# Patient Record
Sex: Male | Born: 1950
Health system: Southern US, Community
[De-identification: ages and names within clinical notes are randomized; demographics above are authoritative.]

## PROBLEM LIST (undated history)

## (undated) DIAGNOSIS — M316 Other giant cell arteritis: Secondary | ICD-10-CM

## (undated) DIAGNOSIS — K209 Esophagitis, unspecified without bleeding: Secondary | ICD-10-CM

## (undated) DIAGNOSIS — I10 Essential (primary) hypertension: Secondary | ICD-10-CM

## (undated) DIAGNOSIS — B159 Hepatitis A without hepatic coma: Secondary | ICD-10-CM

## (undated) DIAGNOSIS — J40 Bronchitis, not specified as acute or chronic: Secondary | ICD-10-CM

## (undated) DIAGNOSIS — E785 Hyperlipidemia, unspecified: Secondary | ICD-10-CM

## (undated) DIAGNOSIS — R7301 Impaired fasting glucose: Secondary | ICD-10-CM

## (undated) DIAGNOSIS — K649 Unspecified hemorrhoids: Secondary | ICD-10-CM

## (undated) DIAGNOSIS — J329 Chronic sinusitis, unspecified: Secondary | ICD-10-CM

## (undated) HISTORY — DX: Unspecified hemorrhoids: K64.9

## (undated) HISTORY — DX: Chronic sinusitis, unspecified: J32.9

## (undated) HISTORY — DX: Hyperlipidemia, unspecified: E78.5

## (undated) HISTORY — PX: ANUS SURGERY: SHX302

## (undated) HISTORY — DX: Esophagitis, unspecified: K20.9

## (undated) HISTORY — DX: Essential (primary) hypertension: I10

## (undated) HISTORY — DX: Esophagitis, unspecified without bleeding: K20.90

## (undated) HISTORY — DX: Impaired fasting glucose: R73.01

## (undated) HISTORY — DX: Hepatitis a without hepatic coma: B15.9

---
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
## 1995-04-19 HISTORY — PX: ACHILLES TENDON SURGERY: SHX542

## 2012-11-14 ENCOUNTER — Ambulatory Visit: Payer: Self-pay | Admitting: Family Medicine

## 2012-11-16 ENCOUNTER — Other Ambulatory Visit: Payer: Self-pay | Admitting: Specialist

## 2014-01-09 LAB — HM COLONOSCOPY

## 2014-08-07 ENCOUNTER — Ambulatory Visit
Admit: 2014-08-07 | Disposition: A | Discharge status: Home / Self Care | Payer: Self-pay | Attending: Family Medicine | Admitting: Family Medicine

## 2014-08-08 ENCOUNTER — Ambulatory Visit
Admit: 2014-08-08 | Disposition: A | Discharge status: Home / Self Care | Payer: Self-pay | Attending: Family Medicine | Admitting: Family Medicine

## 2015-02-09 ENCOUNTER — Encounter: Payer: Self-pay | Admitting: Emergency Medicine

## 2015-02-09 ENCOUNTER — Ambulatory Visit
Admission: EM | Admit: 2015-02-09 | Discharge: 2015-02-09 | Disposition: A | Discharge status: Home / Self Care | Payer: BLUE CROSS/BLUE SHIELD | Attending: Family Medicine | Admitting: Family Medicine

## 2015-02-09 ENCOUNTER — Telehealth: Payer: Self-pay

## 2015-02-09 DIAGNOSIS — B349 Viral infection, unspecified: Secondary | ICD-10-CM | POA: Diagnosis not present

## 2015-02-09 NOTE — Telephone Encounter (Signed)
I am so sorry, I just now saw this at 4:19 pm; please let him know I hope he feels better, but I want him to go to urgent care NOW where they can do a stat chest xray and CBC

## 2015-02-09 NOTE — Telephone Encounter (Signed)
Dr. Sherie DonLada, please advise. You have a total of 9 patients for this afternoon.

## 2015-02-09 NOTE — ED Provider Notes (Signed)
CSN: [id number]     Arrival date & time 02/09/15  1700 History   First MD Initiated Contact with Patient 02/09/15 1832     Chief Complaint  Patient presents with  . URI   (Consider location/radiation/quality/duration/timing/severity/associated sxs/prior Treatment) HPI Comments: 64 yo male with a 4 days h/o bodyaches, fevers, chills, nasal congestion, cough. Denies any chest pains or shortness of breath. States today started feeling slightly better and no fever today.   The history is provided by the patient.    Past Medical History  Diagnosis Date  . Hepatitis C    Past Surgical History  Procedure Laterality Date  . Tonsillectomy    . Achilles tendon surgery    . Av fistula repair     History reviewed. No pertinent family history. Social History  Substance Use Topics  . Smoking status: Never Smoker   . Smokeless tobacco: None  . Alcohol Use: No    Review of Systems  Allergies  Penicillins  Home Medications   Prior to Admission medications   Not on File   Meds Ordered and Administered this Visit  Medications - No data to display  BP 115/74 mmHg  Pulse 87  Temp(Src) 99.2 F (37.3 C) (Tympanic)  Resp 18  Ht 6' (1.829 m)  Wt 210 lb (95.255 kg)  BMI 28.47 kg/m2  SpO2 100% No data found.   Physical Exam  Constitutional: He appears well-developed and well-nourished. No distress.  HENT:  Head: Normocephalic and atraumatic.  Right Ear: Tympanic membrane, external ear and ear canal normal.  Left Ear: Tympanic membrane, external ear and ear canal normal.  Nose: Mucosal edema and rhinorrhea present.  Mouth/Throat: Uvula is midline and mucous membranes are normal. Posterior oropharyngeal erythema present. No oropharyngeal exudate, posterior oropharyngeal edema or tonsillar abscesses.  Eyes: Conjunctivae and EOM are normal. Pupils are equal, round, and reactive to light. Right eye exhibits no discharge. Left eye exhibits no discharge. No scleral icterus.  Neck:  Normal range of motion. Neck supple. No tracheal deviation present. No thyromegaly present.  Cardiovascular: Normal rate, regular rhythm and normal heart sounds.   Pulmonary/Chest: Effort normal and breath sounds normal. No stridor. No respiratory distress. He has no wheezes. He has no rales. He exhibits no tenderness.  Lymphadenopathy:    He has no cervical adenopathy.  Neurological: He is alert.  Skin: Skin is warm and dry. No rash noted. He is not diaphoretic.  Nursing note and vitals reviewed.   ED Course  Procedures (including critical care time)  Labs Review Labs Reviewed - No data to display  Imaging Review No results found.   Visual Acuity Review  Right Eye Distance:   Left Eye Distance:   Bilateral Distance:    Right Eye Near:   Left Eye Near:    Bilateral Near:         MDM   1. Viral syndrome    There are no discharge medications for this patient. 1. diagnosis reviewed with patient/parent/guardian/family 2. Recommend supportive treatment with otc cold/cough medications, otc analgesics, rest, fluids 3. Follow prn if symptoms worsen or don't improve    Payton Mccallumrlando Ahmir Bracken, MD 02/10/15 [phone]

## 2015-02-09 NOTE — Telephone Encounter (Signed)
Shane Duncan notified patient to go to urgent care.

## 2015-02-09 NOTE — Telephone Encounter (Signed)
Patient would like to know if he can be worked in today as he feels like he has pneumonia.  I offered the patient an appointment at 9:45am tomorrow morning but he wants to see Dr. Sherie DonLada today.  Please call patient to let him know if Dr. Sherie DonLada is willing to "work him in" today.  He can be reached at [phone].

## 2015-02-09 NOTE — ED Notes (Signed)
Fever 102, cough, chest congestion, headache for 4 days

## 2016-01-18 ENCOUNTER — Encounter: Payer: Self-pay | Admitting: Family Medicine

## 2016-01-18 ENCOUNTER — Ambulatory Visit (INDEPENDENT_AMBULATORY_CARE_PROVIDER_SITE_OTHER): Payer: BLUE CROSS/BLUE SHIELD | Admitting: Family Medicine

## 2016-01-18 VITALS — BP 136/90 | HR 85 | Temp 98.7°F | Ht 70.4 in | Wt 218.0 lb

## 2016-01-18 DIAGNOSIS — Z125 Encounter for screening for malignant neoplasm of prostate: Secondary | ICD-10-CM | POA: Diagnosis not present

## 2016-01-18 DIAGNOSIS — I10 Essential (primary) hypertension: Secondary | ICD-10-CM

## 2016-01-18 DIAGNOSIS — Z6830 Body mass index (BMI) 30.0-30.9, adult: Secondary | ICD-10-CM | POA: Diagnosis not present

## 2016-01-18 DIAGNOSIS — R7301 Impaired fasting glucose: Secondary | ICD-10-CM

## 2016-01-18 DIAGNOSIS — E669 Obesity, unspecified: Secondary | ICD-10-CM | POA: Diagnosis not present

## 2016-01-18 DIAGNOSIS — E785 Hyperlipidemia, unspecified: Secondary | ICD-10-CM

## 2016-01-18 DIAGNOSIS — E66811 Obesity, class 1: Secondary | ICD-10-CM

## 2016-01-18 DIAGNOSIS — E119 Type 2 diabetes mellitus without complications: Secondary | ICD-10-CM | POA: Insufficient documentation

## 2016-01-18 LAB — UA/M W/RFLX CULTURE, ROUTINE
BILIRUBIN UA: NEGATIVE
GLUCOSE, UA: NEGATIVE
Ketones, UA: NEGATIVE
Leukocytes, UA: NEGATIVE
Nitrite, UA: NEGATIVE
PROTEIN UA: NEGATIVE
RBC, UA: NEGATIVE
SPEC GRAV UA: 1.02 (ref 1.005–1.030)
Urobilinogen, Ur: 0.2 mg/dL (ref 0.2–1.0)
pH, UA: 5 (ref 5.0–7.5)

## 2016-01-18 LAB — MICROALBUMIN, URINE WAIVED
CREATININE, URINE WAIVED: 200 mg/dL (ref 10–300)
MICROALB, UR WAIVED: 10 mg/L (ref 0–19)

## 2016-01-18 NOTE — Assessment & Plan Note (Signed)
Rechecking levels today. Await results.  

## 2016-01-18 NOTE — Assessment & Plan Note (Signed)
Has never been on medication before. Usually under good control. Will continue to monitor. DASH guidelines given. Recheck in about a month at physical.

## 2016-01-18 NOTE — Patient Instructions (Addendum)
DASH Eating Plan  DASH stands for "Dietary Approaches to Stop Hypertension." The DASH eating plan is a healthy eating plan that has been shown to reduce high blood pressure (hypertension). Additional health benefits may include reducing the risk of type 2 diabetes mellitus, heart disease, and stroke. The DASH eating plan may also help with weight loss.  WHAT DO I NEED TO KNOW ABOUT THE DASH EATING PLAN?  For the DASH eating plan, you will follow these general guidelines:  · Choose foods with a percent daily value for sodium of less than 5% (as listed on the food label).  · Use salt-free seasonings or herbs instead of table salt or sea salt.  · Check with your health care provider or pharmacist before using salt substitutes.  · Eat lower-sodium products, often labeled as "lower sodium" or "no salt added."  · Eat fresh foods.  · Eat more vegetables, fruits, and low-fat dairy products.  · Choose whole grains. Look for the word "whole" as the first word in the ingredient list.  · Choose fish and skinless chicken or turkey more often than red meat. Limit fish, poultry, and meat to 6 oz (170 g) each day.  · Limit sweets, desserts, sugars, and sugary drinks.  · Choose heart-healthy fats.  · Limit cheese to 1 oz (28 g) per day.  · Eat more home-cooked food and less restaurant, buffet, and fast food.  · Limit fried foods.  · Cook foods using methods other than frying.  · Limit canned vegetables. If you do use them, rinse them well to decrease the sodium.  · When eating at a restaurant, ask that your food be prepared with less salt, or no salt if possible.  WHAT FOODS CAN I EAT?  Seek help from a dietitian for individual calorie needs.  Grains  Whole grain or whole wheat bread. Brown rice. Whole grain or whole wheat pasta. Quinoa, bulgur, and whole grain cereals. Low-sodium cereals. Corn or whole wheat flour tortillas. Whole grain cornbread. Whole grain crackers. Low-sodium crackers.  Vegetables  Fresh or frozen vegetables  (raw, steamed, roasted, or grilled). Low-sodium or reduced-sodium tomato and vegetable juices. Low-sodium or reduced-sodium tomato sauce and paste. Low-sodium or reduced-sodium canned vegetables.   Fruits  All fresh, canned (in natural juice), or frozen fruits.  Meat and Other Protein Products  Ground beef (85% or leaner), grass-fed beef, or beef trimmed of fat. Skinless chicken or turkey. Ground chicken or turkey. Pork trimmed of fat. All fish and seafood. Eggs. Dried beans, peas, or lentils. Unsalted nuts and seeds. Unsalted canned beans.  Dairy  Low-fat dairy products, such as skim or 1% milk, 2% or reduced-fat cheeses, low-fat ricotta or cottage cheese, or plain low-fat yogurt. Low-sodium or reduced-sodium cheeses.  Fats and Oils  Tub margarines without trans fats. Light or reduced-fat mayonnaise and salad dressings (reduced sodium). Avocado. Safflower, olive, or canola oils. Natural peanut or almond butter.  Other  Unsalted popcorn and pretzels.  The items listed above may not be a complete list of recommended foods or beverages. Contact your dietitian for more options.  WHAT FOODS ARE NOT RECOMMENDED?  Grains  White bread. White pasta. White rice. Refined cornbread. Bagels and croissants. Crackers that contain trans fat.  Vegetables  Creamed or fried vegetables. Vegetables in a cheese sauce. Regular canned vegetables. Regular canned tomato sauce and paste. Regular tomato and vegetable juices.  Fruits  Dried fruits. Canned fruit in light or heavy syrup. Fruit juice.  Meat and Other Protein   Products  Fatty cuts of meat. Ribs, chicken wings, bacon, sausage, bologna, salami, chitterlings, fatback, hot dogs, bratwurst, and packaged luncheon meats. Salted nuts and seeds. Canned beans with salt.  Dairy  Whole or 2% milk, cream, half-and-half, and cream cheese. Whole-fat or sweetened yogurt. Full-fat cheeses or blue cheese. Nondairy creamers and whipped toppings. Processed cheese, cheese spreads, or cheese  curds.  Condiments  Onion and garlic salt, seasoned salt, table salt, and sea salt. Canned and packaged gravies. Worcestershire sauce. Tartar sauce. Barbecue sauce. Teriyaki sauce. Soy sauce, including reduced sodium. Steak sauce. Fish sauce. Oyster sauce. Cocktail sauce. Horseradish. Ketchup and mustard. Meat flavorings and tenderizers. Bouillon cubes. Hot sauce. Tabasco sauce. Marinades. Taco seasonings. Relishes.  Fats and Oils  Butter, stick margarine, lard, shortening, ghee, and bacon fat. Coconut, palm kernel, or palm oils. Regular salad dressings.  Other  Pickles and olives. Salted popcorn and pretzels.  The items listed above may not be a complete list of foods and beverages to avoid. Contact your dietitian for more information.  WHERE CAN I FIND MORE INFORMATION?  National Heart, [employer], [employer] Institute: www.nhlbi.nih.gov/health/health-topics/topics/dash/     This information is not intended to replace advice given to you by your health care provider. Make sure you discuss any questions you have with your health care provider.     Document Released: 03/24/2011 Document Revised: 04/25/2014 Document Reviewed: 02/06/2013  Elsevier Interactive Patient Education ©2016 Elsevier Inc.

## 2016-01-18 NOTE — Progress Notes (Signed)
BP 136/90   Pulse 85   Temp 98.7 F (37.1 C)   Ht 5' 10.4" (1.788 m)   Wt 218 lb (98.9 kg)   SpO2 97%   BMI 30.93 kg/m    Subjective:    Patient ID: Shane Duncan, male    DOB: 1951/01/04, 65 y.o.   MRN: [id number]  HPI: Shane Duncan is a 65 y.o. male  Chief Complaint  Patient presents with  . Hypertension   ELEVATED BLOOD PRESSURE- has been checking [facility] BP at [facility]- has been running in the 195/125 at [facility] on Friday, 155/85 at [facility] on Saturday, has been feeling funny for the past 3 weeks Duration of elevated BP: chronic BP monitoring frequency: rarely Previous BP meds: no Recent stressors: no Family history of hypertension: yes Recurrent headaches: yes- due to the ragweed Visual changes: no Palpitations: yes  Dyspnea: no Chest pain: no Lower extremity edema: no Dizzy/lightheaded: yes Transient ischemic attacks: no  Relevant past medical, surgical, family and social history reviewed and updated as indicated. Interim medical history since our last visit reviewed. Allergies and medications reviewed and updated.  Review of Systems  Constitutional: Negative.   Respiratory: Negative.   Cardiovascular: Negative.     Per HPI unless specifically indicated above     Objective:    BP 136/90   Pulse 85   Temp 98.7 F (37.1 C)   Ht 5' 10.4" (1.788 m)   Wt 218 lb (98.9 kg)   SpO2 97%   BMI 30.93 kg/m   Wt Readings from Last 3 Encounters:  01/18/16 218 lb (98.9 kg)  02/09/15 210 lb (95.3 kg)    Physical Exam  Constitutional: He is oriented to person, place, and time. He appears well-developed and well-nourished. No distress.  HENT:  Head: Normocephalic and atraumatic.  Right Ear: Hearing normal.  Left Ear: Hearing normal.  Nose: Nose normal.  Eyes: Conjunctivae and lids are normal. Right eye exhibits no discharge. Left eye exhibits no discharge. No scleral icterus.  Cardiovascular: Normal rate, regular rhythm, normal heart sounds and intact distal pulses.   Exam reveals no gallop and no friction rub.   No murmur heard. Pulmonary/Chest: Effort normal and breath sounds normal. No respiratory distress. He has no wheezes. He has no rales. He exhibits no tenderness.  Musculoskeletal: Normal range of motion.  Neurological: He is alert and oriented to person, place, and time.  Skin: Skin is warm, dry and intact. No rash noted. He is not diaphoretic. No erythema. No pallor.  Psychiatric: He has a normal mood and affect. His speech is normal and behavior is normal. Judgment and thought content normal. Cognition and memory are normal.  Nursing note and vitals reviewed.   Results for orders placed or performed in visit on 01/18/16  HM COLONOSCOPY  Result Value Ref Range   HM Colonoscopy See Report (in chart) See Report (in chart), Patient Reported      Assessment & Plan:   Problem List Items Addressed This Visit      Cardiovascular and Mediastinum   Hypertension - Primary    Has never been on medication before. Usually under good control. Will continue to monitor. DASH guidelines given. Recheck in about a month at physical.      Relevant Medications   aspirin EC 81 MG tablet   Other Relevant Orders   CBC with Differential/Platelet   Comprehensive metabolic panel   Microalbumin, Urine Waived   TSH   UA/M w/rflx Culture, Routine  Endocrine   IFG (impaired fasting glucose)    Rechecking levels today. Await results.       Relevant Orders   CBC with Differential/Platelet   Comprehensive metabolic panel   Microalbumin, Urine Waived   TSH   Hgb A1c w/o eAG     Other   Hyperlipidemia    Rechecking levels today. Await results.       Relevant Medications   aspirin EC 81 MG tablet   Other Relevant Orders   CBC with Differential/Platelet   Comprehensive metabolic panel   Lipid Panel w/o Chol/HDL Ratio   TSH    Other Visit Diagnoses    Class 1 obesity with body mass index (BMI) of 30.0 to 30.9 in adult, unspecified obesity type,  unspecified whether serious comorbidity present       Checking labs. Continue diet and exercise. Call with any concerns.    Relevant Orders   CBC with Differential/Platelet   Comprehensive metabolic panel   TSH   Screening for prostate cancer       Labs checked today. Await results.    Relevant Orders   PSA       Follow up plan: Return in about 4 weeks (around 02/15/2016) for Physical.

## 2016-01-19 ENCOUNTER — Encounter: Payer: Self-pay | Admitting: Family Medicine

## 2016-01-19 LAB — LIPID PANEL W/O CHOL/HDL RATIO
Cholesterol, Total: 181 mg/dL (ref 100–199)
HDL: 42 mg/dL (ref >39)
LDL Calculated: 123 mg/dL — ABNORMAL HIGH (ref 0–99)
Triglycerides: 80 mg/dL (ref 0–149)
VLDL CHOLESTEROL CAL: 16 mg/dL (ref 5–40)

## 2016-01-19 LAB — COMPREHENSIVE METABOLIC PANEL
A/G RATIO: 1.8 (ref 1.2–2.2)
ALBUMIN: 4.1 g/dL (ref 3.6–4.8)
ALT: 11 IU/L (ref 0–44)
AST: 16 IU/L (ref 0–40)
Alkaline Phosphatase: 80 IU/L (ref 39–117)
BUN/Creatinine Ratio: 16 (ref 10–24)
BUN: 15 mg/dL (ref 8–27)
Bilirubin Total: 0.5 mg/dL (ref 0.0–1.2)
CO2: 21 mmol/L (ref 18–29)
Calcium: 9.4 mg/dL (ref 8.6–10.2)
Chloride: 105 mmol/L (ref 96–106)
Creatinine, Ser: 0.93 mg/dL (ref 0.76–1.27)
GFR calc non Af Amer: 87 mL/min/{1.73_m2} (ref >59)
GFR, EST AFRICAN AMERICAN: 101 mL/min/{1.73_m2} (ref >59)
GLUCOSE: 108 mg/dL — AB (ref 65–99)
Globulin, Total: 2.3 g/dL (ref 1.5–4.5)
Potassium: 4.3 mmol/L (ref 3.5–5.2)
SODIUM: 140 mmol/L (ref 134–144)
TOTAL PROTEIN: 6.4 g/dL (ref 6.0–8.5)

## 2016-01-19 LAB — CBC WITH DIFFERENTIAL/PLATELET
BASOS ABS: 0 10*3/uL (ref 0.0–0.2)
Basos: 0 %
EOS (ABSOLUTE): 0.2 10*3/uL (ref 0.0–0.4)
Eos: 5 %
HEMOGLOBIN: 14.8 g/dL (ref 12.6–17.7)
Hematocrit: 44.1 % (ref 37.5–51.0)
IMMATURE GRANS (ABS): 0 10*3/uL (ref 0.0–0.1)
Immature Granulocytes: 0 %
LYMPHS: 29 %
Lymphocytes Absolute: 1.3 10*3/uL (ref 0.7–3.1)
MCH: 28 pg (ref 26.6–33.0)
MCHC: 33.6 g/dL (ref 31.5–35.7)
MCV: 84 fL (ref 79–97)
Monocytes Absolute: 0.4 10*3/uL (ref 0.1–0.9)
Monocytes: 8 %
Neutrophils Absolute: 2.6 10*3/uL (ref 1.4–7.0)
Neutrophils: 58 %
Platelets: 233 10*3/uL (ref 150–379)
RBC: 5.28 x10E6/uL (ref 4.14–5.80)
RDW: 12.9 % (ref 12.3–15.4)
WBC: 4.6 10*3/uL (ref 3.4–10.8)

## 2016-01-19 LAB — TSH: TSH: 1.94 u[IU]/mL (ref 0.450–4.500)

## 2016-01-19 LAB — PSA: PROSTATE SPECIFIC AG, SERUM: 2.9 ng/mL (ref 0.0–4.0)

## 2016-01-19 LAB — HGB A1C W/O EAG: Hgb A1c MFr Bld: 5.9 % — ABNORMAL HIGH (ref 4.8–5.6)

## 2017-12-01 DIAGNOSIS — J019 Acute sinusitis, unspecified: Secondary | ICD-10-CM | POA: Diagnosis not present

## 2017-12-12 ENCOUNTER — Ambulatory Visit (INDEPENDENT_AMBULATORY_CARE_PROVIDER_SITE_OTHER): Payer: BLUE CROSS/BLUE SHIELD | Admitting: Physician Assistant

## 2017-12-12 ENCOUNTER — Encounter: Payer: Self-pay | Admitting: Physician Assistant

## 2017-12-12 DIAGNOSIS — R51 Headache: Secondary | ICD-10-CM

## 2017-12-12 DIAGNOSIS — R519 Headache, unspecified: Secondary | ICD-10-CM

## 2017-12-12 NOTE — Patient Instructions (Signed)
Analgesic Rebound Headache An analgesic rebound headache, sometimes called a medication overuse headache, is a headache that comes after pain medicine (analgesic) taken to treat the original (primary) headache has worn off. Any type of primary headache can return as a rebound headache if a person regularly takes analgesics more than three times a week to treat it. The types of primary headaches that are commonly associated with rebound headaches include:  Migraines.  Headaches that arise from tense muscles in the head and neck area (tension headaches).  Headaches that develop and happen again (recur) on one side of the head and around the eye (cluster headaches).  If rebound headaches continue, they become chronic daily headaches. What are the causes? This condition may be caused by frequent use of:  Over-the-counter medicines such as aspirin, ibuprofen, and acetaminophen.  Sinus relief medicines and other medicines that contain caffeine.  Narcotic pain medicines such as codeine and oxycodone.  What are the signs or symptoms? The symptoms of a rebound headache are the same as the symptoms of the original headache. Some of the symptoms of specific types of headaches include: Migraine headache  Pulsing or throbbing pain on one or both sides of the head.  Severe pain that interferes with daily activities.  Pain that is worsened by physical activity.  Nausea, vomiting, or both.  Pain with exposure to bright light, loud noises, or strong smells.  General sensitivity to bright light, loud noises, or strong smells.  Visual changes.  Numbness of one or both arms. Tension headache  Pressure around the head.  Dull, aching head pain.  Pain felt over the front and sides of the head.  Tenderness in the muscles of the head, neck, and shoulders. Cluster headache  Severe pain that begins in or around one eye or temple.  Redness and tearing in the eye on the same side as the  pain.  Droopy or swollen eyelid.  One-sided head pain.  Nausea.  Runny nose.  Sweaty, pale facial skin.  Restlessness. How is this diagnosed? This condition is diagnosed by:  Reviewing your medical history. This includes the nature of your primary headaches.  Reviewing the types of pain medicines that you have been using to treat your headaches and how often you take them.  How is this treated? This condition may be treated or managed by:  Discontinuing frequent use of the analgesic medicine. Doing this may worsen your headaches at first, but the pain should eventually become more manageable, less frequent, and less severe.  Seeing a headache specialist. He or she may be able to help you manage your headaches and help make sure there is not another cause of the headaches.  Using methods of stress relief, such as acupuncture, counseling, biofeedback, and massage. Talk with your health care provider about which methods might be good for you.  Follow these instructions at home:  Take over-the-counter and prescription medicines only as told by your health care provider.  Stop the repeated use of pain medicine as told by your health care provider. Stopping can be difficult. Carefully follow instructions from your health care provider.  Avoid triggers that are known to cause your primary headaches.  Keep all follow-up visits as told by your health care provider. This is important. Contact a health care provider if:  You continue to experience headaches after following treatments that your health care provider recommended. Get help right away if:  You develop new headache pain.  You develop headache pain that is different   than what you have experienced in the past.  You develop numbness or tingling in your arms or legs.  You develop changes in your speech or vision. This information is not intended to replace advice given to you by your health care provider. Make sure you  discuss any questions you have with your health care provider. Document Released: 06/25/2003 Document Revised: 10/23/2015 Document Reviewed: 09/07/2015 Elsevier Interactive Patient Education  2018 Elsevier Inc.  

## 2017-12-12 NOTE — Progress Notes (Signed)
Subjective:    Patient ID: Shane Duncan, male    DOB: [birth year], 67 y.o.   MRN: [id number]  Shane Duncan is a 67 y.o. male presenting on 12/12/2017 for Headache (off and on for 2 to 3 months )   HPI   Reports sinus headache x 2 months. Reports he hasn't had headaches since 1985. Has been using flonase daily since May, helped nasal symptoms but not headaches. Headaches last continuously, across both sides of forehead. Describes headaches as bitemporal as well, says it feels like he had the flu. No change in vision, No weakness. No nausea or vomiting with headaches, not pulsating quality.   He had a MRI head for syncope in 2016 which showed mild chronic sinusitis and left mastoid sinus effusion. He has been taking nyquil and dayquil.   Takes ibuprofen "some" but not some for 2 weeks. Ibuprofen 800 mg helped the headache. Does not take Tylenol.   BP Readings from Last 3 Encounters:  12/12/17 128/72  01/18/16 136/90  02/09/15 115/74     Social History   Tobacco Use  . Smoking status: Never Smoker  . Smokeless tobacco: Never Used  Substance Use Topics  . Alcohol use: Yes    Alcohol/week: 2.0 - 3.0 standard drinks    Types: 2 - 3 Cans of beer per week  . Drug use: Yes    Types: Marijuana    Review of Systems Per HPI unless specifically indicated above     Objective:    BP 128/72   Pulse 76   Temp 98.6 F (37 C) (Oral)   Ht 5' 10.75" (1.797 m)   Wt 206 lb 12.8 oz (93.8 kg)   SpO2 99%   BMI 29.05 kg/m   Wt Readings from Last 3 Encounters:  12/12/17 206 lb 12.8 oz (93.8 kg)  01/18/16 218 lb (98.9 kg)  02/09/15 210 lb (95.3 kg)    Physical Exam  Constitutional: He is oriented to person, place, and time. He appears well-developed and well-nourished.  Cardiovascular: Normal rate and regular rhythm.  Pulmonary/Chest: Effort normal and breath sounds normal.  Neurological: He is alert and oriented to person, place, and time. He has normal strength. He displays  normal reflexes. No cranial nerve deficit. Gait normal. GCS eye subscore is 4. GCS verbal subscore is 5. GCS motor subscore is 6.  Reflex Scores:      Bicep reflexes are 2+ on the right side and 2+ on the left side.      Patellar reflexes are 2+ on the right side and 2+ on the left side.      Achilles reflexes are 2+ on the right side and 2+ on the left side. Psychiatric: He has a normal mood and affect. His behavior is normal.   Results for orders placed or performed in visit on 01/18/16  CBC with Differential/Platelet  Result Value Ref Range   WBC 4.6 3.4 - 10.8 x10E3/uL   RBC 5.28 4.14 - 5.80 x10E6/uL   Hemoglobin 14.8 12.6 - 17.7 g/dL   Hematocrit 40.9 81.1 - 51.0 %   MCV 84 79 - 97 fL   MCH 28.0 26.6 - 33.0 pg   MCHC 33.6 31.5 - 35.7 g/dL   RDW 91.4 78.2 - 95.6 %   Platelets 233 150 - 379 x10E3/uL   Neutrophils 58 Not Estab. %   Lymphs 29 Not Estab. %   Monocytes 8 Not Estab. %   Eos 5 Not Estab. %  Basos 0 Not Estab. %   Neutrophils Absolute 2.6 1.4 - 7.0 x10E3/uL   Lymphocytes Absolute 1.3 0.7 - 3.1 x10E3/uL   Monocytes Absolute 0.4 0.1 - 0.9 x10E3/uL   EOS (ABSOLUTE) 0.2 0.0 - 0.4 x10E3/uL   Basophils Absolute 0.0 0.0 - 0.2 x10E3/uL   Immature Granulocytes 0 Not Estab. %   Immature Grans (Abs) 0.0 0.0 - 0.1 x10E3/uL  Comprehensive metabolic panel  Result Value Ref Range   Glucose 108 (H) 65 - 99 mg/dL   BUN 15 8 - 27 mg/dL   Creatinine, Ser 6.960.93 0.76 - 1.27 mg/dL   GFR calc non Af Amer 87 >59 mL/min/1.73   GFR calc Af Amer 101 >59 mL/min/1.73   BUN/Creatinine Ratio 16 10 - 24   Sodium 140 134 - 144 mmol/L   Potassium 4.3 3.5 - 5.2 mmol/L   Chloride 105 96 - 106 mmol/L   CO2 21 18 - 29 mmol/L   Calcium 9.4 8.6 - 10.2 mg/dL   Total Protein 6.4 6.0 - 8.5 g/dL   Albumin 4.1 3.6 - 4.8 g/dL   Globulin, Total 2.3 1.5 - 4.5 g/dL   Albumin/Globulin Ratio 1.8 1.2 - 2.2   Bilirubin Total 0.5 0.0 - 1.2 mg/dL   Alkaline Phosphatase 80 39 - 117 IU/L   AST 16 0 - 40 IU/L     ALT 11 0 - 44 IU/L  Lipid Panel w/o Chol/HDL Ratio  Result Value Ref Range   Cholesterol, Total 181 100 - 199 mg/dL   Triglycerides 80 0 - 149 mg/dL   HDL 42 >29>39 mg/dL   VLDL Cholesterol Cal 16 5 - 40 mg/dL   LDL Calculated 528123 (H) 0 - 99 mg/dL  Microalbumin, Urine Waived  Result Value Ref Range   Microalb, Ur Waived 10 0 - 19 mg/L   Creatinine, Urine Waived 200 10 - 300 mg/dL   Microalb/Creat Ratio <30 <30 mg/g  TSH  Result Value Ref Range   TSH 1.940 0.450 - 4.500 uIU/mL  PSA  Result Value Ref Range   Prostate Specific Ag, Serum 2.9 0.0 - 4.0 ng/mL  UA/M w/rflx Culture, Routine  Result Value Ref Range   Specific Gravity, UA 1.020 1.005 - 1.030   pH, UA 5.0 5.0 - 7.5   Color, UA Yellow Yellow   Appearance Ur Clear Clear   Leukocytes, UA Negative Negative   Protein, UA Negative Negative/Trace   Glucose, UA Negative Negative   Ketones, UA Negative Negative   RBC, UA Negative Negative   Bilirubin, UA Negative Negative   Urobilinogen, Ur 0.2 0.2 - 1.0 mg/dL   Nitrite, UA Negative Negative  Hgb A1c w/o eAG  Result Value Ref Range   Hgb A1c MFr Bld 5.9 (H) 4.8 - 5.6 %  HM COLONOSCOPY  Result Value Ref Range   HM Colonoscopy See Report (in chart) See Report (in chart), Patient Reported      Assessment & Plan:  1. Nonintractable headache, unspecified chronicity pattern, unspecified headache type  Will check head CT as he is 67 and this is a new and different headache. In the meantime, he should continue flonase, take daily zyrtec and discontinue nyquil and dayquil.  - CT Head Wo Contrast; Future   Follow up plan: Return if symptoms worsen or fail to improve.  Osvaldo AngstAdriana Itzy Adler, PA-C [facility]  [facility] 12/13/2017, 1:09 PM

## 2017-12-14 DIAGNOSIS — J019 Acute sinusitis, unspecified: Secondary | ICD-10-CM | POA: Diagnosis not present

## 2017-12-15 ENCOUNTER — Telehealth: Payer: Self-pay

## 2017-12-15 NOTE — Telephone Encounter (Signed)
Copied from CRM [phone]#[id number]. Topic: General - Other >> Dec 15, 2017 11:24 AM Marshall CorkBullock, Wilfred Siverson L, CMA wrote: Called patient to give him his appointment information for his CT scan. Appointment Tuesday 12/19/2017 at 10:00AM (arrive at 9:45) [facility].  [facility] was the soonest I could get patient in. [facility] was booked weeks out.  PEC give information if patient calls for this appointment, or please call patient for appointment. >> Dec 15, 2017 11:37 AM Baldo DaubAlexander, Amber L wrote: Pt called to say that he has cancelled this appointment.  Pt states he just had a sinus infection and things have cleared up after being on antibiotics.

## 2017-12-15 NOTE — Telephone Encounter (Signed)
Routing to provider as FYI

## 2017-12-19 ENCOUNTER — Ambulatory Visit: Payer: Medicare Other

## 2017-12-20 DIAGNOSIS — R531 Weakness: Secondary | ICD-10-CM | POA: Diagnosis not present

## 2017-12-20 DIAGNOSIS — R5383 Other fatigue: Secondary | ICD-10-CM | POA: Diagnosis not present

## 2017-12-26 ENCOUNTER — Emergency Department
Admission: EM | Admit: 2017-12-26 | Discharge: 2017-12-26 | Disposition: A | Discharge status: Home / Self Care | Payer: Medicare Other | Attending: Student in an Organized Health Care Education/Training Program | Admitting: Student in an Organized Health Care Education/Training Program

## 2017-12-26 ENCOUNTER — Other Ambulatory Visit: Payer: Self-pay

## 2017-12-26 ENCOUNTER — Emergency Department: Payer: Medicare Other

## 2017-12-26 ENCOUNTER — Encounter: Payer: Self-pay | Admitting: Emergency Medicine

## 2017-12-26 DIAGNOSIS — R5383 Other fatigue: Secondary | ICD-10-CM | POA: Diagnosis present

## 2017-12-26 DIAGNOSIS — E785 Hyperlipidemia, unspecified: Secondary | ICD-10-CM | POA: Diagnosis not present

## 2017-12-26 DIAGNOSIS — R0602 Shortness of breath: Secondary | ICD-10-CM | POA: Diagnosis not present

## 2017-12-26 DIAGNOSIS — R634 Abnormal weight loss: Secondary | ICD-10-CM | POA: Diagnosis not present

## 2017-12-26 DIAGNOSIS — R531 Weakness: Secondary | ICD-10-CM | POA: Insufficient documentation

## 2017-12-26 DIAGNOSIS — Z79899 Other long term (current) drug therapy: Secondary | ICD-10-CM | POA: Diagnosis not present

## 2017-12-26 DIAGNOSIS — I10 Essential (primary) hypertension: Secondary | ICD-10-CM | POA: Insufficient documentation

## 2017-12-26 DIAGNOSIS — R079 Chest pain, unspecified: Secondary | ICD-10-CM | POA: Diagnosis not present

## 2017-12-26 DIAGNOSIS — J0120 Acute ethmoidal sinusitis, unspecified: Secondary | ICD-10-CM

## 2017-12-26 LAB — BASIC METABOLIC PANEL
ANION GAP: 10 (ref 5–15)
BUN: 12 mg/dL (ref 8–23)
CHLORIDE: 100 mmol/L (ref 98–111)
CO2: 24 mmol/L (ref 22–32)
Calcium: 8.8 mg/dL — ABNORMAL LOW (ref 8.9–10.3)
Creatinine, Ser: 1.05 mg/dL (ref 0.61–1.24)
GFR calc Af Amer: >60 mL/min (ref >60)
GFR calc non Af Amer: >60 mL/min (ref >60)
Glucose, Bld: 109 mg/dL — ABNORMAL HIGH (ref 70–99)
Potassium: 4 mmol/L (ref 3.5–5.1)
Sodium: 134 mmol/L — ABNORMAL LOW (ref 135–145)

## 2017-12-26 LAB — CBC
HCT: 34.6 % — ABNORMAL LOW (ref 40.0–52.0)
HEMOGLOBIN: 11.8 g/dL — AB (ref 13.0–18.0)
MCH: 26.9 pg (ref 26.0–34.0)
MCHC: 34.2 g/dL (ref 32.0–36.0)
MCV: 78.7 fL — AB (ref 80.0–100.0)
Platelets: 396 10*3/uL (ref 150–440)
RBC: 4.4 MIL/uL (ref 4.40–5.90)
RDW: 13.8 % (ref 11.5–14.5)
WBC: 7 10*3/uL (ref 3.8–10.6)

## 2017-12-26 LAB — TROPONIN I: Troponin I: <0.03 ng/mL (ref <0.03)

## 2017-12-26 MED ORDER — KETOROLAC TROMETHAMINE 30 MG/ML IJ SOLN
15.0000 mg | Freq: Once | INTRAMUSCULAR | Status: AC
  Administered 2017-12-26: 15 mg via INTRAVENOUS
  Filled 2017-12-26: qty 1

## 2017-12-26 MED ORDER — IOHEXOL 300 MG/ML  SOLN
75.0000 mL | Freq: Once | INTRAMUSCULAR | Status: AC | PRN
  Administered 2017-12-26: 75 mL via INTRAVENOUS

## 2017-12-26 MED ORDER — SODIUM CHLORIDE 0.9 % IV BOLUS
1000.0000 mL | Freq: Once | INTRAVENOUS | Status: AC
  Administered 2017-12-26: 1000 mL via INTRAVENOUS

## 2017-12-26 NOTE — ED Notes (Signed)
Said when he went up steps he gets pain in left chest an says in my lung.

## 2017-12-26 NOTE — ED Triage Notes (Signed)
Pt to ED with c/o of what he thought was a sinus infection. Seen by PCP and has had 2 rounds of ABX without relief.

## 2017-12-26 NOTE — ED Notes (Signed)
Patient says he has had sinus problems for about 4 months and is on 3rd antibiotic, but says he is "weak."  Says he gets weak after going up stairs, has night sweats and chills and has had recent wt loss of 10 pounds.  He has been going to urgent care.  They did blood work last week.  Told him he was anemic and to take iron.

## 2017-12-26 NOTE — ED Provider Notes (Signed)
[facility] Emergency Department Provider Note    First MD Initiated Contact with Patient 12/26/17 1506     (approximate)  I have reviewed the triage vital signs and the nursing notes.   HISTORY  Chief Complaint Weakness and headache   HPI Shane Duncan is a 67 y.o. male w/ recent treatment for sinusitis with three rounds of abx presents for c/c fatigue, intermittent HA, SOB and chest pain that started yesterday after he walked up a flight of stairs.  Patient still having nasal discharge and is noticing nights sweats for the past several days.  No numbness or tingle.  No chest pain.  Mild headache reported.  No hx of malignancy.  He does not smoke.  No tick bites.   States his appetite has been good.   Past Medical History:  Diagnosis Date  . Chronic sinusitis   . Esophagitis   . Hemorrhoids   . Hepatitis A   . Hyperlipidemia   . Hypertension   . IFG (impaired fasting glucose)    Family History  Problem Relation Age of Onset  . Cancer Father        Colon  . Hypertension Father   . Hyperlipidemia Father   . Ulcerative colitis Son    Past Surgical History:  Procedure Laterality Date  . ACHILLES TENDON SURGERY    . AV FISTULA REPAIR    . TONSILLECTOMY     Patient Active Problem List   Diagnosis Date Noted  . IFG (impaired fasting glucose)   . Hyperlipidemia   . Hypertension       Prior to Admission medications   Medication Sig Start Date End Date Taking? Authorizing Provider  fluticasone (FLONASE) 50 MCG/ACT nasal spray Place 2 sprays into both nostrils daily.    Yes [provider]  sulfamethoxazole-trimethoprim (BACTRIM DS,SEPTRA DS) 800-160 MG tablet Take 1 tablet by mouth 2 (two) times daily.   Yes [provider]    Allergies Penicillins    Social History Social History   Tobacco Use  . Smoking status: Never Smoker  . Smokeless tobacco: Never Used  Substance Use Topics  . Alcohol use: Yes   Alcohol/week: 2.0 - 3.0 standard drinks    Types: 2 - 3 Cans of beer per week  . Drug use: Yes    Types: Marijuana    Review of Systems Patient denies headaches, rhinorrhea, blurry vision, numbness, shortness of breath, chest pain, edema, cough, abdominal pain, nausea, vomiting, diarrhea, dysuria, fevers, rashes or hallucinations unless otherwise stated above in HPI. ____________________________________________   PHYSICAL EXAM:  VITAL SIGNS: Vitals:   12/26/17 1700 12/26/17 1713  BP: 121/65   Pulse: 72 73  Resp: 12 10  Temp:    SpO2: 100% 100%    Constitutional: Alert and oriented.  Eyes: Conjunctivae are normal.  Head: Atraumatic. Nose: No congestion/rhinnorhea. Mouth/Throat: Mucous membranes are moist.   Neck: No stridor. Painless ROM.  Cardiovascular: Normal rate, regular rhythm. Grossly normal heart sounds.  Good peripheral circulation. Respiratory: Normal respiratory effort.  No retractions. Lungs CTAB. Gastrointestinal: Soft and nontender. No distention. No abdominal bruits. No CVA tenderness. Genitourinary:  Musculoskeletal: No lower extremity tenderness nor edema.  No joint effusions. Neurologic:  CN- intact.  No facial droop, Normal FNF.  Normal heel to shin.  Sensation intact bilaterally. Normal speech and language. No gross focal neurologic deficits are appreciated. No gait instability. Skin:  Skin is warm, dry and intact. No rash noted. Psychiatric: Mood and affect  are normal. Speech and behavior are normal.  ____________________________________________   LABS (all labs ordered are listed, but only abnormal results are displayed)  Results for orders placed or performed during the hospital encounter of 12/26/17 (from the past 24 hour(s))  Basic metabolic panel     Status: Abnormal   Collection Time: 12/26/17  1:44 PM  Result Value Ref Range   Sodium 134 (L) 135 - 145 mmol/L   Potassium 4.0 3.5 - 5.1 mmol/L   Chloride 100 98 - 111 mmol/L   CO2 24 22 - 32  mmol/L   Glucose, Bld 109 (H) 70 - 99 mg/dL   BUN 12 8 - 23 mg/dL   Creatinine, Ser 4.78 0.61 - 1.24 mg/dL   Calcium 8.8 (L) 8.9 - 10.3 mg/dL   GFR calc non Af Amer >60 >60 mL/min   GFR calc Af Amer >60 >60 mL/min   Anion gap 10 5 - 15  CBC     Status: Abnormal   Collection Time: 12/26/17  1:44 PM  Result Value Ref Range   WBC 7.0 3.8 - 10.6 K/uL   RBC 4.40 4.40 - 5.90 MIL/uL   Hemoglobin 11.8 (L) 13.0 - 18.0 g/dL   HCT 29.5 (L) 62.1 - 30.8 %   MCV 78.7 (L) 80.0 - 100.0 fL   MCH 26.9 26.0 - 34.0 pg   MCHC 34.2 32.0 - 36.0 g/dL   RDW 65.7 84.6 - 96.2 %   Platelets 396 150 - 440 K/uL  Troponin I     Status: None   Collection Time: 12/26/17  1:44 PM  Result Value Ref Range   Troponin I <0.03 <0.03 ng/mL   ____________________________________________  EKG My review and personal interpretation at Time: 13:57   Indication: weakness  Rate: 70  Rhythm: sinus Axis: normal Other: normal intervals, no stemi ____________________________________________  RADIOLOGY  I personally reviewed all radiographic images ordered to evaluate for the above acute complaints and reviewed radiology reports and findings.  These findings were personally discussed with the patient.  Please see medical record for radiology report.  ____________________________________________   PROCEDURES  Procedure(s) performed:  Procedures    Critical Care performed: no ____________________________________________   INITIAL IMPRESSION / ASSESSMENT AND PLAN / ED COURSE  Pertinent labs & imaging results that were available during my care of the patient were reviewed by me and considered in my medical decision making (see chart for details).   DDX: dehydration, electrolyte abn, cvst, migraine, sinusitis, malignancy, unlikely stroke, acs orpulmonary disease pattern  Shane Duncan is a 67 y.o. who presents to the ED with sx as descrbied above.  Patient is AFVSS in ED. Exam as above. Given current presentation  have considered the above differential.  The patient will be placed on continuous pulse oximetry and telemetry for monitoring.  Laboratory evaluation will be sent to evaluate for the above complaints.  Based on the duration and severity of his symptoms in a previous healthy male, will give IVF, pain medication, order CT imaging for the above differential.  Clinical Course as of Dec 26 1717  Tue Dec 26, 2017  1649 No evidence of CVST.  Does have evidence of sinusitis.  Will give referral to ENT.     [PR]  1655 Patient has 3 now more days of Bactrim to complete.   [PR]  1718 Pain controlled. Patient was able to tolerate PO and was able to ambulate with a steady gait.  Have discussed with the patient and available family  all diagnostics and treatments performed thus far and all questions were answered to the best of my ability. The patient demonstrates understanding and agreement with plan.    [PR]    Clinical Course User Index [PR] Willy Eddy, MD     As part of my medical decision making, I reviewed the following data within the electronic MEDICAL RECORD NUMBER Nursing notes reviewed and incorporated, Labs reviewed, notes from prior ED visits.   ____________________________________________   FINAL CLINICAL IMPRESSION(S) / ED DIAGNOSES  Final diagnoses:  Subacute ethmoidal sinusitis  Weakness      NEW MEDICATIONS STARTED DURING THIS VISIT:  New Prescriptions   No medications on file     Note:  This document was prepared using Dragon voice recognition software and may include unintentional dictation errors.    Willy Eddy, MD 12/26/17 1719

## 2017-12-29 ENCOUNTER — Telehealth: Payer: Self-pay | Admitting: Family Medicine

## 2017-12-29 DIAGNOSIS — J0120 Acute ethmoidal sinusitis, unspecified: Secondary | ICD-10-CM

## 2017-12-29 NOTE — Telephone Encounter (Signed)
Contacted patient regarding ENT referral request. Patient states that he was seen at the ED and advised to schedule an appointment with ENT. Patient states that he has an appointment scheduled for Thursday, 9/19 but would like to get in sooner. Informed patient that Dr. Laural BenesJohnson reviewed his ED notes and scans and that she does not feel that his results require an urgent visit. I also advised the patient to call the ENT office to be added to their appointment cancellation list. Patient agreed to do so and thanked me for calling.

## 2017-12-29 NOTE — Telephone Encounter (Signed)
Needs referral to ENT- doesn't appear to have gone through from ER. Referral generated.

## 2018-01-01 DIAGNOSIS — J324 Chronic pansinusitis: Secondary | ICD-10-CM | POA: Diagnosis not present

## 2018-01-01 DIAGNOSIS — J329 Chronic sinusitis, unspecified: Secondary | ICD-10-CM | POA: Diagnosis not present

## 2018-01-01 DIAGNOSIS — J3489 Other specified disorders of nose and nasal sinuses: Secondary | ICD-10-CM | POA: Diagnosis not present

## 2018-01-01 DIAGNOSIS — J019 Acute sinusitis, unspecified: Secondary | ICD-10-CM | POA: Diagnosis not present

## 2018-01-03 ENCOUNTER — Encounter: Payer: Self-pay | Admitting: Family Medicine

## 2018-01-03 ENCOUNTER — Ambulatory Visit (INDEPENDENT_AMBULATORY_CARE_PROVIDER_SITE_OTHER): Payer: Medicare Other | Admitting: Family Medicine

## 2018-01-03 VITALS — BP 113/72 | HR 78 | Temp 98.5°F | Ht 70.3 in | Wt 201.2 lb

## 2018-01-03 DIAGNOSIS — R7301 Impaired fasting glucose: Secondary | ICD-10-CM | POA: Diagnosis not present

## 2018-01-03 DIAGNOSIS — I10 Essential (primary) hypertension: Secondary | ICD-10-CM | POA: Diagnosis not present

## 2018-01-03 DIAGNOSIS — E785 Hyperlipidemia, unspecified: Secondary | ICD-10-CM | POA: Diagnosis not present

## 2018-01-03 DIAGNOSIS — Z0000 Encounter for general adult medical examination without abnormal findings: Secondary | ICD-10-CM | POA: Diagnosis not present

## 2018-01-03 DIAGNOSIS — R3911 Hesitancy of micturition: Secondary | ICD-10-CM

## 2018-01-03 DIAGNOSIS — Z125 Encounter for screening for malignant neoplasm of prostate: Secondary | ICD-10-CM | POA: Diagnosis not present

## 2018-01-03 DIAGNOSIS — Z136 Encounter for screening for cardiovascular disorders: Secondary | ICD-10-CM | POA: Diagnosis not present

## 2018-01-03 LAB — UA/M W/RFLX CULTURE, ROUTINE
Bilirubin, UA: NEGATIVE
GLUCOSE, UA: NEGATIVE
Ketones, UA: NEGATIVE
Leukocytes, UA: NEGATIVE
Nitrite, UA: NEGATIVE
PH UA: 5.5 (ref 5.0–7.5)
PROTEIN UA: NEGATIVE
RBC UA: NEGATIVE
SPEC GRAV UA: 1.025 (ref 1.005–1.030)
Urobilinogen, Ur: 0.2 mg/dL (ref 0.2–1.0)

## 2018-01-03 LAB — BAYER DCA HB A1C WAIVED: HB A1C (BAYER DCA - WAIVED): 6 % (ref <7.0)

## 2018-01-03 NOTE — Assessment & Plan Note (Addendum)
Under good control off medicine with A1c of 6.0.  Continue diet and exercise. Continue to monitor. Call with any concerns.

## 2018-01-03 NOTE — Progress Notes (Signed)
BP 113/72 (BP Location: Left Arm, Patient Position: Sitting, Cuff Size: Normal)   Pulse 78   Temp 98.5 F (36.9 C)   Ht 5' 10.3" (1.786 m)   Wt 201 lb 3 oz (91.3 kg)   SpO2 99%   BMI 28.62 kg/m    Subjective:    Patient ID: Shane Duncan, male    DOB: 09-26-1950, 67 y.o.   MRN: [id number]  HPI: Shane Duncan is a 67 y.o. male presenting on 01/03/2018 for comprehensive medical examination. Current medical complaints include:  Having issues with his sinuses. He is following with ENT and is on his 4th antibiotic.   HYPERTENSION / HYPERLIPIDEMIA Satisfied with current treatment? yes Duration of hypertension: chronic BP monitoring frequency: not checking BP medication side effects: Not on anything Duration of hyperlipidemia: chronic Aspirin: no Recent stressors: no Recurrent headaches: yes Visual changes: no Palpitations: no Dyspnea: no Chest pain: no Lower extremity edema: no Dizzy/lightheaded: no  Impaired Fasting Glucose HbA1C:  Lab Results  Component Value Date   HGBA1C 5.9 (H) 01/18/2016   Duration of elevated blood sugar: chronic Polydipsia: no Polyuria: no Weight change: no Visual disturbance: no Glucose Monitoring: no Diabetic Education: Not Completed Family history of diabetes: yes  Interim Problems from his last visit: yes- bad headache, went to the ER for sinus infection last week, seeing ENT  Functional Status Survey: Is the patient deaf or have difficulty hearing?: No Does the patient have difficulty seeing, even when wearing glasses/contacts?: No Does the patient have difficulty concentrating, remembering, or making decisions?: No Does the patient have difficulty walking or climbing stairs?: Yes(chronic sinus  infection) Does the patient have difficulty dressing or bathing?: No Does the patient have difficulty doing errands alone such as visiting a doctor's office or shopping?: No  FALL RISK: Fall Risk  01/03/2018  Falls in the past year?  No    Depression Screen Depression screen PHQ 2/9 01/03/2018  Decreased Interest 0  Down, Depressed, Hopeless 0  PHQ - 2 Score 0    Advanced Directives Information given today  Past Medical History:  Past Medical History:  Diagnosis Date  . Chronic sinusitis   . Esophagitis   . Hemorrhoids   . Hepatitis A   . Hyperlipidemia   . Hypertension   . IFG (impaired fasting glucose)     Surgical History:  Past Surgical History:  Procedure Laterality Date  . ACHILLES TENDON SURGERY    . AV FISTULA REPAIR    . TONSILLECTOMY      Medications:  Current Outpatient Medications on File Prior to Visit  Medication Sig  . clarithromycin (BIAXIN) 500 MG tablet Take 500 mg by mouth 2 (two) times daily.   No current facility-administered medications on file prior to visit.     Allergies:  Allergies  Allergen Reactions  . Penicillins     Social History:  Social History   Socioeconomic History  . Marital status: Married    Spouse name: Not on file  . Number of children: Not on file  . Years of education: Not on file  . Highest education level: Not on file  Occupational History  . Not on file  Social Needs  . Financial resource strain: Not on file  . Food insecurity:    Worry: Not on file    Inability: Not on file  . Transportation needs:    Medical: Not on file    Non-medical: Not on file  Tobacco Use  . Smoking status:  Never Smoker  . Smokeless tobacco: Never Used  Substance and Sexual Activity  . Alcohol use: Yes    Alcohol/week: 2.0 - 3.0 standard drinks    Types: 2 - 3 Cans of beer per week  . Drug use: Yes    Types: Marijuana  . Sexual activity: Yes  Lifestyle  . Physical activity:    Days per week: Not on file    Minutes per session: Not on file  . Stress: Not on file  Relationships  . Social connections:    Talks on phone: Not on file    Gets together: Not on file    Attends religious service: Not on file    Active member of club or organization:  Not on file    Attends meetings of clubs or organizations: Not on file    Relationship status: Not on file  . Intimate partner violence:    Fear of current or ex partner: Not on file    Emotionally abused: Not on file    Physically abused: Not on file    Forced sexual activity: Not on file  Other Topics Concern  . Not on file  Social History Narrative  . Not on file   Social History   Tobacco Use  Smoking Status Never Smoker  Smokeless Tobacco Never Used   Social History   Substance and Sexual Activity  Alcohol Use Yes  . Alcohol/week: 2.0 - 3.0 standard drinks  . Types: 2 - 3 Cans of beer per week    Family History:  Family History  Problem Relation Age of Onset  . Cancer Father        Colon  . Hypertension Father   . Hyperlipidemia Father   . Ulcerative colitis Son     Past medical history, surgical history, medications, allergies, family history and social history reviewed with patient today and changes made to appropriate areas of the chart.   Review of Systems  Constitutional: Positive for diaphoresis and malaise/fatigue. Negative for chills, fever and weight loss.  HENT: Positive for congestion and sinus pain. Negative for ear discharge, ear pain, hearing loss, nosebleeds, sore throat and tinnitus.   Eyes: Negative.   Respiratory: Positive for cough and shortness of breath. Negative for hemoptysis, sputum production, wheezing and stridor.   Cardiovascular: Negative.   Gastrointestinal: Negative.   Genitourinary: Negative.   Musculoskeletal: Positive for joint pain. Negative for back pain, falls, myalgias and neck pain.  Skin: Negative.   Neurological: Positive for dizziness and headaches. Negative for tingling, tremors, sensory change, speech change, focal weakness, seizures, loss of consciousness and weakness.  Endo/Heme/Allergies: Negative.   Psychiatric/Behavioral: Negative.    All other ROS negative except what is listed above and in the HPI.        Objective:    BP 113/72 (BP Location: Left Arm, Patient Position: Sitting, Cuff Size: Normal)   Pulse 78   Temp 98.5 F (36.9 C)   Ht 5' 10.3" (1.786 m)   Wt 201 lb 3 oz (91.3 kg)   SpO2 99%   BMI 28.62 kg/m   Wt Readings from Last 3 Encounters:  01/03/18 201 lb 3 oz (91.3 kg)  12/26/17 205 lb 0.4 oz (93 kg)  12/12/17 206 lb 12.8 oz (93.8 kg)     Hearing Screening   125Hz  250Hz  500Hz  1000Hz  2000Hz  3000Hz  4000Hz  6000Hz  8000Hz   Right ear:   40 40 40  Fail    Left ear:   40 40 40  Fail  Visual Acuity Screening   Right eye Left eye Both eyes  Without correction: 20/50 20/25 20/25   With correction:      Physical Exam  Constitutional: He is oriented to person, place, and time. He appears well-developed and well-nourished. No distress.  HENT:  Head: Normocephalic and atraumatic.  Right Ear: Hearing and external ear normal.  Left Ear: Hearing and external ear normal.  Nose: Nose normal.  Mouth/Throat: Oropharynx is clear and moist. No oropharyngeal exudate.  Eyes: Pupils are equal, round, and reactive to light. Conjunctivae, EOM and lids are normal. Right eye exhibits no discharge. Left eye exhibits no discharge. No scleral icterus.  Neck: Normal range of motion. Neck supple. No JVD present. No tracheal deviation present. No thyromegaly present.  Cardiovascular: Normal rate, regular rhythm, normal heart sounds and intact distal pulses. Exam reveals no gallop and no friction rub.  No murmur heard. Pulmonary/Chest: Effort normal and breath sounds normal. No stridor. No respiratory distress. He has no wheezes. He has no rales. He exhibits no tenderness.  Abdominal: Soft. Bowel sounds are normal. He exhibits no distension and no mass. There is no tenderness. There is no rebound and no guarding. No hernia.  Genitourinary:  Genitourinary Comments: Penis and prostate exam deferred with shared decision making  Musculoskeletal: Normal range of motion. He exhibits no edema, tenderness  or deformity.  Lymphadenopathy:    He has no cervical adenopathy.  Neurological: He is alert and oriented to person, place, and time. He displays normal reflexes. No cranial nerve deficit or sensory deficit. He exhibits normal muscle tone. Coordination normal.  Skin: Skin is warm, dry and intact. Capillary refill takes less than 2 seconds. No rash noted. He is not diaphoretic. No erythema. No pallor.  Psychiatric: He has a normal mood and affect. His speech is normal and behavior is normal. Judgment and thought content normal. Cognition and memory are normal.  Nursing note and vitals reviewed.   No flowsheet data found.   Results for orders placed or performed during the hospital encounter of 12/26/17  Basic metabolic panel  Result Value Ref Range   Sodium 134 (L) 135 - 145 mmol/L   Potassium 4.0 3.5 - 5.1 mmol/L   Chloride 100 98 - 111 mmol/L   CO2 24 22 - 32 mmol/L   Glucose, Bld 109 (H) 70 - 99 mg/dL   BUN 12 8 - 23 mg/dL   Creatinine, Ser 1.61 0.61 - 1.24 mg/dL   Calcium 8.8 (L) 8.9 - 10.3 mg/dL   GFR calc non Af Amer >60 >60 mL/min   GFR calc Af Amer >60 >60 mL/min   Anion gap 10 5 - 15  CBC  Result Value Ref Range   WBC 7.0 3.8 - 10.6 K/uL   RBC 4.40 4.40 - 5.90 MIL/uL   Hemoglobin 11.8 (L) 13.0 - 18.0 g/dL   HCT 09.6 (L) 04.5 - 40.9 %   MCV 78.7 (L) 80.0 - 100.0 fL   MCH 26.9 26.0 - 34.0 pg   MCHC 34.2 32.0 - 36.0 g/dL   RDW 81.1 91.4 - 78.2 %   Platelets 396 150 - 440 K/uL  Troponin I  Result Value Ref Range   Troponin I <0.03 <0.03 ng/mL      Assessment & Plan:   Problem List Items Addressed This Visit      Cardiovascular and Mediastinum   Hypertension    Under good control off medicine.  Continue diet and exercise. Continue to monitor. Call with any concerns.  Relevant Orders   CBC with Differential/Platelet   Comprehensive metabolic panel   TSH   UA/M w/rflx Culture, Routine     Endocrine   IFG (impaired fasting glucose)    Under good  control off medicine with A1c of 6.0.  Continue diet and exercise. Continue to monitor. Call with any concerns.       Relevant Orders   Bayer DCA Hb A1c Waived   CBC with Differential/Platelet   Comprehensive metabolic panel   TSH   UA/M w/rflx Culture, Routine     Other   Hyperlipidemia    Rechecking levels today. Await results. Call with any concerns.       Relevant Orders   CBC with Differential/Platelet   Comprehensive metabolic panel   Lipid Panel w/o Chol/HDL Ratio   TSH   UA/M w/rflx Culture, Routine    Other Visit Diagnoses    Welcome to Medicare preventive visit    -  Primary   Preventative care discussed today as below.    Screening for prostate cancer       Checking labs today. Await results.    Hesitancy       Checking labs today. Await results.    Relevant Orders   PSA   UA/M w/rflx Culture, Routine   Screening for cardiovascular condition       EKG done last week in the ER and was normal. Checking labs today. Await results.        Preventative Services:  AAA screening: N/A Health Risk Assessment and Personalized Prevention Plan: Done today Bone Mass Measurements: N/A CVD Screening: Done today Colon Cancer Screening: UTD Depression Screening: Done today Diabetes Screening: Done today Glaucoma Screening: See your Eye Doctor Hepatitis B vaccine: N/A Hepatitis C screening: Refused HIV Screening: Refused Flu Vaccine: Refused Lung cancer Screening: N/A Obesity Screening: Done today Pneumonia Vaccines (2): Refused STI Screening: N/A PSA screening: Done today  LABORATORY TESTING:  Health maintenance labs ordered today as discussed above.   The natural history of prostate cancer and ongoing controversy regarding screening and potential treatment outcomes of prostate cancer has been discussed with the patient. The meaning of a false positive PSA and a false negative PSA has been discussed. He indicates understanding of the limitations of this screening  test and wishes to proceed with screening PSA testing.   IMMUNIZATIONS:   - Tdap: Tetanus vaccination status reviewed: UTD - Influenza: Refused - Pneumovax: Refused - Prevnar: Refused - Zostavax vaccine: Refused  SCREENING: - Colonoscopy: Up to date  Discussed with patient purpose of the colonoscopy is to detect colon cancer at curable precancerous or early stages   - AAA Screening: Not applicable  -Hearing Test: Ordered today   PATIENT COUNSELING:    Sexuality: Discussed sexually transmitted diseases, partner selection, use of condoms, avoidance of unintended pregnancy  and contraceptive alternatives.   Advised to avoid cigarette smoking.  I discussed with the patient that most people either abstain from alcohol or drink within safe limits (<=14/week and <=4 drinks/occasion for males, <=7/weeks and <= 3 drinks/occasion for females) and that the risk for alcohol disorders and other health effects rises proportionally with the number of drinks per week and how often a drinker exceeds daily limits.  Discussed cessation/primary prevention of drug use and availability of treatment for abuse.   Diet: Encouraged to adjust caloric intake to maintain  or achieve ideal body weight, to reduce intake of dietary saturated fat and total fat, to limit sodium intake by avoiding high  sodium foods and not adding table salt, and to maintain adequate dietary potassium and calcium preferably from fresh fruits, vegetables, and low-fat dairy products.    stressed the importance of regular exercise  Injury prevention: Discussed safety belts, safety helmets, smoke detector, smoking near bedding or upholstery.   Dental health: Discussed importance of regular tooth brushing, flossing, and dental visits.   Follow up plan: NEXT PREVENTATIVE PHYSICAL DUE IN 1 YEAR. Return in about 6 months (around 07/04/2018) for IFG.

## 2018-01-03 NOTE — Assessment & Plan Note (Signed)
Rechecking levels today. Await results. Call with any concerns.  

## 2018-01-03 NOTE — Assessment & Plan Note (Signed)
Under good control off medicine.  Continue diet and exercise. Continue to monitor. Call with any concerns.

## 2018-01-03 NOTE — Patient Instructions (Addendum)
Preventative Services:  AAA screening: N/A Health Risk Assessment and Personalized Prevention Plan: Done today Bone Mass Measurements: N/A CVD Screening: Done today Colon Cancer Screening: UTD Depression Screening: Done today Diabetes Screening: Done today Glaucoma Screening: See your Eye Doctor Hepatitis B vaccine: N/A Hepatitis C screening: Refused HIV Screening: Refused Flu Vaccine: Refused Lung cancer Screening: N/A Obesity Screening: Done today Pneumonia Vaccines (2): Refused STI Screening: N/A PSA screening: Done today   Health Maintenance, Male A healthy lifestyle and preventive care is important for your health and wellness. Ask your health care provider about what schedule of regular examinations is right for you. What should I know about weight and diet? Eat a Healthy Diet  Eat plenty of vegetables, fruits, whole grains, low-fat dairy products, and lean protein.  Do not eat a lot of foods high in solid fats, added sugars, or salt.  Maintain a Healthy Weight Regular exercise can help you achieve or maintain a healthy weight. You should:  Do at least 150 minutes of exercise each week. The exercise should increase your heart rate and make you sweat (moderate-intensity exercise).  Do strength-training exercises at least twice a week.  Watch Your Levels of Cholesterol and Blood Lipids  Have your blood tested for lipids and cholesterol every 5 years starting at 65 years of age. If you are at high risk for heart disease, you should start having your blood tested when you are 65 years old. You may need to have your cholesterol levels checked more often if: ? Your lipid or cholesterol levels are high. ? You are older than 65 years of age. ? You are at high risk for heart disease.  What should I know about cancer screening? Many types of cancers can be detected early and may often be prevented. Lung Cancer  You should be screened every year for lung cancer if: ? You  are a current smoker who has smoked for at least 30 years. ? You are a former smoker who has quit within the past 15 years.  Talk to your health care provider about your screening options, when you should start screening, and how often you should be screened.  Colorectal Cancer  Routine colorectal cancer screening usually begins at 65 years of age and should be repeated every 5-10 years until you are 65 years old. You may need to be screened more often if early forms of precancerous polyps or small growths are found. Your health care provider may recommend screening at an earlier age if you have risk factors for colon cancer.  Your health care provider may recommend using home test kits to check for hidden blood in the stool.  A small camera at the end of a tube can be used to examine your colon (sigmoidoscopy or colonoscopy). This checks for the earliest forms of colorectal cancer.  Prostate and Testicular Cancer  Depending on your age and overall health, your health care provider may do certain tests to screen for prostate and testicular cancer.  Talk to your health care provider about any symptoms or concerns you have about testicular or prostate cancer.  Skin Cancer  Check your skin from head to toe regularly.  Tell your health care provider about any new moles or changes in moles, especially if: ? There is a change in a mole's size, shape, or color. ? You have a mole that is larger than a pencil eraser.  Always use sunscreen. Apply sunscreen liberally and repeat throughout the day.  Protect yourself  by wearing long sleeves, pants, a wide-brimmed hat, and sunglasses when outside.  What should I know about heart disease, diabetes, and high blood pressure?  If you are 5818-65 years of age, have your blood pressure checked every 3-5 years. If you are 65 years of age or older, have your blood pressure checked every year. You should have your blood pressure measured twice-once when you  are at a hospital or clinic, and once when you are not at a hospital or clinic. Record the average of the two measurements. To check your blood pressure when you are not at a hospital or clinic, you can use: ? An automated blood pressure machine at a pharmacy. ? A home blood pressure monitor.  Talk to your health care provider about your target blood pressure.  If you are between 7245-65 years old, ask your health care provider if you should take aspirin to prevent heart disease.  Have regular diabetes screenings by checking your fasting blood sugar level. ? If you are at a normal weight and have a low risk for diabetes, have this test once every three years after the age of 65. ? If you are overweight and have a high risk for diabetes, consider being tested at a younger age or more often.  A one-time screening for abdominal aortic aneurysm (AAA) by ultrasound is recommended for men aged 65-75 years who are current or former smokers. What should I know about preventing infection? Hepatitis B If you have a higher risk for hepatitis B, you should be screened for this virus. Talk with your health care provider to find out if you are at risk for hepatitis B infection. Hepatitis C Blood testing is recommended for:  Everyone born from [id number] through 1963.  Anyone with known risk factors for hepatitis C.  Sexually Transmitted Diseases (STDs)  You should be screened each year for STDs including gonorrhea and chlamydia if: ? You are sexually active and are younger than 65 years of age. ? You are older than 65 years of age and your health care provider tells you that you are at risk for this type of infection. ? Your sexual activity has changed since you were last screened and you are at an increased risk for chlamydia or gonorrhea. Ask your health care provider if you are at risk.  Talk with your health care provider about whether you are at high risk of being infected with HIV. Your health care  provider may recommend a prescription medicine to help prevent HIV infection.  What else can I do?  Schedule regular health, dental, and eye exams.  Stay current with your vaccines (immunizations).  Do not use any tobacco products, such as cigarettes, chewing tobacco, and e-cigarettes. If you need help quitting, ask your health care provider.  Limit alcohol intake to no more than 2 drinks per day. One drink equals 12 ounces of beer, 5 ounces of wine, or 1 ounces of hard liquor.  Do not use street drugs.  Do not share needles.  Ask your health care provider for help if you need support or information about quitting drugs.  Tell your health care provider if you often feel depressed.  Tell your health care provider if you have ever been abused or do not feel safe at home. This information is not intended to replace advice given to you by your health care provider. Make sure you discuss any questions you have with your health care provider. Document Released: 10/01/2007 Document Revised: 12/02/2015  Document Reviewed: 01/06/2015 Elsevier Interactive Patient Education  [employer].

## 2018-01-04 LAB — CBC WITH DIFFERENTIAL/PLATELET
BASOS ABS: 0 10*3/uL (ref 0.0–0.2)
Basos: 1 %
EOS (ABSOLUTE): 0.3 10*3/uL (ref 0.0–0.4)
Eos: 4 %
HEMATOCRIT: 33.2 % — AB (ref 37.5–51.0)
HEMOGLOBIN: 10.7 g/dL — AB (ref 13.0–17.7)
Immature Grans (Abs): 0.1 10*3/uL (ref 0.0–0.1)
Immature Granulocytes: 1 %
LYMPHS: 14 %
Lymphocytes Absolute: 0.9 10*3/uL (ref 0.7–3.1)
MCH: 24.5 pg — ABNORMAL LOW (ref 26.6–33.0)
MCHC: 32.2 g/dL (ref 31.5–35.7)
MCV: 76 fL — ABNORMAL LOW (ref 79–97)
MONOCYTES: 12 %
Monocytes Absolute: 0.8 10*3/uL (ref 0.1–0.9)
Neutrophils Absolute: 4.3 10*3/uL (ref 1.4–7.0)
Neutrophils: 68 %
Platelets: 466 10*3/uL — ABNORMAL HIGH (ref 150–450)
RBC: 4.37 x10E6/uL (ref 4.14–5.80)
RDW: 12.8 % (ref 12.3–15.4)
WBC: 6.4 10*3/uL (ref 3.4–10.8)

## 2018-01-04 LAB — LIPID PANEL W/O CHOL/HDL RATIO
Cholesterol, Total: 140 mg/dL (ref 100–199)
HDL: 32 mg/dL — ABNORMAL LOW (ref >39)
LDL CALC: 97 mg/dL (ref 0–99)
Triglycerides: 56 mg/dL (ref 0–149)
VLDL Cholesterol Cal: 11 mg/dL (ref 5–40)

## 2018-01-04 LAB — COMPREHENSIVE METABOLIC PANEL
ALT: 10 IU/L (ref 0–44)
AST: 13 IU/L (ref 0–40)
Albumin/Globulin Ratio: 1.2 (ref 1.2–2.2)
Albumin: 3.5 g/dL — ABNORMAL LOW (ref 3.6–4.8)
Alkaline Phosphatase: 86 IU/L (ref 39–117)
BUN/Creatinine Ratio: 13 (ref 10–24)
BUN: 11 mg/dL (ref 8–27)
Bilirubin Total: 0.4 mg/dL (ref 0.0–1.2)
CO2: 21 mmol/L (ref 20–29)
CREATININE: 0.83 mg/dL (ref 0.76–1.27)
Calcium: 8.9 mg/dL (ref 8.6–10.2)
Chloride: 101 mmol/L (ref 96–106)
GFR calc non Af Amer: 92 mL/min/{1.73_m2} (ref >59)
GFR, EST AFRICAN AMERICAN: 107 mL/min/{1.73_m2} (ref >59)
GLUCOSE: 84 mg/dL (ref 65–99)
Globulin, Total: 2.9 g/dL (ref 1.5–4.5)
Potassium: 4.5 mmol/L (ref 3.5–5.2)
Sodium: 139 mmol/L (ref 134–144)
Total Protein: 6.4 g/dL (ref 6.0–8.5)

## 2018-01-04 LAB — TSH: TSH: 3.37 u[IU]/mL (ref 0.450–4.500)

## 2018-01-04 LAB — PSA: PROSTATE SPECIFIC AG, SERUM: 3 ng/mL (ref 0.0–4.0)

## 2018-01-08 ENCOUNTER — Telehealth: Payer: Self-pay | Admitting: Family Medicine

## 2018-01-08 DIAGNOSIS — D649 Anemia, unspecified: Secondary | ICD-10-CM

## 2018-01-08 MED ORDER — FERROUS SULFATE 325 (65 FE) MG PO TABS: 325.0000 mg | ORAL_TABLET | Freq: Two times a day (BID) | ORAL | 3 refills | Status: DC

## 2018-01-08 NOTE — Telephone Encounter (Signed)
Please let him know that his blood count dropped. Everything else on his labs came back normal. I'd like him to start taking an iron supplement (Rx to his pharmacy) and come back in for a recheck in 2 weeks (order in). Thanks!

## 2018-01-08 NOTE — Telephone Encounter (Signed)
Message relayed to patient. Verbalized understanding and denied questions.   

## 2018-01-16 DIAGNOSIS — M316 Other giant cell arteritis: Secondary | ICD-10-CM

## 2018-01-16 HISTORY — DX: Other giant cell arteritis: M31.6

## 2018-01-22 DIAGNOSIS — R42 Dizziness and giddiness: Secondary | ICD-10-CM | POA: Diagnosis not present

## 2018-01-22 DIAGNOSIS — J309 Allergic rhinitis, unspecified: Secondary | ICD-10-CM | POA: Diagnosis not present

## 2018-01-22 DIAGNOSIS — J329 Chronic sinusitis, unspecified: Secondary | ICD-10-CM | POA: Diagnosis not present

## 2018-01-23 ENCOUNTER — Other Ambulatory Visit: Payer: Medicare Other

## 2018-01-23 DIAGNOSIS — D649 Anemia, unspecified: Secondary | ICD-10-CM

## 2018-01-24 LAB — CBC WITH DIFFERENTIAL/PLATELET
Basophils Absolute: 0 10*3/uL (ref 0.0–0.2)
Basos: 1 %
EOS (ABSOLUTE): 0.6 10*3/uL — ABNORMAL HIGH (ref 0.0–0.4)
EOS: 9 %
HEMATOCRIT: 33.8 % — AB (ref 37.5–51.0)
HEMOGLOBIN: 10.5 g/dL — AB (ref 13.0–17.7)
IMMATURE GRANS (ABS): 0 10*3/uL (ref 0.0–0.1)
IMMATURE GRANULOCYTES: 0 %
Lymphocytes Absolute: 1.1 10*3/uL (ref 0.7–3.1)
Lymphs: 17 %
MCH: 23.8 pg — ABNORMAL LOW (ref 26.6–33.0)
MCHC: 31.1 g/dL — ABNORMAL LOW (ref 31.5–35.7)
MCV: 77 fL — ABNORMAL LOW (ref 79–97)
MONOCYTES: 11 %
Monocytes Absolute: 0.7 10*3/uL (ref 0.1–0.9)
NEUTROS PCT: 62 %
Neutrophils Absolute: 4.2 10*3/uL (ref 1.4–7.0)
Platelets: 458 10*3/uL — ABNORMAL HIGH (ref 150–450)
RBC: 4.42 x10E6/uL (ref 4.14–5.80)
RDW: 13.7 % (ref 12.3–15.4)
WBC: 6.7 10*3/uL (ref 3.4–10.8)

## 2018-01-24 LAB — IRON AND TIBC
IRON SATURATION: 16 % (ref 15–55)
IRON: 29 ug/dL — AB (ref 38–169)
Total Iron Binding Capacity: 181 ug/dL — ABNORMAL LOW (ref 250–450)
UIBC: 152 ug/dL (ref 111–343)

## 2018-01-24 LAB — FERRITIN: Ferritin: 446 ng/mL — ABNORMAL HIGH (ref 30–400)

## 2018-01-25 ENCOUNTER — Other Ambulatory Visit: Payer: Self-pay | Admitting: Family Medicine

## 2018-01-25 DIAGNOSIS — D649 Anemia, unspecified: Secondary | ICD-10-CM

## 2018-01-25 NOTE — Progress Notes (Signed)
Referral to heme

## 2018-01-31 ENCOUNTER — Inpatient Hospital Stay: Payer: Medicare Other | Admitting: Internal Medicine

## 2018-02-01 ENCOUNTER — Inpatient Hospital Stay: Payer: Medicare Other

## 2018-02-01 ENCOUNTER — Other Ambulatory Visit: Payer: Self-pay

## 2018-02-01 ENCOUNTER — Encounter: Payer: Self-pay | Admitting: Internal Medicine

## 2018-02-01 ENCOUNTER — Inpatient Hospital Stay: Payer: Medicare Other | Attending: Internal Medicine | Admitting: Internal Medicine

## 2018-02-01 VITALS — BP 125/76 | HR 87 | Temp 97.3°F | Resp 16 | Wt 206.0 lb

## 2018-02-01 DIAGNOSIS — D508 Other iron deficiency anemias: Secondary | ICD-10-CM | POA: Diagnosis not present

## 2018-02-01 DIAGNOSIS — R42 Dizziness and giddiness: Secondary | ICD-10-CM | POA: Diagnosis not present

## 2018-02-01 DIAGNOSIS — D509 Iron deficiency anemia, unspecified: Secondary | ICD-10-CM | POA: Insufficient documentation

## 2018-02-01 DIAGNOSIS — R61 Generalized hyperhidrosis: Secondary | ICD-10-CM | POA: Insufficient documentation

## 2018-02-01 DIAGNOSIS — I10 Essential (primary) hypertension: Secondary | ICD-10-CM | POA: Diagnosis not present

## 2018-02-01 DIAGNOSIS — Z800 Family history of malignant neoplasm of digestive organs: Secondary | ICD-10-CM | POA: Diagnosis not present

## 2018-02-01 DIAGNOSIS — Z880 Allergy status to penicillin: Secondary | ICD-10-CM | POA: Diagnosis not present

## 2018-02-01 DIAGNOSIS — R51 Headache: Secondary | ICD-10-CM | POA: Diagnosis not present

## 2018-02-01 DIAGNOSIS — R634 Abnormal weight loss: Secondary | ICD-10-CM

## 2018-02-01 DIAGNOSIS — E785 Hyperlipidemia, unspecified: Secondary | ICD-10-CM | POA: Insufficient documentation

## 2018-02-01 DIAGNOSIS — R5381 Other malaise: Secondary | ICD-10-CM | POA: Diagnosis not present

## 2018-02-01 DIAGNOSIS — Z7952 Long term (current) use of systemic steroids: Secondary | ICD-10-CM | POA: Insufficient documentation

## 2018-02-01 DIAGNOSIS — R161 Splenomegaly, not elsewhere classified: Secondary | ICD-10-CM

## 2018-02-01 DIAGNOSIS — R5383 Other fatigue: Secondary | ICD-10-CM | POA: Diagnosis not present

## 2018-02-01 DIAGNOSIS — Z79899 Other long term (current) drug therapy: Secondary | ICD-10-CM | POA: Insufficient documentation

## 2018-02-01 LAB — CBC WITH DIFFERENTIAL/PLATELET
Abs Immature Granulocytes: 0.03 10*3/uL (ref 0.00–0.07)
BASOS PCT: 1 %
Basophils Absolute: 0 10*3/uL (ref 0.0–0.1)
EOS ABS: 0.9 10*3/uL — AB (ref 0.0–0.5)
Eosinophils Relative: 11 %
HEMATOCRIT: 36.1 % — AB (ref 39.0–52.0)
Hemoglobin: 11.1 g/dL — ABNORMAL LOW (ref 13.0–17.0)
Immature Granulocytes: 0 %
LYMPHS ABS: 1.2 10*3/uL (ref 0.7–4.0)
Lymphocytes Relative: 15 %
MCH: 23.6 pg — ABNORMAL LOW (ref 26.0–34.0)
MCHC: 30.7 g/dL (ref 30.0–36.0)
MCV: 76.8 fL — AB (ref 80.0–100.0)
MONOS PCT: 8 %
Monocytes Absolute: 0.7 10*3/uL (ref 0.1–1.0)
Neutro Abs: 5.1 10*3/uL (ref 1.7–7.7)
Neutrophils Relative %: 65 %
PLATELETS: 385 10*3/uL (ref 150–400)
RBC: 4.7 MIL/uL (ref 4.22–5.81)
RDW: 15 % (ref 11.5–15.5)
WBC: 7.8 10*3/uL (ref 4.0–10.5)
nRBC: 0 % (ref 0.0–0.2)

## 2018-02-01 LAB — TECHNOLOGIST SMEAR REVIEW: Tech Review: NORMAL

## 2018-02-01 LAB — RETICULOCYTES
IMMATURE RETIC FRACT: 14.7 % (ref 2.3–15.9)
RBC.: 4.7 MIL/uL (ref 4.22–5.81)
RETIC COUNT ABSOLUTE: 78.5 10*3/uL (ref 19.0–186.0)
RETIC CT PCT: 1.7 % (ref 0.4–3.1)

## 2018-02-01 LAB — LACTATE DEHYDROGENASE: LDH: 98 U/L (ref 98–192)

## 2018-02-01 LAB — C-REACTIVE PROTEIN: CRP: 9.9 mg/dL — AB (ref <1.0)

## 2018-02-01 NOTE — Assessment & Plan Note (Addendum)
#  MICROCYTIC ANEMIA-unclear etiology.  Not iron deficiency-ferritin 446; iron saturation 16%.  Question primary bone marrow process like myelofibrosis given the weight loss/night sweats etc. other differential diagnosis includes-anemia of chronic disease/inflammation.  #Also discussed regarding a bone marrow biopsy-based upon the results of the work-up.  Recommend stopping iron.  # headaches-? Giant cell arteritis-versus others. Await work-up below.  # will call [phone] to discuss next plan of care.   Recommend checking CBC LDH CRP monoclonal protein kappa lambda light chain; a peripheral smear; check ultrasound of the spleen.  Jak 2 mutation; monoclonal work-up.  Haptoglobin reticulocyte count.   Thank you Dr.Johnson for allowing me to participate in the care of your pleasant patient. Please do not hesitate to contact me with questions or concerns in the interim.  DISPOSITION: Labs today; Ultrasound asap;  Follow up to be decided- Dr.B  Addendum: LDH normal; CRP elevated 9.9.  Hemoglobin 11.7.

## 2018-02-01 NOTE — Progress Notes (Signed)
[facility] CONSULT NOTE  Patient Care Team: Valerie Roys, DO as PCP - General (Family Medicine)  CHIEF COMPLAINTS/PURPOSE OF CONSULTATION:  Anemia  #September 2019-microcytic anemia hemoglobin 10.5 MCV 75 [2017 normal]-iron studies saturation 16% ferritin 446 [PCP]  # Hx hemorrhoids/  colonoscopy sep 2015; [facility] [Dr.Levinson]  No history exists.     HISTORY OF PRESENTING ILLNESS:  Shane Duncan 67 y.o.  male with no significant past medical history noted to have worsening anemia in the month of September 2019.  He has been referred to [place] for further evaluation recommendations.  Patient denies any blood in stools black or stools.  Denies any blood in urine.  However he complains of headaches going on for the last 5 months or so left parietal area no vision problems.  No jaw pain.  Patient has been evaluated by CT of the maxillofacial/CT angiogram brain no acute process noted.  Also been eval by ENT.  Patient has been taking up to 4-5 ibuprofen each day.   Review of Systems  Constitutional: Positive for malaise/fatigue and weight loss (14 pounds in 3 months). Negative for chills, diaphoresis and fever.  HENT: Negative for nosebleeds and sore throat.   Eyes: Negative for double vision.  Respiratory: Negative for cough, hemoptysis, sputum production, shortness of breath and wheezing.   Cardiovascular: Negative for chest pain, palpitations, orthopnea and leg swelling.  Gastrointestinal: Negative for abdominal pain, blood in stool, constipation, diarrhea, heartburn, melena, nausea and vomiting.  Genitourinary: Negative for dysuria, frequency and urgency.  Musculoskeletal: Negative for back pain and joint pain.  Skin: Negative.  Negative for itching and rash.  Neurological: Positive for dizziness and headaches. Negative for tingling, focal weakness and weakness.  Endo/Heme/Allergies: Does not bruise/bleed easily.  Psychiatric/Behavioral: Negative for depression. The  patient is not nervous/anxious and does not have insomnia.      MEDICAL HISTORY:  Past Medical History:  Diagnosis Date  . Chronic sinusitis   . Esophagitis   . Hemorrhoids   . Hepatitis A   . Hyperlipidemia   . Hypertension   . IFG (impaired fasting glucose)     SURGICAL HISTORY: Past Surgical History:  Procedure Laterality Date  . ACHILLES TENDON SURGERY    . AV FISTULA REPAIR    . 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 Smokes mariguana. Lives in [place]. 2 children.  Social History   Socioeconomic History  . Marital status: Married    Spouse name: Not on file  . Number of children: Not on file  . Years of education: Not on file  . Highest education level: Not on file  Occupational History  . Not on file  Social Needs  . Financial resource strain: Not on file  . Food insecurity:    Worry: Not on file    Inability: Not on file  . Transportation needs:    Medical: Not on file    Non-medical: Not on file  Tobacco Use  . Smoking status: Never Smoker  . Smokeless tobacco: Never Used  Substance and Sexual Activity  . Alcohol use: Yes    Alcohol/week: 2.0 - 3.0 standard drinks    Types: 2 - 3 Cans of beer per week  . Drug use: Yes    Types: Marijuana  . Sexual activity: Yes  Lifestyle  . Physical activity:    Days per week: Not on file    Minutes per session: Not  on file  . Stress: Not on file  Relationships  . Social connections:    Talks on phone: Not on file    Gets together: Not on file    Attends religious service: Not on file    Active member of club or organization: Not on file    Attends meetings of clubs or organizations: Not on file    Relationship status: Not on file  . Intimate partner violence:    Fear of current or ex partner: Not on file    Emotionally abused: Not on file    Physically abused: Not on file    Forced sexual activity: Not on file  Other  Topics Concern  . Not on file  Social History Narrative  . Not on file    FAMILY HISTORY: Family History  Problem Relation Age of Onset  . Cancer Father        Colon  . Hypertension Father   . Hyperlipidemia Father   . Ulcerative colitis Son     ALLERGIES:  is allergic to penicillins.  MEDICATIONS:  Current Outpatient Medications  Medication Sig Dispense Refill  . ferrous sulfate 325 (65 FE) MG tablet Take 1 tablet (325 mg total) by mouth 2 (two) times daily with a meal. 60 tablet 3  . ibuprofen (ADVIL,MOTRIN) 400 MG tablet Take 400 mg by mouth every 6 (six) hours as needed.     No current facility-administered medications for this visit.       Marland Kitchen  PHYSICAL EXAMINATION: ECOG PERFORMANCE STATUS: 1 - Symptomatic but completely ambulatory  Vitals:   02/01/18 1505 02/01/18 1506  BP:  125/76  Pulse:  87  Resp: 16 16  Temp:  (!) 97.3 F (36.3 C)   Filed Weights   02/01/18 1505  Weight: 206 lb (93.4 kg)    Physical Exam  Constitutional: He is oriented to person, place, and time and well-developed, well-nourished, and in no distress.  HENT:  Head: Normocephalic and atraumatic.  Mouth/Throat: Oropharynx is clear and moist. No oropharyngeal exudate.  Eyes: Pupils are equal, round, and reactive to light.  Neck: Normal range of motion. Neck supple.  Cardiovascular: Normal rate and regular rhythm.  Pulmonary/Chest: No respiratory distress. He has no wheezes.  Abdominal: Soft. Bowel sounds are normal. He exhibits no distension and no mass. There is no tenderness. There is no rebound and no guarding.  ? Splenomegaly.   Musculoskeletal: Normal range of motion. He exhibits no edema or tenderness.  Neurological: He is alert and oriented to person, place, and time.  Skin: Skin is warm.  Psychiatric: Affect normal.     LABORATORY DATA:  I have reviewed the data as listed Lab Results  Component Value Date   WBC 7.8 02/01/2018   HGB 11.1 (L) 02/01/2018   HCT 36.1 (L)  02/01/2018   MCV 76.8 (L) 02/01/2018   PLT 385 02/01/2018   Recent Labs    12/26/17 1344 01/03/18 1014  NA 134* 139  K 4.0 4.5  CL 100 101  CO2 24 21  GLUCOSE 109* 84  BUN 12 11  CREATININE 1.05 0.83  CALCIUM 8.8* 8.9  GFRNONAA >60 92  GFRAA >60 107  PROT  --  6.4  ALBUMIN  --  3.5*  AST  --  13  ALT  --  10  ALKPHOS  --  86  BILITOT  --  0.4    RADIOGRAPHIC STUDIES: I have personally reviewed the radiological images as listed and agreed with the  findings in the report. No results found.  ASSESSMENT & PLAN:   Microcytic anemia # MICROCYTIC ANEMIA-unclear etiology.  Not iron deficiency-ferritin 446; iron saturation 16%.  Question primary bone marrow process like myelofibrosis given the weight loss/night sweats etc. other differential diagnosis includes-anemia of chronic disease/inflammation.  #Also discussed regarding a bone marrow biopsy-based upon the results of the work-up.  Recommend stopping iron.  # headaches-? Giant cell arteritis-versus others. Await work-up below.  # will call [phone] to discuss next plan of care.   Recommend checking CBC LDH CRP monoclonal protein kappa lambda light chain; a peripheral smear; check ultrasound of the spleen.  Jak 2 mutation; monoclonal work-up.  Haptoglobin reticulocyte count.   Thank you Dr.Johnson for allowing me to participate in the care of your pleasant patient. Please do not hesitate to contact me with questions or concerns in the interim.  DISPOSITION: Labs today; Ultrasound asap;  Follow up to be decided- Dr.B  Addendum: LDH normal; CRP elevated 9.9.  Hemoglobin 11.7.     All questions were answered. The patient knows to call the clinic with any problems, questions or concerns.       Cammie Sickle, MD 02/01/2018 10:22 PM

## 2018-02-02 ENCOUNTER — Other Ambulatory Visit: Payer: Self-pay

## 2018-02-02 ENCOUNTER — Encounter
Admission: RE | Admit: 2018-02-02 | Discharge: 2018-02-02 | Disposition: A | Discharge status: Home / Self Care | Payer: Medicare Other | Source: Ambulatory Visit | Attending: Vascular Surgery | Admitting: Vascular Surgery

## 2018-02-02 ENCOUNTER — Other Ambulatory Visit: Payer: Self-pay | Admitting: Internal Medicine

## 2018-02-02 ENCOUNTER — Ambulatory Visit
Admission: RE | Admit: 2018-02-02 | Discharge: 2018-02-02 | Disposition: A | Discharge status: Home / Self Care | Payer: Medicare Other | Source: Ambulatory Visit | Attending: Internal Medicine | Admitting: Internal Medicine

## 2018-02-02 ENCOUNTER — Telehealth: Payer: Self-pay | Admitting: Internal Medicine

## 2018-02-02 ENCOUNTER — Other Ambulatory Visit (INDEPENDENT_AMBULATORY_CARE_PROVIDER_SITE_OTHER): Payer: Self-pay | Admitting: Nurse Practitioner

## 2018-02-02 DIAGNOSIS — D509 Iron deficiency anemia, unspecified: Secondary | ICD-10-CM | POA: Diagnosis not present

## 2018-02-02 DIAGNOSIS — R161 Splenomegaly, not elsewhere classified: Secondary | ICD-10-CM | POA: Insufficient documentation

## 2018-02-02 DIAGNOSIS — M316 Other giant cell arteritis: Secondary | ICD-10-CM

## 2018-02-02 HISTORY — DX: Other giant cell arteritis: M31.6

## 2018-02-02 HISTORY — DX: Bronchitis, not specified as acute or chronic: J40

## 2018-02-02 LAB — BASIC METABOLIC PANEL
ANION GAP: 8 (ref 5–15)
BUN: 12 mg/dL (ref 8–23)
CALCIUM: 8.8 mg/dL — AB (ref 8.9–10.3)
CHLORIDE: 106 mmol/L (ref 98–111)
CO2: 24 mmol/L (ref 22–32)
Creatinine, Ser: 0.76 mg/dL (ref 0.61–1.24)
GFR calc non Af Amer: >60 mL/min (ref >60)
Glucose, Bld: 176 mg/dL — ABNORMAL HIGH (ref 70–99)
Potassium: 4 mmol/L (ref 3.5–5.1)
Sodium: 138 mmol/L (ref 135–145)

## 2018-02-02 LAB — PROTIME-INR
INR: 1.2
Prothrombin Time: 15.1 seconds (ref 11.4–15.2)

## 2018-02-02 LAB — TYPE AND SCREEN
ABO/RH(D): O POS
Antibody Screen: NEGATIVE

## 2018-02-02 LAB — KAPPA/LAMBDA LIGHT CHAINS
KAPPA FREE LGHT CHN: 27.3 mg/L — AB (ref 3.3–19.4)
Kappa, lambda light chain ratio: 1.21 (ref 0.26–1.65)
Lambda free light chains: 22.5 mg/L (ref 5.7–26.3)

## 2018-02-02 LAB — APTT: aPTT: 44 seconds — ABNORMAL HIGH (ref 24–36)

## 2018-02-02 LAB — HAPTOGLOBIN: HAPTOGLOBIN: 509 mg/dL — AB (ref 34–200)

## 2018-02-02 MED ORDER — PREDNISONE 20 MG PO TABS: 80.0000 mg | ORAL_TABLET | Freq: Every day | ORAL | 0 refills | Status: DC

## 2018-02-02 NOTE — Telephone Encounter (Signed)
Shane Duncan/Shane Duncan-   Discussed with patient my concerns for temporal arteritis; start prednisone 80 mg a day prescription sent.   Discussed with Dr. Cristopher Peru neurology.;  Please make a referral-ASAP/temporal arteritis.   Discussed with Dr. Wyn Quaker; vascular surgery.  Please make a referral for temporal artery biopsy asap.   Thanks GB

## 2018-02-02 NOTE — Telephone Encounter (Signed)
Referrals to Dr. Sherryll Burger & Dr. Wyn Quaker have been placed.

## 2018-02-02 NOTE — Telephone Encounter (Signed)
Dr.Johnson- FYI   I Spoke to pt that US spleen-wnl; no bone marrow needed.  Left temporal Artery  bx scheduled on 10/21 [potential Dx; Giant cell arteritis] pt on prednisone 80 mg/day; feels "better already".  Collete- Please schedule the pt to follow up with me 1 week/no labs.

## 2018-02-02 NOTE — Patient Instructions (Signed)
Your procedure is scheduled on: Monday, February 05, 2018 Report to Day Surgery on the 2nd floor of the [facility] To find out your arrival time, please call [phone] between 1PM - 3PM on: Friday, October 18.  REMEMBER: Instructions that are not followed completely may result in serious medical risk, up to and including death; or upon the discretion of your surgeon and anesthesiologist your surgery may need to be rescheduled.  Do not eat food after midnight the night before surgery.  No gum chewing, lozengers or hard candies.  You may however, drink CLEAR liquids up to 2 hours before you are scheduled to arrive for your surgery. Do not drink anything within 2 hours of the start of your surgery.  Clear liquids include: - water  - apple juice without pulp - gatorade - black coffee or tea (Do NOT add milk or creamers to the coffee or tea) Do NOT drink anything that is not on this list.  No Alcohol for 24 hours before or after surgery.  No Smoking including e-cigarettes for 24 hours prior to surgery.  No chewable tobacco products for at least 6 hours prior to surgery.  No nicotine patches on the day of surgery.  On the morning of surgery brush your teeth with toothpaste and water, you may rinse your mouth with mouthwash if you wish. Do not swallow any toothpaste or mouthwash.  Notify your doctor if there is any change in your medical condition (cold, fever, infection).  Do not wear jewelry, make-up, hairpins, clips or nail polish.  Do not wear lotions, powders, or perfumes.   Do not shave 48 hours prior to surgery. Men may shave face and neck.  Contacts and dentures may not be worn into surgery.  Do not bring valuables to the hospital, including drivers license, insurance or credit cards.  [facility] is not responsible for any belongings or valuables.   TAKE THESE MEDICATIONS THE MORNING OF SURGERY:  1.  PREDNISONE  NOW!  Stop Anti-inflammatories (NSAIDS) such as  Advil, Aleve, Ibuprofen, Motrin, Naproxen, Naprosyn and Aspirin based products such as Excedrin, Goodys Powder, BC Powder. (May take Tylenol or Acetaminophen if needed.)  NOW!  Stop ANY OVER THE COUNTER supplements until after surgery.  Wear comfortable clothing (specific to your surgery type) to the hospital.  If you are being discharged the day of surgery, you will not be allowed to drive home. You will need a responsible adult to drive you home and stay with you that night.   If you are taking public transportation, you will need to have a responsible adult with you. Please confirm with your physician that it is acceptable to use public transportation.   Please call [phone] if you have any questions about these instructions.

## 2018-02-03 LAB — MULTIPLE MYELOMA PANEL, SERUM
ALPHA2 GLOB SERPL ELPH-MCNC: 1.1 g/dL — AB (ref 0.4–1.0)
Albumin SerPl Elph-Mcnc: 3.2 g/dL (ref 2.9–4.4)
Albumin/Glob SerPl: 1 (ref 0.7–1.7)
Alpha 1: 0.4 g/dL (ref 0.0–0.4)
B-Globulin SerPl Elph-Mcnc: 0.9 g/dL (ref 0.7–1.3)
Gamma Glob SerPl Elph-Mcnc: 1.1 g/dL (ref 0.4–1.8)
Globulin, Total: 3.5 g/dL (ref 2.2–3.9)
IGG (IMMUNOGLOBIN G), SERUM: 1257 mg/dL (ref 700–1600)
IGM (IMMUNOGLOBULIN M), SRM: 176 mg/dL — AB (ref 20–172)
IgA: 135 mg/dL (ref 61–437)
Total Protein ELP: 6.7 g/dL (ref 6.0–8.5)

## 2018-02-04 MED ORDER — CLINDAMYCIN PHOSPHATE 900 MG/50ML IV SOLN
900.0000 mg | INTRAVENOUS | Status: AC
  Administered 2018-02-05: 900 mg via INTRAVENOUS

## 2018-02-05 ENCOUNTER — Encounter: Payer: Self-pay | Admitting: Certified Registered Nurse Anesthetist

## 2018-02-05 ENCOUNTER — Ambulatory Visit: Payer: Medicare Other | Admitting: Certified Registered Nurse Anesthetist

## 2018-02-05 ENCOUNTER — Encounter
Admission: RE | Disposition: A | Discharge status: Home / Self Care | Payer: Self-pay | Source: Ambulatory Visit | Attending: Vascular Surgery

## 2018-02-05 ENCOUNTER — Ambulatory Visit
Admission: RE | Admit: 2018-02-05 | Discharge: 2018-02-05 | Disposition: A | Discharge status: Home / Self Care | Payer: Medicare Other | Source: Ambulatory Visit | Attending: Vascular Surgery | Admitting: Vascular Surgery

## 2018-02-05 DIAGNOSIS — R7982 Elevated C-reactive protein (CRP): Secondary | ICD-10-CM | POA: Diagnosis not present

## 2018-02-05 DIAGNOSIS — I10 Essential (primary) hypertension: Secondary | ICD-10-CM | POA: Diagnosis not present

## 2018-02-05 DIAGNOSIS — D649 Anemia, unspecified: Secondary | ICD-10-CM | POA: Diagnosis not present

## 2018-02-05 DIAGNOSIS — E785 Hyperlipidemia, unspecified: Secondary | ICD-10-CM | POA: Insufficient documentation

## 2018-02-05 DIAGNOSIS — Z79899 Other long term (current) drug therapy: Secondary | ICD-10-CM | POA: Insufficient documentation

## 2018-02-05 DIAGNOSIS — R51 Headache: Secondary | ICD-10-CM | POA: Diagnosis not present

## 2018-02-05 DIAGNOSIS — H539 Unspecified visual disturbance: Secondary | ICD-10-CM | POA: Diagnosis not present

## 2018-02-05 DIAGNOSIS — M316 Other giant cell arteritis: Secondary | ICD-10-CM | POA: Diagnosis not present

## 2018-02-05 HISTORY — PX: ARTERY BIOPSY: SHX891

## 2018-02-05 LAB — URINE DRUG SCREEN, QUALITATIVE (ARMC ONLY)
AMPHETAMINES, UR SCREEN: NOT DETECTED
BENZODIAZEPINE, UR SCRN: NOT DETECTED
Barbiturates, Ur Screen: NOT DETECTED
Cannabinoid 50 Ng, Ur ~~LOC~~: POSITIVE — AB
Cocaine Metabolite,Ur ~~LOC~~: NOT DETECTED
MDMA (ECSTASY) UR SCREEN: NOT DETECTED
METHADONE SCREEN, URINE: NOT DETECTED
Opiate, Ur Screen: NOT DETECTED
PHENCYCLIDINE (PCP) UR S: NOT DETECTED
Tricyclic, Ur Screen: NOT DETECTED

## 2018-02-05 SURGERY — BIOPSY TEMPORAL ARTERY
Anesthesia: General | Site: Face | Laterality: Left

## 2018-02-05 MED ORDER — LACTATED RINGERS IV SOLN
INTRAVENOUS | Status: DC
  Administered 2018-02-05: 14:00:00 via INTRAVENOUS

## 2018-02-05 MED ORDER — LIDOCAINE HCL (PF) 2 % IJ SOLN
INTRAMUSCULAR | Status: AC
  Filled 2018-02-05: qty 10

## 2018-02-05 MED ORDER — DEXAMETHASONE SODIUM PHOSPHATE 10 MG/ML IJ SOLN
INTRAMUSCULAR | Status: AC
  Filled 2018-02-05: qty 1

## 2018-02-05 MED ORDER — MIDAZOLAM HCL 2 MG/2ML IJ SOLN
INTRAMUSCULAR | Status: DC | PRN
  Administered 2018-02-05: 2 mg via INTRAVENOUS

## 2018-02-05 MED ORDER — LIDOCAINE-EPINEPHRINE 1 %-1:100000 IJ SOLN
INTRAMUSCULAR | Status: AC
  Filled 2018-02-05: qty 1

## 2018-02-05 MED ORDER — MEPERIDINE HCL 50 MG/ML IJ SOLN: 6.2500 mg | INTRAMUSCULAR | Status: DC | PRN

## 2018-02-05 MED ORDER — PROPOFOL 10 MG/ML IV BOLUS
INTRAVENOUS | Status: AC
  Filled 2018-02-05: qty 20

## 2018-02-05 MED ORDER — ACETAMINOPHEN 325 MG PO TABS: 325.0000 mg | ORAL_TABLET | ORAL | Status: DC | PRN

## 2018-02-05 MED ORDER — ACETAMINOPHEN 160 MG/5ML PO SOLN: 325.0000 mg | ORAL | Status: DC | PRN

## 2018-02-05 MED ORDER — FAMOTIDINE 20 MG PO TABS
20.0000 mg | ORAL_TABLET | Freq: Once | ORAL | Status: AC
  Administered 2018-02-05: 20 mg via ORAL

## 2018-02-05 MED ORDER — CLINDAMYCIN PHOSPHATE 900 MG/50ML IV SOLN
INTRAVENOUS | Status: AC
  Filled 2018-02-05: qty 50

## 2018-02-05 MED ORDER — MIDAZOLAM HCL 2 MG/2ML IJ SOLN
INTRAMUSCULAR | Status: AC
  Filled 2018-02-05: qty 2

## 2018-02-05 MED ORDER — FENTANYL CITRATE (PF) 100 MCG/2ML IJ SOLN: 25.0000 ug | INTRAMUSCULAR | Status: DC | PRN

## 2018-02-05 MED ORDER — LIDOCAINE-EPINEPHRINE 1 %-1:100000 IJ SOLN
INTRAMUSCULAR | Status: DC | PRN
  Administered 2018-02-05: 13 mL

## 2018-02-05 MED ORDER — HYDROMORPHONE HCL 1 MG/ML IJ SOLN: 1.0000 mg | Freq: Once | INTRAMUSCULAR | Status: DC | PRN

## 2018-02-05 MED ORDER — HYDROCODONE-ACETAMINOPHEN 5-325 MG PO TABS: 1.0000 | ORAL_TABLET | Freq: Four times a day (QID) | ORAL | 0 refills | Status: DC | PRN

## 2018-02-05 MED ORDER — ONDANSETRON HCL 4 MG/2ML IJ SOLN: 4.0000 mg | Freq: Four times a day (QID) | INTRAMUSCULAR | Status: DC | PRN

## 2018-02-05 MED ORDER — LIDOCAINE HCL (CARDIAC) PF 100 MG/5ML IV SOSY
PREFILLED_SYRINGE | INTRAVENOUS | Status: DC | PRN
  Administered 2018-02-05: 50 mg via INTRAVENOUS

## 2018-02-05 MED ORDER — FAMOTIDINE 20 MG PO TABS
ORAL_TABLET | ORAL | Status: AC
  Administered 2018-02-05: 20 mg via ORAL
  Filled 2018-02-05: qty 1

## 2018-02-05 MED ORDER — HYDROCODONE-ACETAMINOPHEN 7.5-325 MG PO TABS: 1.0000 | ORAL_TABLET | Freq: Once | ORAL | Status: DC | PRN

## 2018-02-05 MED ORDER — SEVOFLURANE IN SOLN
RESPIRATORY_TRACT | Status: AC
  Filled 2018-02-05: qty 250

## 2018-02-05 MED ORDER — PROMETHAZINE HCL 25 MG/ML IJ SOLN: 6.2500 mg | INTRAMUSCULAR | Status: DC | PRN

## 2018-02-05 MED ORDER — FENTANYL CITRATE (PF) 100 MCG/2ML IJ SOLN
INTRAMUSCULAR | Status: DC | PRN
  Administered 2018-02-05 (×4): 25 ug via INTRAVENOUS

## 2018-02-05 MED ORDER — PROPOFOL 500 MG/50ML IV EMUL
INTRAVENOUS | Status: DC | PRN
  Administered 2018-02-05: 120 ug/kg/min via INTRAVENOUS

## 2018-02-05 MED ORDER — FENTANYL CITRATE (PF) 100 MCG/2ML IJ SOLN
INTRAMUSCULAR | Status: AC
  Filled 2018-02-05: qty 2

## 2018-02-05 MED ORDER — ONDANSETRON HCL 4 MG/2ML IJ SOLN
INTRAMUSCULAR | Status: AC
  Filled 2018-02-05: qty 2

## 2018-02-05 SURGICAL SUPPLY — 42 items
BLADE CLIPPER SURG (BLADE) ×3 IMPLANT
BLADE SURG 15 STRL LF DISP TIS (BLADE) ×1 IMPLANT
BLADE SURG 15 STRL SS (BLADE) ×2
BLADE SURG SZ11 CARB STEEL (BLADE) ×3 IMPLANT
CNTNR SPEC 2.5X3XGRAD LEK (MISCELLANEOUS)
CONT SPEC 4OZ STER OR WHT (MISCELLANEOUS)
CONTAINER SPEC 2.5X3XGRAD LEK (MISCELLANEOUS) IMPLANT
COTTON BALL STRL MEDIUM (GAUZE/BANDAGES/DRESSINGS) ×3 IMPLANT
COVER WAND RF STERILE (DRAPES) ×3 IMPLANT
DERMABOND ADVANCED (GAUZE/BANDAGES/DRESSINGS) ×2
DERMABOND ADVANCED .7 DNX12 (GAUZE/BANDAGES/DRESSINGS) ×1 IMPLANT
DRAPE LAPAROTOMY 77X122 PED (DRAPES) ×3 IMPLANT
DRSG TELFA 4X3 1S NADH ST (GAUZE/BANDAGES/DRESSINGS) ×3 IMPLANT
ELECT CAUTERY BLADE 6.4 (BLADE) ×3 IMPLANT
ELECT REM PT RETURN 9FT ADLT (ELECTROSURGICAL) ×3
ELECTRODE REM PT RTRN 9FT ADLT (ELECTROSURGICAL) ×1 IMPLANT
GLOVE BIO SURGEON STRL SZ7 (GLOVE) ×3 IMPLANT
GOWN STRL REUS W/ TWL LRG LVL3 (GOWN DISPOSABLE) ×1 IMPLANT
GOWN STRL REUS W/ TWL XL LVL3 (GOWN DISPOSABLE) ×2 IMPLANT
GOWN STRL REUS W/TWL LRG LVL3 (GOWN DISPOSABLE) ×2
GOWN STRL REUS W/TWL XL LVL3 (GOWN DISPOSABLE) ×4
KIT TURNOVER KIT A (KITS) ×3 IMPLANT
LABEL OR SOLS (LABEL) ×3 IMPLANT
NEEDLE HYPO 25X1 1.5 SAFETY (NEEDLE) IMPLANT
NS IRRIG 500ML POUR BTL (IV SOLUTION) ×3 IMPLANT
PACK BASIN MINOR ARMC (MISCELLANEOUS) ×3 IMPLANT
SOL PREP PVP 2OZ (MISCELLANEOUS) ×3
SOLUTION PREP PVP 2OZ (MISCELLANEOUS) ×1 IMPLANT
SUCTION FRAZIER HANDLE 10FR (MISCELLANEOUS) ×2
SUCTION TUBE FRAZIER 10FR DISP (MISCELLANEOUS) ×1 IMPLANT
SUT MNCRL AB 4-0 PS2 18 (SUTURE) ×3 IMPLANT
SUT SILK 2 0 (SUTURE) ×2
SUT SILK 2-0 18XBRD TIE 12 (SUTURE) ×1 IMPLANT
SUT SILK 3 0 (SUTURE) ×2
SUT SILK 3-0 18XBRD TIE 12 (SUTURE) ×1 IMPLANT
SUT SILK 4 0 (SUTURE) ×2
SUT SILK 4-0 18XBRD TIE 12 (SUTURE) ×1 IMPLANT
SUT VIC AB 3-0 SH 27 (SUTURE) ×2
SUT VIC AB 3-0 SH 27X BRD (SUTURE) ×1 IMPLANT
SYR 10ML LL (SYRINGE) ×3 IMPLANT
SYR BULB IRRIG 60ML STRL (SYRINGE) ×3 IMPLANT
TAPE TRANSPORE STRL 2 31045 (GAUZE/BANDAGES/DRESSINGS) ×3 IMPLANT

## 2018-02-05 NOTE — Discharge Instructions (Signed)

## 2018-02-05 NOTE — Anesthesia Preprocedure Evaluation (Addendum)
Anesthesia Evaluation  Patient identified by MRN, date of birth, ID band Patient awake    Reviewed: Allergy & Precautions, H&P , NPO status , reviewed documented beta blocker date and time   Airway Mallampati: II  TM Distance: >3 FB Neck ROM: full    Dental  (+) Chipped, Missing   Pulmonary    Pulmonary exam normal        Cardiovascular hypertension, Normal cardiovascular exam     Neuro/Psych    GI/Hepatic neg GERD  ,(+) Hepatitis -, A  Endo/Other    Renal/GU      Musculoskeletal   Abdominal   Peds  Hematology  (+) Blood dyscrasia, anemia ,   Anesthesia Other Findings Past Medical History: No date: Bronchitis No date: Chronic sinusitis No date: Esophagitis No date: Hemorrhoids No date: Hepatitis A No date: Hyperlipidemia No date: Hypertension No date: IFG (impaired fasting glucose) 01/2018: Temporal arteritis (HCC)  Past Surgical History: 1997: ACHILLES TENDON SURGERY; Right 1990's: ANUS SURGERY     Comment:  anal fistula repair 1961: TONSILLECTOMY     Reproductive/Obstetrics                            Anesthesia Physical Anesthesia Plan  ASA: III  Anesthesia Plan: General   Post-op Pain Management:    Induction: Intravenous  PONV Risk Score and Plan: Treatment may vary due to age or medical condition and TIVA  Airway Management Planned: Nasal Cannula and Natural Airway  Additional Equipment:   Intra-op Plan:   Post-operative Plan:   Informed Consent: I have reviewed the patients History and Physical, chart, labs and discussed the procedure including the risks, benefits and alternatives for the proposed anesthesia with the patient or authorized representative who has indicated his/her understanding and acceptance.   Dental Advisory Given  Plan Discussed with: CRNA  Anesthesia Plan Comments:         Anesthesia Quick Evaluation

## 2018-02-05 NOTE — Anesthesia Post-op Follow-up Note (Signed)
Anesthesia QCDR form completed.        

## 2018-02-05 NOTE — Anesthesia Postprocedure Evaluation (Signed)
Anesthesia Post Note  Patient: Shane Duncan  Procedure(s) Performed: BIOPSY TEMPORAL ARTERY (Left Face)  Patient location during evaluation: PACU Anesthesia Type: General and MAC Level of consciousness: awake and alert Pain management: pain level controlled Vital Signs Assessment: post-procedure vital signs reviewed and stable Respiratory status: spontaneous breathing, nonlabored ventilation, respiratory function stable and patient connected to nasal cannula oxygen Cardiovascular status: blood pressure returned to baseline and stable Postop Assessment: no apparent nausea or vomiting Anesthetic complications: no     Last Vitals:  Vitals:   02/05/18 1715 02/05/18 1731  BP: 140/85 135/73  Pulse: 87 62  Resp: 15 16  Temp:  (!) 36.3 C  SpO2: 96% 100%    Last Pain:  Vitals:   02/05/18 1731  TempSrc: Temporal  PainSc: 0-No pain                 Kobie Whidby S

## 2018-02-05 NOTE — Transfer of Care (Signed)
Immediate Anesthesia Transfer of Care Note  Patient: Shane Duncan  Procedure(s) Performed: BIOPSY TEMPORAL ARTERY (Left Face)  Patient Location: PACU  Anesthesia Type:General  Level of Consciousness: awake, alert  and oriented  Airway & Oxygen Therapy: Patient Spontanous Breathing  Post-op Assessment: Report given to RN and Post -op Vital signs reviewed and stable  Post vital signs: Reviewed and stable  Last Vitals:  Vitals Value Taken Time  BP 111/67 02/05/2018  5:00 PM  Temp    Pulse 93 02/05/2018  5:00 PM  Resp 13 02/05/2018  5:00 PM  SpO2 97 % 02/05/2018  5:00 PM    Last Pain:  Vitals:   02/05/18 1425  TempSrc: Oral         Complications: No apparent anesthesia complications

## 2018-02-05 NOTE — H&P (Signed)
[facility] Vascular Consult Note  MRN : [id number]  Shane Duncan is a 67 y.o. (06-Mar-1951) male who presents with chief complaint of No chief complaint on file. Marland Kitchen  History of Present Illness: I am asked to see the patient by Dr. Donneta Romberg for temporal artery biopsy.  The patient has been followed for anemia and had been having headaches and visual symptoms over several months time.  This was predominantly on the left side.  No clear inciting event or causative factor that started the symptoms.  His CRP was elevated at 9.9 and his hematologist was very concerned he might have giant cell arteritis.  I was contacted Friday and we scheduled the procedure for today which was the next available opening in the operating room.  He was started on steroids and says he actually already feels better.  He had a good weekend with minimal complaints.  No fevers or chills.  Current Facility-Administered Medications  Medication Dose Route Frequency Provider Last Rate Last Dose  . clindamycin (CLEOCIN) 900 MG/50ML IVPB           . clindamycin (CLEOCIN) IVPB 900 mg  900 mg Intravenous On Call to OR Georgiana Spinner, NP      . HYDROmorphone (DILAUDID) injection 1 mg  1 mg Intravenous Once PRN Georgiana Spinner, NP      . lactated ringers infusion   Intravenous Continuous Naomie Dean, MD 75 mL/hr at 02/05/18 1429    . ondansetron (ZOFRAN) injection 4 mg  4 mg Intravenous Q6H PRN Georgiana Spinner, NP       MEDICATIONS:        Current Outpatient Medications  Medication Sig Dispense Refill  . ferrous sulfate 325 (65 FE) MG tablet Take 1 tablet (325 mg total) by mouth 2 (two) times daily with a meal. 60 tablet 3  . ibuprofen (ADVIL,MOTRIN) 400 MG tablet Take 400 mg by mouth every 6 (six) hours as needed.    Prednisone 80 mg daily started Friday  Past Medical History:  Diagnosis Date  . Bronchitis   . Chronic sinusitis   . Esophagitis   . Hemorrhoids   . Hepatitis A   .  Hyperlipidemia   . Hypertension   . IFG (impaired fasting glucose)   . Temporal arteritis (HCC) 01/2018    Past Surgical History:  Procedure Laterality Date  . ACHILLES TENDON SURGERY Right 1997  . ANUS SURGERY  1990's   anal fistula repair  . TONSILLECTOMY  1961    Social History Social History   Tobacco Use  . Smoking status: Never Smoker  . Smokeless tobacco: Never Used  Substance Use Topics  . Alcohol use: Yes    Alcohol/week: 2.0 - 3.0 standard drinks    Types: 2 - 3 Cans of beer per week  . Drug use: Yes    Types: Marijuana    Family History Family History  Problem Relation Age of Onset  . Cancer Father        Colon  . Hypertension Father   . Hyperlipidemia Father   . Ulcerative colitis Son     Allergies  Allergen Reactions  . Penicillins Rash     REVIEW OF SYSTEMS (Negative unless checked)  Constitutional: [] Weight loss  [] Fever  [] Chills Cardiac: [] Chest pain   [] Chest pressure   [] Palpitations   [] Shortness of breath when laying flat   [] Shortness of breath at rest   [] Shortness of breath with exertion. Vascular:  [] Pain  in legs with walking   [] Pain in legs at rest   [] Pain in legs when laying flat   [] Claudication   [] Pain in feet when walking  [] Pain in feet at rest  [] Pain in feet when laying flat   [] History of DVT   [] Phlebitis   [] Swelling in legs   [] Varicose veins   [] Non-healing ulcers Pulmonary:   [] Uses home oxygen   [] Productive cough   [] Hemoptysis   [] Wheeze  [] COPD   [] Asthma Neurologic:  [x] Dizziness  [] Blackouts   [] Seizures   [] History of stroke   [] History of TIA  [] Aphasia   [] Temporary blindness   [] Dysphagia   [] Weakness or numbness in arms   [] Weakness or numbness in legs Musculoskeletal:  [x] Arthritis   [] Joint swelling   [] Joint pain   [] Low back pain Hematologic:  [] Easy bruising  [] Easy bleeding   [] Hypercoagulable state   [x] Anemic  [] Hepatitis Gastrointestinal:  [] Blood in stool   [] Vomiting blood  [x] Gastroesophageal  reflux/heartburn   [] Difficulty swallowing. Genitourinary:  [] Chronic kidney disease   [] Difficult urination  [] Frequent urination  [] Burning with urination   [] Blood in urine Skin:  [] Rashes   [] Ulcers   [] Wounds Psychological:  [] History of anxiety   []  History of major depression.  Physical Examination  Vitals:   02/05/18 1425  BP: (!) 155/77  Pulse: 63  Resp: 14  Temp: (!) 97.1 F (36.2 C)  TempSrc: Oral  SpO2: 98%   There is no height or weight on file to calculate BMI. Gen:  WD/WN, NAD. Appears younger than stated age. Head: [place]/AT, No temporalis wasting. Prominent temp pulse not noted. Ear/Nose/Throat: Hearing grossly intact, nares w/o erythema or drainage, oropharynx w/o Erythema/Exudate Eyes: Sclera non-icteric, conjunctiva clear Neck: Trachea midline.  No JVD.  Pulmonary:  Good air movement, respirations not labored, equal bilaterally.  Cardiac: RRR, no JVD Vascular:  Vessel Right Left  Radial Palpable Palpable                                    Musculoskeletal: M/S 5/5 throughout.  Extremities without ischemic changes.  No deformity or atrophy. No edema. Neurologic: Sensation grossly intact in extremities.  Symmetrical.  Speech is fluent. Motor exam as listed above. Psychiatric: Judgment intact, Mood & affect appropriate for pt's clinical situation. Dermatologic: No rashes or ulcers noted.  No cellulitis or open wounds. Lymph : No Cervical, Axillary, or Inguinal lymphadenopathy.      CBC Lab Results  Component Value Date   WBC 7.8 02/01/2018   HGB 11.1 (L) 02/01/2018   HCT 36.1 (L) 02/01/2018   MCV 76.8 (L) 02/01/2018   PLT 385 02/01/2018    BMET    Component Value Date/Time   NA 138 02/02/2018 1155   NA 139 01/03/2018 1014   K 4.0 02/02/2018 1155   CL 106 02/02/2018 1155   CO2 24 02/02/2018 1155   GLUCOSE 176 (H) 02/02/2018 1155   BUN 12 02/02/2018 1155   BUN 11 01/03/2018 1014   CREATININE 0.76 02/02/2018 1155   CALCIUM 8.8 (L)  02/02/2018 1155   GFRNONAA >60 02/02/2018 1155   GFRAA >60 02/02/2018 1155   Estimated Creatinine Clearance: 106 mL/min (by C-G formula based on SCr of 0.76 mg/dL).  COAG Lab Results  Component Value Date   INR 1.20 02/02/2018    Radiology US Spleen (abdomen Limited)  Result Date: 02/02/2018 CLINICAL DATA:  Microscopic anemia, splenomegaly EXAM:  ULTRASOUND ABDOMEN LIMITED COMPARISON:  None. FINDINGS: Ultrasound of the left upper quadrant demonstrates normal size spleen, 7.5 cm in craniocaudal length. No focal splenic abnormality. IMPRESSION: No evidence of splenomegaly Electronically Signed   By: Charlett Nose M.D.   On: 02/02/2018 10:32      Assessment/Plan 1.  Visual symptoms and headaches concerning for temporal arteritis.  His hematologist/oncologist has recommended that we perform a temporal artery biopsy.  Risks and benefits of temporal artery biopsy were discussed with the patient and he is agreeable to proceed. 2.  Anemia.  Chronic.  Followed by hematology.  Spleen was normal size.  Hemoglobin last week was stable at 11 3.  Hypertension. Stable on outpatient medications and blood pressure control important in reducing the progression of atherosclerotic disease. On appropriate oral medications.    Festus Barren, MD  02/05/2018 3:07 PM    This note was created with Dragon medical transcription system.  Any error is purely unintentional

## 2018-02-05 NOTE — Op Note (Signed)
        OPERATIVE NOTE   PRE-OPERATIVE DIAGNOSIS: suspected temporal arteritis, headaches and visual changes  POST-OPERATIVE DIAGNOSIS: Same as above  PROCEDURE: 1.   Left temporal artery biopsy  SURGEON: Leotis Pain, MD  ASSISTANT(S): none  ANESTHESIA: MAC  ESTIMATED BLOOD LOSS: Minimal  FINDING(S): 1.  none  SPECIMEN(S):  Left superficial temporal artery sent to pathology  INDICATIONS:   Patient is a 67 y.o. male who presents with headaches, visual changes and concern for temporal arteritis.  We were consulted by his hematologist for consideration for temporal artery biopsy. Risks and benefits were discussed and he was agreeable to proceed.  DESCRIPTION: After obtaining full informed written consent, the patient was brought back to the operating room and placed supine upon the operating table.  The patient received IV antibiotics prior to induction.  After obtaining adequate anesthesia, the patient was prepped and draped in the standard fashion. The area in front of his left ear was anesthetized copiously with a solution of 1% lidocaine and half percent Marcaine without epinephrine. I then made an incision just in front of the left ear overlying the palpable pulse. I then dissected down through the subcutaneous tissues and identified the superficial temporal artery. This was dissected out over a several centimeters and branches were ligated and divided between silk ties. Care was used to avoid electrocautery around the artery. I then clamped the artery proximally and distally and transected the artery. The specimen was then sent to pathology. The proximal and distal artery were ligated with 3-0 silk ties. Hemostasis was achieved. The wound was then closed with a series of interrupted 3-0 Vicryl's and the skin was closed with a 4-0 Monocryl. Sterile dressing was placed. The patient was taken to the recovery room in stable condition having tolerated the procedure well.  COMPLICATIONS:  None  CONDITION: Stable   Leotis Pain 02/05/2018 4:57 PM  This note was created with Dragon Medical transcription system. Any errors in dictation are purely unintentional.

## 2018-02-06 ENCOUNTER — Encounter: Payer: Self-pay | Admitting: Vascular Surgery

## 2018-02-06 ENCOUNTER — Telehealth: Payer: Self-pay | Admitting: Internal Medicine

## 2018-02-06 LAB — JAK2 GENOTYPR

## 2018-02-06 NOTE — Telephone Encounter (Signed)
Patient needs a follow-up-with me end of this week/early next week; no labs.  Please schedule.  Thank you

## 2018-02-07 LAB — SURGICAL PATHOLOGY

## 2018-02-12 ENCOUNTER — Telehealth: Payer: Self-pay | Admitting: Internal Medicine

## 2018-02-12 ENCOUNTER — Telehealth: Payer: Self-pay | Admitting: Family Medicine

## 2018-02-12 ENCOUNTER — Inpatient Hospital Stay (HOSPITAL_BASED_OUTPATIENT_CLINIC_OR_DEPARTMENT_OTHER): Payer: Medicare Other | Admitting: Internal Medicine

## 2018-02-12 VITALS — BP 132/82 | HR 59 | Temp 97.8°F | Resp 16 | Wt 213.0 lb

## 2018-02-12 DIAGNOSIS — R5383 Other fatigue: Secondary | ICD-10-CM

## 2018-02-12 DIAGNOSIS — J309 Allergic rhinitis, unspecified: Secondary | ICD-10-CM | POA: Diagnosis not present

## 2018-02-12 DIAGNOSIS — R161 Splenomegaly, not elsewhere classified: Secondary | ICD-10-CM

## 2018-02-12 DIAGNOSIS — Z880 Allergy status to penicillin: Secondary | ICD-10-CM

## 2018-02-12 DIAGNOSIS — E785 Hyperlipidemia, unspecified: Secondary | ICD-10-CM | POA: Diagnosis not present

## 2018-02-12 DIAGNOSIS — Z800 Family history of malignant neoplasm of digestive organs: Secondary | ICD-10-CM

## 2018-02-12 DIAGNOSIS — D509 Iron deficiency anemia, unspecified: Secondary | ICD-10-CM

## 2018-02-12 DIAGNOSIS — D508 Other iron deficiency anemias: Secondary | ICD-10-CM

## 2018-02-12 DIAGNOSIS — R5381 Other malaise: Secondary | ICD-10-CM | POA: Diagnosis not present

## 2018-02-12 DIAGNOSIS — Z7952 Long term (current) use of systemic steroids: Secondary | ICD-10-CM | POA: Diagnosis not present

## 2018-02-12 DIAGNOSIS — R51 Headache: Secondary | ICD-10-CM

## 2018-02-12 DIAGNOSIS — I10 Essential (primary) hypertension: Secondary | ICD-10-CM

## 2018-02-12 DIAGNOSIS — Z79899 Other long term (current) drug therapy: Secondary | ICD-10-CM | POA: Diagnosis not present

## 2018-02-12 DIAGNOSIS — R42 Dizziness and giddiness: Secondary | ICD-10-CM | POA: Diagnosis not present

## 2018-02-12 NOTE — Progress Notes (Signed)
[facility] CONSULT NOTE  Patient Care Team: Dorcas Carrow, DO as PCP - General (Family Medicine)  CHIEF COMPLAINTS/PURPOSE OF CONSULTATION:  Anemia  #September 2019-microcytic anemia hemoglobin 10.5 MCV 75 [2017 normal]-iron studies saturation 16% ferritin 446 [PCP]  # Hx hemorrhoids/  colonoscopy sep 2015; [facility] [Dr.Levinson]  No history exists.     HISTORY OF PRESENTING ILLNESS:  Shane Duncan 67 y.o.  male referred to [place] for further evaluation of microcytic anemia is here for follow-up.  The interim patient underwent left temporal artery biopsy-given the concerns of giant cell arteritis.   Patient while awaiting biopsy results were started on prednisone 80 mg a day-he seems to have significant improvement of his headaches.  Appetite  is improving.  Energy levels improved.  Review of Systems  Constitutional: Negative for chills, diaphoresis and fever.  HENT: Negative for nosebleeds and sore throat.   Eyes: Negative for double vision.  Respiratory: Negative for cough, hemoptysis, sputum production, shortness of breath and wheezing.   Cardiovascular: Negative for chest pain, palpitations, orthopnea and leg swelling.  Gastrointestinal: Negative for abdominal pain, blood in stool, constipation, diarrhea, heartburn, melena, nausea and vomiting.  Genitourinary: Negative for dysuria, frequency and urgency.  Musculoskeletal: Negative for back pain and joint pain.  Skin: Negative.  Negative for itching and rash.  Neurological: Negative for tingling, focal weakness and weakness.  Endo/Heme/Allergies: Does not bruise/bleed easily.  Psychiatric/Behavioral: Negative for depression. The patient is not nervous/anxious and does not have insomnia.      MEDICAL HISTORY:  Past Medical History:  Diagnosis Date  . Bronchitis   . Chronic sinusitis   . Esophagitis   . Hemorrhoids   . Hepatitis A   . Hyperlipidemia   . Hypertension   . IFG (impaired fasting glucose)   .  Temporal arteritis (HCC) 01/2018    SURGICAL HISTORY: Past Surgical History:  Procedure Laterality Date  . ACHILLES TENDON SURGERY Right 1997  . ANUS SURGERY  1990's   anal fistula repair  . ARTERY BIOPSY Left 02/05/2018   Procedure: BIOPSY TEMPORAL ARTERY;  Surgeon: Annice Needy, MD;  Location: [facility] ORS;  Service: Vascular;  Laterality: Left;  . 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 Smokes mariguana. Lives in [place]. 2 children.  Social History   Socioeconomic History  . Marital status: Married    Spouse name: Not on file  . Number of children: Not on file  . Years of education: Not on file  . Highest education level: Not on file  Occupational History  . Not on file  Social Needs  . Financial resource strain: Not on file  . Food insecurity:    Worry: Not on file    Inability: Not on file  . Transportation needs:    Medical: Not on file    Non-medical: Not on file  Tobacco Use  . Smoking status: Never Smoker  . Smokeless tobacco: Never Used  Substance and Sexual Activity  . Alcohol use: Yes    Alcohol/week: 2.0 - 3.0 standard drinks    Types: 2 - 3 Cans of beer per week  . Drug use: Yes    Types: Marijuana  . Sexual activity: Yes  Lifestyle  . Physical activity:    Days per week: Not on file    Minutes per session: Not on file  . Stress: Not on file  Relationships  . Social connections:  Talks on phone: Not on file    Gets together: Not on file    Attends religious service: Not on file    Active member of club or organization: Not on file    Attends meetings of clubs or organizations: Not on file    Relationship status: Not on file  . Intimate partner violence:    Fear of current or ex partner: Not on file    Emotionally abused: Not on file    Physically abused: Not on file    Forced sexual activity: Not on file  Other Topics Concern  . Not on file  Social  History Narrative  . Not on file    FAMILY HISTORY: Family History  Problem Relation Age of Onset  . Cancer Father        Colon  . Hypertension Father   . Hyperlipidemia Father   . Ulcerative colitis Son     ALLERGIES:  is allergic to penicillins.  MEDICATIONS:  Current Outpatient Medications  Medication Sig Dispense Refill  . predniSONE (DELTASONE) 20 MG tablet Take 4 tablets (80 mg total) by mouth daily with breakfast. Take first dose today [10/18] 80 tablet 0  . HYDROcodone-acetaminophen (NORCO) 5-325 MG tablet Take 1 tablet by mouth every 6 (six) hours as needed for moderate pain. (Patient not taking: Reported on 02/12/2018) 30 tablet 0  . ibuprofen (ADVIL,MOTRIN) 400 MG tablet Take 400 mg by mouth every 6 (six) hours as needed.     No current facility-administered medications for this visit.       Marland Kitchen  PHYSICAL EXAMINATION: ECOG PERFORMANCE STATUS: 1 - Symptomatic but completely ambulatory  Vitals:   02/12/18 1331  BP: 132/82  Pulse: (!) 59  Resp: 16  Temp: 97.8 F (36.6 C)   Filed Weights   02/12/18 1331  Weight: 213 lb (96.6 kg)    Physical Exam  Constitutional: He is oriented to person, place, and time and well-developed, well-nourished, and in no distress.  HENT:  Head: Normocephalic and atraumatic.  Mouth/Throat: Oropharynx is clear and moist. No oropharyngeal exudate.  Eyes: Pupils are equal, round, and reactive to light.  Neck: Normal range of motion. Neck supple.  Cardiovascular: Normal rate and regular rhythm.  Pulmonary/Chest: No respiratory distress. He has no wheezes.  Abdominal: Soft. Bowel sounds are normal. He exhibits no distension and no mass. There is no tenderness. There is no rebound and no guarding.  Musculoskeletal: Normal range of motion. He exhibits no edema or tenderness.  Neurological: He is alert and oriented to person, place, and time.  Skin: Skin is warm.  Psychiatric: Affect normal.     LABORATORY DATA:  I have reviewed  the data as listed Lab Results  Component Value Date   WBC 7.8 02/01/2018   HGB 11.1 (L) 02/01/2018   HCT 36.1 (L) 02/01/2018   MCV 76.8 (L) 02/01/2018   PLT 385 02/01/2018   Recent Labs    12/26/17 1344 01/03/18 1014 02/02/18 1155  NA 134* 139 138  K 4.0 4.5 4.0  CL 100 101 106  CO2 24 21 24   GLUCOSE 109* 84 176*  BUN 12 11 12   CREATININE 1.05 0.83 0.76  CALCIUM 8.8* 8.9 8.8*  GFRNONAA >60 92 >60  GFRAA >60 107 >60  PROT  --  6.4  --   ALBUMIN  --  3.5*  --   AST  --  13  --   ALT  --  10  --   ALKPHOS  --  86  --   BILITOT  --  0.4  --     RADIOGRAPHIC STUDIES: I have personally reviewed the radiological images as listed and agreed with the findings in the report. US Spleen (abdomen Limited)  Result Date: 02/02/2018 CLINICAL DATA:  Microscopic anemia, splenomegaly EXAM: ULTRASOUND ABDOMEN LIMITED COMPARISON:  None. FINDINGS: Ultrasound of the left upper quadrant demonstrates normal size spleen, 7.5 cm in craniocaudal length. No focal splenic abnormality. IMPRESSION: No evidence of splenomegaly Electronically Signed   By: Charlett Nose M.D.   On: 02/02/2018 10:32    ASSESSMENT & PLAN:   Microcytic anemia # MICROCYTIC ANEMIA-secondary to inflammation /chronic disease [see discussion below]; Not iron deficiency-ferritin 446; iron saturation 16%.  Do not suspect primary bone marrow process-LDH/ultrasound spleen normal; Jak 2 normal.  CRP elevated at 10. [See below]  # headaches-? Giant cell arteritis-versus others.  Left temporal artery biopsy negative for arthritis.  However patient has had noted significant improvement of his headaches/fatigue after being on prednisone 80 mg a day for the last 10 days.  Recommend cutting down the dose to 60 mg a day for the 2 weeks; await follow-up with neurology.  Discussed with Dr. Sherryll Burger.  Patient will need to call his PCP for refills for prednisone.  Also left a message for Dr. Laural Benes.  # DISPOSITION: Follow up in 4 months/cbc/crp-  Dr.B  Cc; Dr.Johnson.   All questions were answered. The patient knows to call the clinic with any problems, questions or concerns.     Earna Coder, MD 02/12/2018 4:30 PM

## 2018-02-12 NOTE — Telephone Encounter (Signed)
I spoke with patient regarding Dr. Senaida Lange recommendations.  He states he called Dr. Alver Fisher office and the earliest appointment he could get is December 5th, and that will be with Dr. Malvin Johns. Their office advised him to call back next week to see if their had been any cancellations for an earlier appt.  Patient verbalized understanding.

## 2018-02-12 NOTE — Assessment & Plan Note (Addendum)
#   MICROCYTIC ANEMIA-secondary to inflammation /chronic disease [see discussion below]; Not iron deficiency-ferritin 446; iron saturation 16%.  Do not suspect primary bone marrow process-LDH/ultrasound spleen normal; Jak 2 normal.  CRP elevated at 10. [See below]  # headaches-? Giant cell arteritis-versus others.  Left temporal artery biopsy negative for arthritis.  However patient has had noted significant improvement of his headaches/fatigue after being on prednisone 80 mg a day for the last 10 days.  Recommend cutting down the dose to 60 mg a day for the 2 weeks; await follow-up with neurology.  Discussed with Dr. Sherryll Burger.  Patient will need to call his PCP for refills for prednisone.  Also left a message for Dr. Laural Benes.  # DISPOSITION: Follow up in 4 months/cbc/crp- Dr.B  Cc; Dr.Johnson.

## 2018-02-12 NOTE — Patient Instructions (Signed)
#   Call- Dr.Shah- Address: [address] [place], [place], [place] [address]; Phone: [phone] for appt.  # Take prednisone 40 mg/day;

## 2018-02-12 NOTE — Telephone Encounter (Signed)
Spoke with patient. Is is agreeable to come in for follow up. He will be in tomorrow. Pt aware to decrease prednisone 60 mg. Also stated that he had called and spoke with Dr. Sherryll Burger office and has that lined up for December.

## 2018-02-12 NOTE — Telephone Encounter (Signed)
Called by Dr. Riccardo Dubin who has been seeing Shane Duncan for anemia, thought to be due to inflammatory process- had TA biopsy which was negative, but feeing much better on the 80mg  prednisone. Dr. B has been tying to get him in with Dr. Sherryll Burger, but patient has not answered phone call. Recommended to decrease to 60mg  prednisone until he follows up with Dr. Sherryll Burger.   Can we get him in for a follow up here to keep an eye on things? Have him decrease to 60mg  of his prednisone daily and encourage him to follow up with Dr. Sherryll Burger. If he doesn't want to follow up with [place], he just needs to call Dr. Margaretmary Eddy office to get it scheduled. Thanks!

## 2018-02-12 NOTE — Telephone Encounter (Signed)
Shane Duncan/Shane Duncan-please inform patient that I I have spoken to Dr. Clelia Croft neurology-recommend take prednisone 60 mg a day starting tomorrow. He needs to call his PCP if running out of prednsione for refills.   It looks like Dr. Alver Fisher office try to reach him earlier for an appointment.  Please asked the patient to call Dr. Alver Fisher office for an appointment ASAP [I have given the patient Dr. Alver Fisher office number].   Also, inform him that I have reached out to his PCP too.

## 2018-02-13 ENCOUNTER — Ambulatory Visit (INDEPENDENT_AMBULATORY_CARE_PROVIDER_SITE_OTHER): Payer: Medicare Other | Admitting: Family Medicine

## 2018-02-13 ENCOUNTER — Encounter: Payer: Self-pay | Admitting: Family Medicine

## 2018-02-13 VITALS — BP 121/70 | HR 73 | Wt 213.0 lb

## 2018-02-13 DIAGNOSIS — M316 Other giant cell arteritis: Secondary | ICD-10-CM | POA: Insufficient documentation

## 2018-02-13 MED ORDER — PREDNISONE 20 MG PO TABS: ORAL_TABLET | ORAL | 0 refills | Status: DC

## 2018-02-13 NOTE — Patient Instructions (Signed)
Temporal Arteritis  Temporal arteritis, also called giant cell arteritis, is a condition that causes arteries to become swollen (inflamed). It usually affects arteries in your head and face, but arteries in any part of the body can become inflamed. Temporal arteritis can cause serious problems, such as bone loss, diabetes, and blindness. What are the causes? The cause is unknown. What increases the risk?  Being older than 50.  Being a woman.  Being Caucasian.  Being of Danish, Swedish, Finnish, Norwegian, or Icelandic ancestry.  Having polymyalgia rheumatica (PMR). What are the signs or symptoms? Some people with temporal arteritis have just one symptom, while other have several symptoms. Most signs and symptoms are related to the head and face. Signs and symptoms may include:  Hard or swollen temples (common). Your temples are the flattened area on either side of your forehead. If your temples are swollen, it may hurt to touch them.  Pain when combing your hair or when laying your head down.  Pain in the jaw when chewing.  Pain in the throat or tongue.  Problems with your vision, such as sudden loss of vision in one eye, or seeing double.  Fever.  Fatigue.  A dry cough.  Pain in the hips and shoulders.  Pain in the arms during exercise.  Depression.  Weight loss.  How is this diagnosed? Your health care provider will ask about your symptoms and do a physical exam. He or she may also perform an eye exam and tests, such as:  A complete blood count.  An erythrocyte sedimentation rate test, also called the sed rate test.  A C-reactive protein (CRP) test.  A tissue sample (biopsy) test.  How is this treated? Temporal arteritis is treated with a type of medicine called a corticosteroid. Vision problems may be treated with additional medicines. You will need to see your health care provider while you are being treated. During follow-up visits, your health care  provider will check for problems by:  Performing blood tests and bone density tests.  Checking your blood pressure and blood sugar.  Follow these instructions at home:  Take medicines only as directed by your health care provider.  Take any vitamins or supplements that your health care provider suggests. These may include vitamin D and calcium, which help keep your bones from becoming weak.  Exercise. Talk with your health care provider about what exercises are okay for you to do. Usually exercises that increase your heart rate (aerobic exercise), such as walking, are recommended. Aerobic exercise helps control your blood pressure and prevent bone loss.  Follow a healthy diet. Include healthy sources of protein, fruits, vegetables, and whole grains in your diet. Following a healthy diet helps prevent bone damage and diabetes. Contact a health care provider if:  Your symptoms get worse.  Your fever, fatigue, headache, weight loss, or pain in your jaw gets worse.  You develop signs of infection, such as fever, swelling, redness, warmth, and tenderness. Get help right away if:  Your vision gets worse.  Your pain does not go away, even after you take pain medicine.  You have chest pain.  You have trouble breathing.  One side of your face or body suddenly becomes weak or numb. This information is not intended to replace advice given to you by your health care provider. Make sure you discuss any questions you have with your health care provider. Document Released: 01/30/2009 Document Revised: 12/03/2015 Document Reviewed: 05/29/2013 Elsevier Interactive Patient Education  2018 [employer]   [employer]  

## 2018-02-13 NOTE — Assessment & Plan Note (Signed)
Will continue his prednisone at 60mg  for the next 2 weeks and then continue 40mg  until he sees neurology, which is about 3 weeks later. He will let us know if he gets into see [facility] neurology sooner. Call with any concerns.

## 2018-02-13 NOTE — Progress Notes (Signed)
BP 121/70   Pulse 73   Wt 213 lb (96.6 kg)   SpO2 100%   BMI 30.30 kg/m    Subjective:    Patient ID: Shane Duncan, male    DOB: 06/30/1950, 67 y.o.   MRN: [id number]  HPI: Shane Duncan is a 67 y.o. male  Chief Complaint  Patient presents with  . Temporal Arteritis   Recently diagnosed with temporal ateritis by hematology. They think this is the cause of his anemia. He was started on 80mg  of prednisone, which he has been on 80mg  of prednisone for the last 10 days. Hematology (Dr. B) has been coordinating with Dr. Sherryll Burger (Neurology) who is going to see him on 03/22/18. They recommended decreasing to 60mg  for 2 weeks. Continuing the prednisone until he sees neurology. He states that he is feeling great right now! No concerns. Going to try to get into see neurology at [facility] earlier than 12/5- will let us know if he gets an appointment. No more headaches or numbness in his face. Otherwise feeling well with no other concerns or complaints at this time.   Relevant past medical, surgical, family and social history reviewed and updated as indicated. Interim medical history since our last visit reviewed. Allergies and medications reviewed and updated.  Review of Systems  Constitutional: Negative.   Respiratory: Negative.   Cardiovascular: Negative.   Skin: Negative.   Psychiatric/Behavioral: Negative.     Per HPI unless specifically indicated above     Objective:    BP 121/70   Pulse 73   Wt 213 lb (96.6 kg)   SpO2 100%   BMI 30.30 kg/m   Wt Readings from Last 3 Encounters:  02/13/18 213 lb (96.6 kg)  02/12/18 213 lb (96.6 kg)  02/02/18 205 lb (93 kg)    Physical Exam  Constitutional: He is oriented to person, place, and time. He appears well-developed and well-nourished. No distress.  HENT:  Head: Normocephalic and atraumatic.  Right Ear: Hearing normal.  Left Ear: Hearing normal.  Nose: Nose normal.  Eyes: Conjunctivae and lids are normal. Right eye  exhibits no discharge. Left eye exhibits no discharge. No scleral icterus.  Cardiovascular: Normal rate, regular rhythm, normal heart sounds and intact distal pulses. Exam reveals no gallop and no friction rub.  No murmur heard. Pulmonary/Chest: Effort normal and breath sounds normal. No stridor. No respiratory distress. He has no wheezes. He has no rales. He exhibits no tenderness.  Musculoskeletal: Normal range of motion.  Neurological: He is alert and oriented to person, place, and time.  Skin: Skin is intact. No rash noted. He is not diaphoretic.  Psychiatric: He has a normal mood and affect. His speech is normal and behavior is normal. Judgment and thought content normal. Cognition and memory are normal.  Nursing note and vitals reviewed.   Results for orders placed or performed during the hospital encounter of 02/05/18  Urine Drug Screen, Qualitative ([facility] only)  Result Value Ref Range   Tricyclic, Ur Screen NONE DETECTED NONE DETECTED   Amphetamines, Ur Screen NONE DETECTED NONE DETECTED   MDMA (Ecstasy)Ur Screen NONE DETECTED NONE DETECTED   Cocaine Metabolite,Ur [place] NONE DETECTED NONE DETECTED   Opiate, Ur Screen NONE DETECTED NONE DETECTED   Phencyclidine (PCP) Ur S NONE DETECTED NONE DETECTED   Cannabinoid 50 Ng, Ur [place] POSITIVE (A) NONE DETECTED   Barbiturates, Ur Screen NONE DETECTED NONE DETECTED   Benzodiazepine, Ur Scrn NONE DETECTED NONE DETECTED   Methadone  Scn, Ur NONE DETECTED NONE DETECTED  Surgical pathology  Result Value Ref Range   SURGICAL PATHOLOGY      Surgical Pathology CASE: [id number] PATIENT: [facility] Surgical Pathology Report     SPECIMEN SUBMITTED: A. Temporal artery, left  CLINICAL HISTORY: Headache, elevated CRP  PRE-OPERATIVE DIAGNOSIS: Concern for temporal arteritis  POST-OPERATIVE DIAGNOSIS: Same as pre-op     DIAGNOSIS: A. TEMPORAL ARTERY, LEFT; BIOPSY: - NEGATIVE FOR ARTERITIS.  Comment: The segment of small  muscular artery was divided into 4 cross-sections and examined at 10 histologic levels. There is no evidence of inflammation.  A negative temporal artery biopsy does not rule out giant cell arteritis, as the test is less than 100% sensitive; estimates of the sensitivity of single biopsy are on the order of 80-90%. For this reason, the clinical presentation should be considered when making treatment decisions.  Dr. Wyn Quaker and Dr. Donneta Romberg were notified of the result on 02/07/2018 via [facility] Secure Chat.   GROSS DESCRIPTION: A. Labeled: Left temporal artery Received: In  formalin Size: 1.4 x 0.3 x 0.2 cm Other findings: S-shaped tan to brown luminal fragment  Block summary: 1 - submitted intact to be divided after histologic processing   Final Diagnosis performed by Ronald Lobo, MD.   Electronically signed 02/07/2018 2:09:27PM The electronic signature indicates that the named Attending Pathologist has evaluated the specimen  Technical component performed at [place], [address] [place], [place] [address] Lab: [phone] Dir: Jolene Schimke, MD, MMM  Professional component performed at [facility], [facility], [address] [place], [place], [place] [address] Lab: [phone] Dir: Georgiann Cocker. Rubinas, MD       Assessment & Plan:   Problem List Items Addressed This Visit      Cardiovascular and Mediastinum   Giant cell arteritis (HCC) - Primary    Will continue his prednisone at 60mg  for the next 2 weeks and then continue 40mg  until he sees neurology, which is about 3 weeks later. He will let us know if he gets into see [facility] neurology sooner. Call with any concerns.           Follow up plan: Return As scheduled.

## 2018-02-22 ENCOUNTER — Ambulatory Visit (INDEPENDENT_AMBULATORY_CARE_PROVIDER_SITE_OTHER): Payer: Medicare Other | Admitting: Nurse Practitioner

## 2018-02-22 ENCOUNTER — Telehealth: Payer: Self-pay | Admitting: Family Medicine

## 2018-02-22 ENCOUNTER — Encounter (INDEPENDENT_AMBULATORY_CARE_PROVIDER_SITE_OTHER): Payer: Self-pay | Admitting: Nurse Practitioner

## 2018-02-22 VITALS — BP 113/68 | HR 61 | Resp 17 | Ht 72.0 in | Wt 212.0 lb

## 2018-02-22 DIAGNOSIS — E785 Hyperlipidemia, unspecified: Secondary | ICD-10-CM | POA: Diagnosis not present

## 2018-02-22 DIAGNOSIS — I10 Essential (primary) hypertension: Secondary | ICD-10-CM

## 2018-02-22 DIAGNOSIS — R51 Headache: Principal | ICD-10-CM

## 2018-02-22 DIAGNOSIS — M316 Other giant cell arteritis: Secondary | ICD-10-CM | POA: Diagnosis not present

## 2018-02-22 DIAGNOSIS — R519 Headache, unspecified: Secondary | ICD-10-CM

## 2018-02-22 NOTE — Telephone Encounter (Signed)
Copied from CRM [id number]. Topic: Referral - Request for Referral >> Feb 21, 2018 10:59 AM Leafy Ro wrote: Has patient seen PCP for this complaint? yes  Pt would like a referral to [facility] dr Lovett Sox phone [phone]. Pt has medicare. Pt is having headaches in dizziness

## 2018-02-27 ENCOUNTER — Encounter (INDEPENDENT_AMBULATORY_CARE_PROVIDER_SITE_OTHER): Payer: Self-pay | Admitting: Nurse Practitioner

## 2018-02-27 NOTE — Progress Notes (Signed)
Subjective:    Patient ID: Shane Duncan, male    DOB: 04-27-1950, 67 y.o.   MRN: [id number] Chief Complaint  Patient presents with  . Follow-up    2 week     HPI  Shane Duncan is a 67 y.o. male that is following up for temporal artery biopsy.  The patient denies any issues following the biopsy.  He has not had any pain, drainage, swelling from the site.  The biopsy was negative.  The patient states that he will continue on steroids despite a negative result per the treating physician.  The patient denies any headaches, visual changes, or pain.  Patient denies any fevers, chills, nausea, vomiting.  Patient denies any chest pain or shortness of breath.  Past Medical History:  Diagnosis Date  . Bronchitis   . Chronic sinusitis   . Esophagitis   . Hemorrhoids   . Hepatitis A   . Hyperlipidemia   . Hypertension   . IFG (impaired fasting glucose)   . Temporal arteritis (HCC) 01/2018    Past Surgical History:  Procedure Laterality Date  . ACHILLES TENDON SURGERY Right 1997  . ANUS SURGERY  1990's   anal fistula repair  . ARTERY BIOPSY Left 02/05/2018   Procedure: BIOPSY TEMPORAL ARTERY;  Surgeon: Annice Needy, MD;  Location: [facility] ORS;  Service: Vascular;  Laterality: Left;  . TONSILLECTOMY  1961    Social History   Socioeconomic History  . Marital status: Married    Spouse name: Not on file  . Number of children: Not on file  . Years of education: Not on file  . Highest education level: Not on file  Occupational History  . Not on file  Social Needs  . Financial resource strain: Not on file  . Food insecurity:    Worry: Not on file    Inability: Not on file  . Transportation needs:    Medical: Not on file    Non-medical: Not on file  Tobacco Use  . Smoking status: Never Smoker  . Smokeless tobacco: Never Used  Substance and Sexual Activity  . Alcohol use: Yes    Alcohol/week: 2.0 - 3.0 standard drinks    Types: 2 - 3 Cans of beer per week  . Drug use:  Yes    Types: Marijuana  . Sexual activity: Yes  Lifestyle  . Physical activity:    Days per week: Not on file    Minutes per session: Not on file  . Stress: Not on file  Relationships  . Social connections:    Talks on phone: Not on file    Gets together: Not on file    Attends religious service: Not on file    Active member of club or organization: Not on file    Attends meetings of clubs or organizations: Not on file    Relationship status: Not on file  . Intimate partner violence:    Fear of current or ex partner: Not on file    Emotionally abused: Not on file    Physically abused: Not on file    Forced sexual activity: Not on file  Other Topics Concern  . Not on file  Social History Narrative  . Not on file    Family History  Problem Relation Age of Onset  . Cancer Father        Colon  . Hypertension Father   . Hyperlipidemia Father   . Ulcerative colitis Son  Allergies  Allergen Reactions  . Penicillins Rash     Review of Systems   Review of Systems: Negative Unless Checked Constitutional: [] Weight loss  [] Fever  [] Chills Cardiac: [] Chest pain   []  Atrial Fibrillation  [] Palpitations   [] Shortness of breath when laying flat   [] Shortness of breath with exertion. Vascular:  [] Pain in legs with walking   [] Pain in legs with standing  [] History of DVT   [] Phlebitis   [] Swelling in legs   [] Varicose veins   [] Non-healing ulcers Pulmonary:   [] Uses home oxygen   [] Productive cough   [] Hemoptysis   [] Wheeze  [] COPD   [] Asthma Neurologic:  [] Dizziness   [] Seizures   [] History of stroke   [] History of TIA  [] Aphasia   [] Vissual changes   [] Weakness or numbness in arm   [] Weakness or numbness in leg Musculoskeletal:   [] Joint swelling   [] Joint pain   [] Low back pain  []  History of Knee Replacement Hematologic:  [] Easy bruising  [] Easy bleeding   [] Hypercoagulable state   [] Anemic Gastrointestinal:  [] Diarrhea   [] Vomiting  [] Gastroesophageal reflux/heartburn    [] Difficulty swallowing. Genitourinary:  [] Chronic kidney disease   [] Difficult urination  [] Anuric   [] Blood in urine Skin:  [] Rashes   [] Ulcers  Psychological:  [] History of anxiety   []  History of major depression  []  Memory Difficulties     Objective:   Physical Exam  BP 113/68 (BP Location: Left Arm, Patient Position: Sitting)   Pulse 61   Resp 17   Ht 6' (1.829 m)   Wt 212 lb (96.2 kg)   BMI 28.75 kg/m   Gen: WD/WN, NAD Head: [place]/AT, No temporalis wasting.  Ear/Nose/Throat: Hearing grossly intact, nares w/o erythema or drainage Eyes: PER, EOMI, sclera nonicteric.  Neck: Supple, no masses.  No JVD.  Pulmonary:  Good air movement, no use of accessory muscles.  Cardiac: RRR Vascular:  Well-healed approximated incision on left face near ear.  Glue still present on incision. Vessel Right Left  Radial Palpable Palpable   Gastrointestinal: soft, non-distended. No guarding/no peritoneal signs.  Musculoskeletal: M/S 5/5 throughout.  No deformity or atrophy.  Neurologic: Pain and light touch intact in extremities.  Symmetrical.  Speech is fluent. Motor exam as listed above. Psychiatric: Judgment intact, Mood & affect appropriate for pt's clinical situation. Dermatologic: No Venous rashes. No Ulcers Noted.  No changes consistent with cellulitis. Lymph : No Cervical lymphadenopathy, no lichenification or skin changes of chronic lymphedema.      Assessment & Plan:   1. Giant cell arteritis (HCC) The biopsy was negative, however the patient states that his physician felt it was best to keep him on the steroid therapy for trans-arteritis and that his symptoms are greatly improved.  Advised patient to continue to use light soap and water when rinsing the area.  The glue would pill up and go away over time.  The patient was advised to contact us if he has any further issues or questions.  2. Essential hypertension Continue antihypertensive medications as already ordered, these  medications have been reviewed and there are no changes at this time.   3. Hyperlipidemia, unspecified hyperlipidemia type Continue statin as ordered and reviewed, no changes at this time    Current Outpatient Medications on File Prior to Visit  Medication Sig Dispense Refill  . predniSONE (DELTASONE) 20 MG tablet Take 60mg  from 10/29-11/12, then take 40mg  from 11/13 until you see the neurologist 80 tablet 0  . predniSONE (DELTASONE) 20 MG  tablet Take 4 tablets (80 mg total) by mouth daily with breakfast. Take first dose today [10/18] (Patient not taking: Reported on 02/22/2018) 80 tablet 0   No current facility-administered medications on file prior to visit.     There are no Patient Instructions on file for this visit. Return if symptoms worsen or fail to improve.   Georgiana Spinner, NP  This note was completed with Office manager.  Any errors are purely unintentional.

## 2018-02-28 DIAGNOSIS — M316 Other giant cell arteritis: Secondary | ICD-10-CM | POA: Diagnosis not present

## 2018-02-28 DIAGNOSIS — R350 Frequency of micturition: Secondary | ICD-10-CM | POA: Diagnosis not present

## 2018-02-28 DIAGNOSIS — G43819 Other migraine, intractable, without status migrainosus: Secondary | ICD-10-CM | POA: Diagnosis not present

## 2018-02-28 DIAGNOSIS — R51 Headache: Secondary | ICD-10-CM | POA: Diagnosis not present

## 2018-02-28 DIAGNOSIS — G44021 Chronic cluster headache, intractable: Secondary | ICD-10-CM | POA: Diagnosis not present

## 2018-03-17 ENCOUNTER — Other Ambulatory Visit: Payer: Self-pay | Admitting: Family Medicine

## 2018-03-19 NOTE — Telephone Encounter (Signed)
Yes please

## 2018-03-19 NOTE — Telephone Encounter (Signed)
Requested medication (s) are due for refill today: ?  Requested medication (s) are on the active medication list: yes  Last refill:  02/23/18  Future visit scheduled: yes  Notes to clinic:  Not delegated    Requested Prescriptions  Pending Prescriptions Disp Refills   predniSONE (DELTASONE) 10 MG tablet [Pharmacy Med Name: PREDNISONE 10 MG TABLET] 160 tablet 0    Sig: TAKE 60MG  FROM 10/29-11/12, THEN TAKE 40MG  FROM 11/13 UNTIL YOU SEE THE NEUROLOGIST     Not Delegated - Endocrinology:  Oral Corticosteroids Failed - 03/17/2018  9:23 AM      Failed - This refill cannot be delegated      Passed - Last BP in normal range    BP Readings from Last 1 Encounters:  02/22/18 113/68         Passed - Valid encounter within last 6 months    Recent Outpatient Visits          1 month ago Giant cell arteritis (HCC)   [facility] [place], [facility] P, [facility]   2 months ago [facility] [facility]   [facility] Johnson, [facility] P, [facility]   3 months ago Nonintractable headache, unspecified chronicity pattern, unspecified headache type   [facility] Trey SailorsPollak, Adriana M, PA-C   2 years ago Essential hypertension   [facility] [place], [place], DO      Future Appointments            In 3 months Laural BenesJohnson, Oralia RudMegan P, DO [facility]

## 2018-03-19 NOTE — Telephone Encounter (Signed)
Can we find out if he has enough to last him until his appointment with Dr. Malvin JohnsPotter on 12/5- if he does, Dr. Malvin JohnsPotter should continue the Rx if needed.

## 2018-03-19 NOTE — Telephone Encounter (Signed)
Spoke with [facility]. They are going to send [facility]. OV note was received and will be placed in provider's folder for review.

## 2018-03-19 NOTE — Telephone Encounter (Signed)
Called patient. He cancelled that appointment because he was able to get in w/ Dr. Sherryll BurgerShah sooner. Nothing in his chart. [facility] and confirmed w/ Holli he was seen on 02/28/18 by Dr. Sherryll BurgerShah. She is going to fax the note for this OV, should I contact the pharmacy to request they send this to Dr. Sherryll BurgerShah?

## 2018-03-20 ENCOUNTER — Other Ambulatory Visit: Payer: Self-pay | Admitting: Neurology

## 2018-03-20 DIAGNOSIS — G43819 Other migraine, intractable, without status migrainosus: Secondary | ICD-10-CM

## 2018-03-20 DIAGNOSIS — G44021 Chronic cluster headache, intractable: Secondary | ICD-10-CM

## 2018-04-02 ENCOUNTER — Other Ambulatory Visit: Payer: Self-pay | Admitting: Family Medicine

## 2018-04-02 NOTE — Telephone Encounter (Signed)
Requested medication (s) are due for refill today: Unsure  Requested medication (s) are on the active medication list: yes    Last refill: 02/13/18  #80  0 refills  Future visit scheduled no  Notes to clinic:not delegated  Requested Prescriptions  Pending Prescriptions Disp Refills   predniSONE (DELTASONE) 10 MG tablet [Pharmacy Med Name: PREDNISONE 10 MG TABLET] 160 tablet 0    Sig: TAKE 60MG  FROM 10/29-11/12, THEN TAKE 40MG  FROM 11/13 UNTIL YOU SEE THE NEUROLOGIST     Not Delegated - Endocrinology:  Oral Corticosteroids Failed - 04/02/2018  1:17 PM      Failed - This refill cannot be delegated      Passed - Last BP in normal range    BP Readings from Last 1 Encounters:  02/22/18 113/68         Passed - Valid encounter within last 6 months    Recent Outpatient Visits          1 month ago Giant cell arteritis (HCC)   [facility] [place], [place]   2 months ago [facility] preventive visit   [facility] Johnson, Megan P, DO   3 months ago Nonintractable headache, unspecified chronicity pattern, unspecified headache type   [facility] Trey SailorsPollak, Adriana M, PA-C   2 years ago Essential hypertension   [facility] [place], [place], DO      Future Appointments            In 2 months Laural BenesJohnson, Oralia RudMegan P, DO [facility]

## 2018-04-03 NOTE — Telephone Encounter (Signed)
Spoke with pt and he has spoken with them and it should be sent to the pharmacy.

## 2018-04-07 ENCOUNTER — Ambulatory Visit
Admission: RE | Admit: 2018-04-07 | Discharge: 2018-04-07 | Disposition: A | Discharge status: Home / Self Care | Payer: Medicare Other | Source: Ambulatory Visit | Attending: Neurology | Admitting: Neurology

## 2018-04-07 DIAGNOSIS — R42 Dizziness and giddiness: Secondary | ICD-10-CM | POA: Insufficient documentation

## 2018-04-07 DIAGNOSIS — G43819 Other migraine, intractable, without status migrainosus: Secondary | ICD-10-CM

## 2018-04-07 DIAGNOSIS — G44021 Chronic cluster headache, intractable: Secondary | ICD-10-CM | POA: Insufficient documentation

## 2018-04-07 DIAGNOSIS — H539 Unspecified visual disturbance: Secondary | ICD-10-CM | POA: Insufficient documentation

## 2018-04-07 DIAGNOSIS — R51 Headache: Secondary | ICD-10-CM | POA: Diagnosis not present

## 2018-04-07 LAB — POCT I-STAT CREATININE: Creatinine, Ser: 1 mg/dL (ref 0.61–1.24)

## 2018-04-07 MED ORDER — GADOBUTROL 1 MMOL/ML IV SOLN
9.0000 mL | Freq: Once | INTRAVENOUS | Status: AC | PRN
  Administered 2018-04-07: 9 mL via INTRAVENOUS

## 2018-04-26 DIAGNOSIS — R350 Frequency of micturition: Secondary | ICD-10-CM | POA: Diagnosis not present

## 2018-04-26 DIAGNOSIS — M316 Other giant cell arteritis: Secondary | ICD-10-CM | POA: Diagnosis not present

## 2018-05-09 DIAGNOSIS — Z7952 Long term (current) use of systemic steroids: Secondary | ICD-10-CM | POA: Diagnosis not present

## 2018-05-09 DIAGNOSIS — M316 Other giant cell arteritis: Secondary | ICD-10-CM | POA: Diagnosis not present

## 2018-05-28 DIAGNOSIS — R899 Unspecified abnormal finding in specimens from other organs, systems and tissues: Secondary | ICD-10-CM | POA: Diagnosis not present

## 2018-06-11 ENCOUNTER — Encounter: Payer: Self-pay | Admitting: Internal Medicine

## 2018-06-11 ENCOUNTER — Other Ambulatory Visit: Payer: Self-pay

## 2018-06-11 ENCOUNTER — Inpatient Hospital Stay (HOSPITAL_BASED_OUTPATIENT_CLINIC_OR_DEPARTMENT_OTHER): Payer: Medicare Other | Admitting: Internal Medicine

## 2018-06-11 ENCOUNTER — Inpatient Hospital Stay: Payer: Medicare Other | Attending: Internal Medicine

## 2018-06-11 VITALS — BP 153/81 | HR 83 | Temp 96.1°F | Resp 20 | Ht 72.0 in | Wt 217.0 lb

## 2018-06-11 DIAGNOSIS — I10 Essential (primary) hypertension: Secondary | ICD-10-CM

## 2018-06-11 DIAGNOSIS — R5383 Other fatigue: Secondary | ICD-10-CM

## 2018-06-11 DIAGNOSIS — R5381 Other malaise: Secondary | ICD-10-CM | POA: Diagnosis not present

## 2018-06-11 DIAGNOSIS — Z79899 Other long term (current) drug therapy: Secondary | ICD-10-CM | POA: Diagnosis not present

## 2018-06-11 DIAGNOSIS — R51 Headache: Secondary | ICD-10-CM | POA: Diagnosis not present

## 2018-06-11 DIAGNOSIS — D509 Iron deficiency anemia, unspecified: Secondary | ICD-10-CM

## 2018-06-11 DIAGNOSIS — E785 Hyperlipidemia, unspecified: Secondary | ICD-10-CM

## 2018-06-11 DIAGNOSIS — Z7952 Long term (current) use of systemic steroids: Secondary | ICD-10-CM | POA: Insufficient documentation

## 2018-06-11 DIAGNOSIS — D508 Other iron deficiency anemias: Secondary | ICD-10-CM | POA: Diagnosis not present

## 2018-06-11 LAB — CBC WITH DIFFERENTIAL/PLATELET
Abs Immature Granulocytes: 0.18 10*3/uL — ABNORMAL HIGH (ref 0.00–0.07)
BASOS PCT: 0 %
Basophils Absolute: 0 10*3/uL (ref 0.0–0.1)
EOS ABS: 0.1 10*3/uL (ref 0.0–0.5)
EOS PCT: 1 %
HCT: 44.5 % (ref 39.0–52.0)
Hemoglobin: 14.3 g/dL (ref 13.0–17.0)
Immature Granulocytes: 2 %
LYMPHS PCT: 20 %
Lymphs Abs: 1.9 10*3/uL (ref 0.7–4.0)
MCH: 27.6 pg (ref 26.0–34.0)
MCHC: 32.1 g/dL (ref 30.0–36.0)
MCV: 85.9 fL (ref 80.0–100.0)
Monocytes Absolute: 0.7 10*3/uL (ref 0.1–1.0)
Monocytes Relative: 7 %
NEUTROS ABS: 6.6 10*3/uL (ref 1.7–7.7)
NEUTROS PCT: 70 %
NRBC: 0 % (ref 0.0–0.2)
PLATELETS: 230 10*3/uL (ref 150–400)
RBC: 5.18 MIL/uL (ref 4.22–5.81)
RDW: 14 % (ref 11.5–15.5)
WBC: 9.4 10*3/uL (ref 4.0–10.5)

## 2018-06-11 LAB — C-REACTIVE PROTEIN: CRP: 1.6 mg/dL — ABNORMAL HIGH (ref <1.0)

## 2018-06-11 NOTE — Assessment & Plan Note (Addendum)
#   MICROCYTIC ANEMIA-secondary to inflammation /chronic disease [see discussion below]; improved.  Hemoglobin is 14.  # headaches-? Giant cell arteritis-versus others.  Currently on prednisone/azathioprine.  Improved not resolved.  Awaiting second opinion at [facility].  Continue follow-up with Dr. Sherryll Burger.  # DISPOSITION: # follow up as needed-Dr.B  Cc; Dr.Johnson.

## 2018-06-11 NOTE — Progress Notes (Signed)
[facility] CONSULT NOTE  Patient Care Team: Dorcas Carrow, DO as PCP - General (Family Medicine)  CHIEF COMPLAINTS/PURPOSE OF CONSULTATION:  Anemia  #September 2019-microcytic anemia hemoglobin 10.5 MCV 75 [2017 normal]-iron studies saturation 16% ferritin 446 [PCP]-temporal artery biopsy negative/Dr. Sherryll Burger; prednisone/cefepime.  # Hx hemorrhoids/  colonoscopy sep 2015; [facility] [Dr.Levinson]  No history exists.     HISTORY OF PRESENTING ILLNESS:  Shane Duncan 68 y.o.  male is here for follow-up of his microcytic anemia.  However further work-up showed significant inflammation question giant cell arteritis.  However biopsy negative.  Patient has been evaluated by neurology.  Also evaluated by rheumatology.  Patient is currently on prednisone/azathioprine.  His headaches have improved.  However not resolved.  Complains of fatigue.  Review of Systems  Constitutional: Positive for malaise/fatigue. Negative for chills, diaphoresis and fever.  HENT: Negative for nosebleeds and sore throat.   Eyes: Negative for double vision.  Respiratory: Negative for cough, hemoptysis, sputum production, shortness of breath and wheezing.   Cardiovascular: Negative for chest pain, palpitations, orthopnea and leg swelling.  Gastrointestinal: Negative for abdominal pain, blood in stool, constipation, diarrhea, heartburn, melena, nausea and vomiting.  Genitourinary: Negative for dysuria, frequency and urgency.  Musculoskeletal: Negative for back pain and joint pain.  Skin: Negative.  Negative for itching and rash.  Neurological: Positive for headaches. Negative for tingling, focal weakness and weakness.  Endo/Heme/Allergies: Does not bruise/bleed easily.  Psychiatric/Behavioral: Negative for depression. The patient is not nervous/anxious and does not have insomnia.      MEDICAL HISTORY:  Past Medical History:  Diagnosis Date  . Bronchitis   . Chronic sinusitis   . Esophagitis   .  Hemorrhoids   . Hepatitis A   . Hyperlipidemia   . Hypertension   . IFG (impaired fasting glucose)   . Temporal arteritis (HCC) 01/2018    SURGICAL HISTORY: Past Surgical History:  Procedure Laterality Date  . ACHILLES TENDON SURGERY Right 1997  . ANUS SURGERY  1990's   anal fistula repair  . ARTERY BIOPSY Left 02/05/2018   Procedure: BIOPSY TEMPORAL ARTERY;  Surgeon: Annice Needy, MD;  Location: [facility] ORS;  Service: Vascular;  Laterality: Left;  . 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 Smokes mariguana. Lives in [place]. 2 children.  Social History   Socioeconomic History  . Marital status: Married    Spouse name: Not on file  . Number of children: Not on file  . Years of education: Not on file  . Highest education level: Not on file  Occupational History  . Not on file  Social Needs  . Financial resource strain: Not on file  . Food insecurity:    Worry: Not on file    Inability: Not on file  . Transportation needs:    Medical: Not on file    Non-medical: Not on file  Tobacco Use  . Smoking status: Never Smoker  . Smokeless tobacco: Never Used  Substance and Sexual Activity  . Alcohol use: Yes    Alcohol/week: 2.0 - 3.0 standard drinks    Types: 2 - 3 Cans of beer per week  . Drug use: Yes    Types: Marijuana  . Sexual activity: Yes  Lifestyle  . Physical activity:    Days per week: Not on file    Minutes per session: Not on file  . Stress: Not on file  Relationships  .  Social connections:    Talks on phone: Not on file    Gets together: Not on file    Attends religious service: Not on file    Active member of club or organization: Not on file    Attends meetings of clubs or organizations: Not on file    Relationship status: Not on file  . Intimate partner violence:    Fear of current or ex partner: Not on file    Emotionally abused: Not on file    Physically  abused: Not on file    Forced sexual activity: Not on file  Other Topics Concern  . Not on file  Social History Narrative  . Not on file    FAMILY HISTORY: Family History  Problem Relation Age of Onset  . Cancer Father        Colon  . Hypertension Father   . Hyperlipidemia Father   . Ulcerative colitis Son     ALLERGIES:  is allergic to lactose and penicillins.  MEDICATIONS:  Current Outpatient Medications  Medication Sig Dispense Refill  . azaTHIOprine (IMURAN) 50 MG tablet Take by mouth.    . Multiple Vitamin (MULTIVITAMIN) capsule Take 1 capsule by mouth daily.    . predniSONE (DELTASONE) 20 MG tablet Take 4 tablets (80 mg total) by mouth daily with breakfast. Take first dose today [10/18] (Patient taking differently: Take 20 mg by mouth daily with breakfast. Take first dose today [10/18]) 80 tablet 0   No current facility-administered medications for this visit.       Marland Kitchen  PHYSICAL EXAMINATION: ECOG PERFORMANCE STATUS: 1 - Symptomatic but completely ambulatory  Vitals:   06/11/18 1037  BP: (!) 153/81  Pulse: 83  Resp: 20  Temp: (!) 96.1 F (35.6 C)   Filed Weights   06/11/18 1037  Weight: 217 lb (98.4 kg)    Physical Exam  Constitutional: He is oriented to person, place, and time and well-developed, well-nourished, and in no distress.  HENT:  Head: Normocephalic and atraumatic.  Mouth/Throat: Oropharynx is clear and moist. No oropharyngeal exudate.  Eyes: Pupils are equal, round, and reactive to light.  Neck: Normal range of motion. Neck supple.  Cardiovascular: Normal rate and regular rhythm.  Pulmonary/Chest: No respiratory distress. He has no wheezes.  Abdominal: Soft. Bowel sounds are normal. He exhibits no distension and no mass. There is no abdominal tenderness. There is no rebound and no guarding.  Musculoskeletal: Normal range of motion.        General: No tenderness or edema.  Neurological: He is alert and oriented to person, place, and time.   Skin: Skin is warm.  Psychiatric: Affect normal.     LABORATORY DATA:  I have reviewed the data as listed Lab Results  Component Value Date   WBC 9.4 06/11/2018   HGB 14.3 06/11/2018   HCT 44.5 06/11/2018   MCV 85.9 06/11/2018   PLT 230 06/11/2018   Recent Labs    12/26/17 1344 01/03/18 1014 02/02/18 1155 04/07/18 0951  NA 134* 139 138  --   K 4.0 4.5 4.0  --   CL 100 101 106  --   CO2 24 21 24   --   GLUCOSE 109* 84 176*  --   BUN 12 11 12   --   CREATININE 1.05 0.83 0.76 1.00  CALCIUM 8.8* 8.9 8.8*  --   GFRNONAA >60 92 >60  --   GFRAA >60 107 >60  --   PROT  --  6.4  --   --  ALBUMIN  --  3.5*  --   --   AST  --  13  --   --   ALT  --  10  --   --   ALKPHOS  --  86  --   --   BILITOT  --  0.4  --   --     RADIOGRAPHIC STUDIES: I have personally reviewed the radiological images as listed and agreed with the findings in the report. No results found.  ASSESSMENT & PLAN:   Microcytic anemia # MICROCYTIC ANEMIA-secondary to inflammation /chronic disease [see discussion below]; improved.  Hemoglobin is 14.  # headaches-? Giant cell arteritis-versus others.  Currently on prednisone/azathioprine.  Improved not resolved.  Awaiting second opinion at [facility].  Continue follow-up with Dr. Sherryll Burger.  # DISPOSITION: # follow up as needed-Dr.B  Cc; Dr.Johnson.   All questions were answered. The patient knows to call the clinic with any problems, questions or concerns.     Earna Coder, MD 06/11/2018 11:03 AM

## 2018-06-18 ENCOUNTER — Ambulatory Visit: Payer: Self-pay

## 2018-06-18 ENCOUNTER — Encounter: Payer: Self-pay | Admitting: Family Medicine

## 2018-06-18 ENCOUNTER — Ambulatory Visit (INDEPENDENT_AMBULATORY_CARE_PROVIDER_SITE_OTHER): Payer: Medicare Other | Admitting: Family Medicine

## 2018-06-18 VITALS — BP 121/83 | HR 116 | Temp 98.5°F | Wt 210.7 lb

## 2018-06-18 DIAGNOSIS — R52 Pain, unspecified: Secondary | ICD-10-CM

## 2018-06-18 DIAGNOSIS — L270 Generalized skin eruption due to drugs and medicaments taken internally: Secondary | ICD-10-CM | POA: Diagnosis not present

## 2018-06-18 DIAGNOSIS — L03312 Cellulitis of back [any part except buttock]: Secondary | ICD-10-CM | POA: Diagnosis not present

## 2018-06-18 DIAGNOSIS — R197 Diarrhea, unspecified: Secondary | ICD-10-CM | POA: Diagnosis not present

## 2018-06-18 LAB — VERITOR FLU A/B WAIVED
INFLUENZA A: NEGATIVE
INFLUENZA B: NEGATIVE

## 2018-06-18 MED ORDER — SULFAMETHOXAZOLE-TRIMETHOPRIM 800-160 MG PO TABS: 1.0000 | ORAL_TABLET | Freq: Two times a day (BID) | ORAL | 0 refills | Status: DC

## 2018-06-18 NOTE — Telephone Encounter (Signed)
Pt. Reports he started having body aches and fever last Tuesday. Had "a little diarrhea." No appetite. No fever since Saturday. States he is weak and has developed hives/whelps to his arms, legs and back. Appointment made for today per Christan.  Reason for Disposition . [1] Fever returns after gone for over 24 hours AND [2] symptoms worse or not improved  Answer Assessment - Initial Assessment Questions 1. WORST SYMPTOM: "What is your worst symptom?" (e.g., cough, runny nose, muscle aches, headache, sore throat, fever)      Headache, body aches,some diarrhea 2. ONSET: "When did your flu symptoms start?"      Last Tuesday night 3. COUGH: "How bad is the cough?"       No 4. RESPIRATORY DISTRESS: "Describe your breathing."      No 5. FEVER: "Do you have a fever?" If so, ask: "What is your temperature, how was it measured, and when did it start?"     No fever since Staurday 6. EXPOSURE: "Were you exposed to someone with influenza?"       No 7. FLU VACCINE: "Did you get a flu shot this year?"     No 8. HIGH RISK DISEASE: "Do you any chronic medical problems?" (e.g., heart or lung disease, asthma, weak immune system, or other HIGH RISK conditions)     No 9. PREGNANCY: "Is there any chance you are pregnant?" "When was your last menstrual period?"     n/a 10. OTHER SYMPTOMS: "Do you have any other symptoms?"  (e.g., runny nose, muscle aches, headache, sore throat)       Hives - arms.legs and back  Protocols used: INFLUENZA - SEASONAL-A-AH

## 2018-06-18 NOTE — Progress Notes (Signed)
BP 121/83   Pulse (!) 116   Temp 98.5 F (36.9 C) (Oral)   Wt 210 lb 11.2 oz (95.6 kg)   SpO2 98%   BMI 28.58 kg/m    Subjective:    Patient ID: Shane FreestoneKenneth F Duncan, male    DOB: 02-Sep-1950, 68 y.o.   MRN: [id number]  HPI: Bari MantisKenneth F Duncan is a 68 y.o. male  Chief Complaint  Patient presents with  . URI   Was sick about a week ago with fever, chills, and body aches. Thought he had the flu. Started feeling better then started on Saturday with diarrhea and joint pain  UPPER RESPIRATORY TRACT INFECTION Duration: 6 days- starting to feel better, fever started on Saturday Worst symptom: body aches, fever Fever: yes Cough: yes Shortness of breath: no Wheezing: no Chest pain: no Chest tightness: no Chest congestion: no Nasal congestion: no Runny nose: no Post nasal drip: no Sneezing: no Sore throat: no Swollen glands: no Sinus pressure: no Headache: no Face pain: no Toothache: no Ear pain: no  Ear pressure: no  Eyes red/itching:no Eye drainage/crusting: no  Vomiting: yes Rash: yes Fatigue: yes Sick contacts: no Strep contacts: no  Context: better Recurrent sinusitis: no Relief with OTC cold/cough medications: no  Treatments attempted: none   RASH Duration:  Saturday  Location: generalized  Itching: no Burning: no Redness: yes Oozing: yes Scaling: no Blisters: yes Painful: yes- feels like a bruise Fevers: yes Change in detergents/soaps/personal care products: no Recent illness: yes Recent travel:no History of same: no Context: worse Alleviating factors: nothing Treatments attempted:nothing Shortness of breath: no  Throat/tongue swelling: no Myalgias/arthralgias: yes  Relevant past medical, surgical, family and social history reviewed and updated as indicated. Interim medical history since our last visit reviewed. Allergies and medications reviewed and updated.  Review of Systems  Constitutional: Positive for appetite change, chills, fatigue and  fever. Negative for activity change, diaphoresis and unexpected weight change.  HENT: Negative.   Eyes: Negative.   Respiratory: Negative.   Cardiovascular: Negative.   Gastrointestinal: Positive for diarrhea. Negative for abdominal distention, abdominal pain, anal bleeding, blood in stool, constipation, nausea, rectal pain and vomiting.  Endocrine: Negative.   Genitourinary: Negative.   Musculoskeletal: Positive for arthralgias, back pain and myalgias. Negative for gait problem, joint swelling, neck pain and neck stiffness.  Skin: Positive for rash. Negative for color change, pallor and wound.  Allergic/Immunologic: Negative.   Neurological: Negative.   Psychiatric/Behavioral: Negative.     Per HPI unless specifically indicated above     Objective:    BP 121/83   Pulse (!) 116   Temp 98.5 F (36.9 C) (Oral)   Wt 210 lb 11.2 oz (95.6 kg)   SpO2 98%   BMI 28.58 kg/m   Wt Readings from Last 3 Encounters:  06/18/18 210 lb 11.2 oz (95.6 kg)  06/11/18 217 lb (98.4 kg)  02/22/18 212 lb (96.2 kg)    Physical Exam Vitals signs and nursing note reviewed.  Constitutional:      General: He is not in acute distress.    Appearance: Normal appearance. He is not ill-appearing, toxic-appearing or diaphoretic.  HENT:     Head: Normocephalic and atraumatic.     Right Ear: Tympanic membrane, ear canal and external ear normal. There is no impacted cerumen.     Left Ear: Tympanic membrane, ear canal and external ear normal. There is no impacted cerumen.     Nose: Nose normal. No congestion or rhinorrhea.  Mouth/Throat:     Mouth: Mucous membranes are moist.     Pharynx: Oropharynx is clear. No oropharyngeal exudate or posterior oropharyngeal erythema.  Eyes:     General: No scleral icterus.       Right eye: No discharge.        Left eye: No discharge.     Extraocular Movements: Extraocular movements intact.     Conjunctiva/sclera: Conjunctivae normal.     Pupils: Pupils are equal,  round, and reactive to light.  Neck:     Musculoskeletal: Normal range of motion and neck supple.  Cardiovascular:     Rate and Rhythm: Normal rate and regular rhythm.     Pulses: Normal pulses.     Heart sounds: Normal heart sounds. No murmur. No friction rub. No gallop.   Pulmonary:     Effort: Pulmonary effort is normal. No respiratory distress.     Breath sounds: Normal breath sounds. No stridor. No wheezing, rhonchi or rales.  Chest:     Chest wall: No tenderness.  Abdominal:     General: Abdomen is flat. Bowel sounds are normal. There is no distension.     Palpations: Abdomen is soft. There is no mass.     Tenderness: There is no abdominal tenderness. There is no right CVA tenderness, left CVA tenderness, guarding or rebound.     Hernia: No hernia is present.  Musculoskeletal: Normal range of motion.  Skin:    General: Skin is warm and dry.     Capillary Refill: Capillary refill takes less than 2 seconds.     Coloration: Skin is not jaundiced or pale.     Findings: Rash (pustular rash all over body, large erythematous patch with scaling and heat about 4 inches tall by 3 inches wide with sloughing in the center on L lower back, 2in by 3 inch area on L inner thigh) present. No bruising, erythema or lesion.  Neurological:     General: No focal deficit present.     Mental Status: He is alert and oriented to person, place, and time. Mental status is at baseline.  Psychiatric:        Mood and Affect: Mood normal.        Behavior: Behavior normal.        Thought Content: Thought content normal.        Judgment: Judgment normal.     Results for orders placed or performed in visit on 06/11/18  C-reactive protein  Result Value Ref Range   CRP 1.6 (H) <1.0 mg/dL  CBC with Differential/Platelet  Result Value Ref Range   WBC 9.4 4.0 - 10.5 K/uL   RBC 5.18 4.22 - 5.81 MIL/uL   Hemoglobin 14.3 13.0 - 17.0 g/dL   HCT 12.8 78.6 - 76.7 %   MCV 85.9 80.0 - 100.0 fL   MCH 27.6 26.0 -  34.0 pg   MCHC 32.1 30.0 - 36.0 g/dL   RDW 20.9 47.0 - 96.2 %   Platelets 230 150 - 400 K/uL   nRBC 0.0 0.0 - 0.2 %   Neutrophils Relative % 70 %   Neutro Abs 6.6 1.7 - 7.7 K/uL   Lymphocytes Relative 20 %   Lymphs Abs 1.9 0.7 - 4.0 K/uL   Monocytes Relative 7 %   Monocytes Absolute 0.7 0.1 - 1.0 K/uL   Eosinophils Relative 1 %   Eosinophils Absolute 0.1 0.0 - 0.5 K/uL   Basophils Relative 0 %   Basophils Absolute 0.0 0.0 -  0.1 K/uL   Immature Granulocytes 2 %   Abs Immature Granulocytes 0.18 (H) 0.00 - 0.07 K/uL      Assessment & Plan:   Problem List Items Addressed This Visit    None    Visit Diagnoses    Drug rash    -  Primary   Likely from imduran. Will stop. Already on prednisone. Monitor closely.   Body aches       Flu negative. Unclear if he had the flu or if this is due to reaction to imduran. We will stop imduran- note to neurology. Follow up Fri here, Thurs with them.   Relevant Orders   Veritor Flu A/B Waived   Diarrhea, unspecified type       Will check stool studies. Stop imduran. Call with any concerns.    Relevant Orders   Ova and parasite examination   Stool C-Diff Toxin Assay   Stool Culture   Fecal leukocytes   Cellulitis of back except buttock       Rash seems to be infected- will treat with bactrim and recheck on Friday at scheduled appointment.        Follow up plan: Return As scheduled on Friday.Marland Kitchen

## 2018-06-18 NOTE — Telephone Encounter (Signed)
Seen today. 

## 2018-06-21 DIAGNOSIS — Z79899 Other long term (current) drug therapy: Secondary | ICD-10-CM | POA: Diagnosis not present

## 2018-06-22 ENCOUNTER — Ambulatory Visit (INDEPENDENT_AMBULATORY_CARE_PROVIDER_SITE_OTHER): Payer: Medicare Other | Admitting: Family Medicine

## 2018-06-22 ENCOUNTER — Other Ambulatory Visit: Payer: Self-pay | Admitting: Family Medicine

## 2018-06-22 ENCOUNTER — Encounter: Payer: Self-pay | Admitting: Family Medicine

## 2018-06-22 ENCOUNTER — Other Ambulatory Visit: Payer: Self-pay

## 2018-06-22 VITALS — BP 118/75 | HR 96 | Temp 97.7°F | Ht 71.0 in | Wt 206.0 lb

## 2018-06-22 DIAGNOSIS — R197 Diarrhea, unspecified: Secondary | ICD-10-CM

## 2018-06-22 DIAGNOSIS — D509 Iron deficiency anemia, unspecified: Secondary | ICD-10-CM | POA: Diagnosis not present

## 2018-06-22 DIAGNOSIS — M316 Other giant cell arteritis: Secondary | ICD-10-CM | POA: Diagnosis not present

## 2018-06-22 DIAGNOSIS — E785 Hyperlipidemia, unspecified: Secondary | ICD-10-CM

## 2018-06-22 DIAGNOSIS — N401 Enlarged prostate with lower urinary tract symptoms: Secondary | ICD-10-CM | POA: Diagnosis not present

## 2018-06-22 DIAGNOSIS — N400 Enlarged prostate without lower urinary tract symptoms: Secondary | ICD-10-CM | POA: Insufficient documentation

## 2018-06-22 DIAGNOSIS — R351 Nocturia: Secondary | ICD-10-CM | POA: Diagnosis not present

## 2018-06-22 DIAGNOSIS — I10 Essential (primary) hypertension: Secondary | ICD-10-CM | POA: Diagnosis not present

## 2018-06-22 DIAGNOSIS — R7301 Impaired fasting glucose: Secondary | ICD-10-CM | POA: Diagnosis not present

## 2018-06-22 LAB — BAYER DCA HB A1C WAIVED: HB A1C (BAYER DCA - WAIVED): 6.5 % (ref <7.0)

## 2018-06-22 MED ORDER — TAMSULOSIN HCL 0.4 MG PO CAPS: 0.4000 mg | ORAL_CAPSULE | Freq: Every day | ORAL | 1 refills | Status: DC

## 2018-06-22 NOTE — Assessment & Plan Note (Signed)
Under good control off medicine. Continue to monitor. Call with any concerns. Continue to monitor.  

## 2018-06-22 NOTE — Assessment & Plan Note (Signed)
Rechecking levels today. Await results. Call with any concerns.  

## 2018-06-22 NOTE — Progress Notes (Signed)
BP 118/75   Pulse 96   Temp 97.7 F (36.5 C) (Oral)   Ht 5\' 11"  (1.803 m)   Wt 206 lb (93.4 kg)   SpO2 96%   BMI 28.73 kg/m    Subjective:    Patient ID: Shane Duncan, male    DOB: [birth year], 68 y.o.   MRN: [id number]  HPI: Shane Duncan is a 68 y.o. male  Chief Complaint  Patient presents with  . IFG    f/u   Rash is getting better off the imduran. He has an appointment to see neurology. Feeling better. Diarrhea calming down- now more constipated.   HYPERTENSION / HYPERLIPIDEMIA Satisfied with current treatment? yes Duration of hypertension: chronic BP monitoring frequency: not checking BP medication side effects: no Past BP meds: none Duration of hyperlipidemia: chronic Cholesterol medication side effects: not on anything Aspirin: no Recent stressors: yes Recurrent headaches: yes Visual changes: no Palpitations: no Dyspnea: no Chest pain: no Lower extremity edema: no Dizzy/lightheaded: no  Impaired Fasting Glucose HbA1C:  Lab Results  Component Value Date   HGBA1C 6.5 06/22/2018   Duration of elevated blood sugar:  chronic Polydipsia: no Polyuria: no Weight change: no Visual disturbance: no Glucose Monitoring: no Diabetic Education: Not Completed Family history of diabetes: yes  Relevant past medical, surgical, family and social history reviewed and updated as indicated. Interim medical history since our last visit reviewed. Allergies and medications reviewed and updated.  Review of Systems  Constitutional: Negative.   Respiratory: Negative.   Cardiovascular: Negative.   Musculoskeletal: Negative.   Skin: Positive for rash. Negative for color change, pallor and wound.  Neurological: Negative.   Psychiatric/Behavioral: Negative.     Per HPI unless specifically indicated above     Objective:    BP 118/75   Pulse 96   Temp 97.7 F (36.5 C) (Oral)   Ht 5\' 11"  (1.803 m)   Wt 206 lb (93.4 kg)   SpO2 96%   BMI 28.73 kg/m   Wt  Readings from Last 3 Encounters:  06/22/18 206 lb (93.4 kg)  06/18/18 210 lb 11.2 oz (95.6 kg)  06/11/18 217 lb (98.4 kg)    Physical Exam Vitals signs and nursing note reviewed.  Constitutional:      General: He is not in acute distress.    Appearance: Normal appearance. He is not ill-appearing, toxic-appearing or diaphoretic.  HENT:     Head: Normocephalic and atraumatic.     Right Ear: External ear normal.     Left Ear: External ear normal.     Nose: Nose normal.     Mouth/Throat:     Mouth: Mucous membranes are moist.     Pharynx: Oropharynx is clear.  Eyes:     General: No scleral icterus.       Right eye: No discharge.        Left eye: No discharge.     Extraocular Movements: Extraocular movements intact.     Conjunctiva/sclera: Conjunctivae normal.     Pupils: Pupils are equal, round, and reactive to light.  Neck:     Musculoskeletal: Normal range of motion and neck supple.  Cardiovascular:     Rate and Rhythm: Normal rate and regular rhythm.     Pulses: Normal pulses.     Heart sounds: Normal heart sounds. No murmur. No friction rub. No gallop.   Pulmonary:     Effort: Pulmonary effort is normal. No respiratory distress.     Breath sounds: Normal  breath sounds. No stridor. No wheezing, rhonchi or rales.  Chest:     Chest wall: No tenderness.  Musculoskeletal: Normal range of motion.  Skin:    General: Skin is warm and dry.     Capillary Refill: Capillary refill takes less than 2 seconds.     Coloration: Skin is not jaundiced or pale.     Findings: Rash (improving, spot on his back smaller starting to scab over) present. No bruising, erythema or lesion.  Neurological:     General: No focal deficit present.     Mental Status: He is alert and oriented to person, place, and time. Mental status is at baseline.  Psychiatric:        Mood and Affect: Mood normal.        Behavior: Behavior normal.        Thought Content: Thought content normal.        Judgment:  Judgment normal.     Results for orders placed or performed in visit on 06/22/18  Stool C-Diff Toxin Assay  Result Value Ref Range   C difficile Toxins A+B, EIA Negative Negative  Bayer DCA Hb A1c Waived  Result Value Ref Range   HB A1C (BAYER DCA - WAIVED) 6.5 <7.0 %  Comprehensive metabolic panel  Result Value Ref Range   Glucose 122 (H) 65 - 99 mg/dL   BUN 42 (H) 8 - 27 mg/dL   Creatinine, Ser 1.611.78 (H) 0.76 - 1.27 mg/dL   GFR calc non Af Amer 39 (L) >59 mL/min/1.73   GFR calc Af Amer 45 (L) >59 mL/min/1.73   BUN/Creatinine Ratio 24 10 - 24   Sodium 132 (L) 134 - 144 mmol/L   Potassium 4.1 3.5 - 5.2 mmol/L   Chloride 93 (L) 96 - 106 mmol/L   CO2 20 20 - 29 mmol/L   Calcium 8.8 8.6 - 10.2 mg/dL   Total Protein 5.9 (L) 6.0 - 8.5 g/dL   Albumin 2.8 (L) 3.8 - 4.8 g/dL   Globulin, Total 3.1 1.5 - 4.5 g/dL   Albumin/Globulin Ratio 0.9 (L) 1.2 - 2.2   Bilirubin Total 0.9 0.0 - 1.2 mg/dL   Alkaline Phosphatase 200 (H) 39 - 117 IU/L   AST 53 (H) 0 - 40 IU/L   ALT 35 0 - 44 IU/L  Lipid Panel w/o Chol/HDL Ratio  Result Value Ref Range   Cholesterol, Total 160 100 - 199 mg/dL   Triglycerides 096239 (H) 0 - 149 mg/dL   HDL 18 (L) >04>39 mg/dL   VLDL Cholesterol Cal 48 (H) 5 - 40 mg/dL   LDL Calculated 94 0 - 99 mg/dL  CBC with Differential/Platelet  Result Value Ref Range   WBC 34.5 (HH) 3.4 - 10.8 x10E3/uL   RBC 4.81 4.14 - 5.80 x10E6/uL   Hemoglobin 13.5 13.0 - 17.7 g/dL   Hematocrit 54.040.6 98.137.5 - 51.0 %   MCV 84 79 - 97 fL   MCH 28.1 26.6 - 33.0 pg   MCHC 33.3 31.5 - 35.7 g/dL   RDW 19.113.6 47.811.6 - 29.515.4 %   Platelets 320 150 - 450 x10E3/uL   Neutrophils 57 Not Estab. %   Lymphs 3 Not Estab. %   Monocytes 5 Not Estab. %   Eos 1 Not Estab. %   Basos 0 Not Estab. %   Immature Cells Note    Neutrophils Absolute 23.5 (H) 1.4 - 7.0 x10E3/uL   Lymphocytes Absolute 1.0 0.7 - 3.1 x10E3/uL   Monocytes Absolute 1.7 (  H) 0.1 - 0.9 x10E3/uL   EOS (ABSOLUTE) 0.3 0.0 - 0.4 x10E3/uL    Basophils Absolute 0.0 0.0 - 0.2 x10E3/uL   NRBC 1 (H) 0 - 0 %   Hematology Comments: Note:   Immature Cells  Result Value Ref Range   Bands(Auto) Relative 11 Not Estab. %   Metamyelocytes 20 (H) 0 - 0 %   MYELOCYTES 3 (H) 0 - 0 %      Assessment & Plan:   Problem List Items Addressed This Visit      Cardiovascular and Mediastinum   Hypertension - Primary    Under good control off medicine. Continue to monitor. Call with any concerns. Continue to monitor.       Relevant Orders   Comprehensive metabolic panel (Completed)   Giant cell arteritis (HCC)    Doing better. Continue to follow with neurology. No headaches. Continue to monitor.         Endocrine   IFG (impaired fasting glucose)   Relevant Orders   Bayer DCA Hb A1c Waived (Completed)   Comprehensive metabolic panel (Completed)     Genitourinary   BPH (benign prostatic hyperplasia)    Will start flomax. Call with any concerns. Continue to monitor.       Relevant Medications   tamsulosin (FLOMAX) 0.4 MG CAPS capsule     Other   Hyperlipidemia    Under good control off medicine. Continue to monitor. Call with any concerns. Continue to monitor.       Relevant Orders   Comprehensive metabolic panel (Completed)   Lipid Panel w/o Chol/HDL Ratio (Completed)   Microcytic anemia    Rechecking levels today. Await results. Call with any concerns.       Relevant Orders   CBC with Differential/Platelet (Completed)    Other Visit Diagnoses    Diarrhea, unspecified type       Will check stool studies. Stop imduran. Call with any concerns.        Follow up plan: Return in about 3 months (around 09/22/2018).

## 2018-06-22 NOTE — Assessment & Plan Note (Addendum)
Doing better. Continue to follow with neurology. No headaches. Continue to monitor.

## 2018-06-23 LAB — CBC WITH DIFFERENTIAL/PLATELET
Basophils Absolute: 0 10*3/uL (ref 0.0–0.2)
Basos: 0 %
EOS (ABSOLUTE): 0.3 10*3/uL (ref 0.0–0.4)
Eos: 1 %
Hematocrit: 40.6 % (ref 37.5–51.0)
Hemoglobin: 13.5 g/dL (ref 13.0–17.7)
LYMPHS: 3 %
Lymphocytes Absolute: 1 10*3/uL (ref 0.7–3.1)
MCH: 28.1 pg (ref 26.6–33.0)
MCHC: 33.3 g/dL (ref 31.5–35.7)
MCV: 84 fL (ref 79–97)
Monocytes Absolute: 1.7 10*3/uL — ABNORMAL HIGH (ref 0.1–0.9)
Monocytes: 5 %
NRBC: 1 % — ABNORMAL HIGH (ref 0–0)
Neutrophils Absolute: 23.5 10*3/uL — ABNORMAL HIGH (ref 1.4–7.0)
Neutrophils: 57 %
Platelets: 320 10*3/uL (ref 150–450)
RBC: 4.81 x10E6/uL (ref 4.14–5.80)
RDW: 13.6 % (ref 11.6–15.4)
WBC: 34.5 10*3/uL (ref 3.4–10.8)

## 2018-06-23 LAB — LIPID PANEL W/O CHOL/HDL RATIO
CHOLESTEROL TOTAL: 160 mg/dL (ref 100–199)
HDL: 18 mg/dL — AB (ref >39)
LDL Calculated: 94 mg/dL (ref 0–99)
TRIGLYCERIDES: 239 mg/dL — AB (ref 0–149)
VLDL CHOLESTEROL CAL: 48 mg/dL — AB (ref 5–40)

## 2018-06-23 LAB — COMPREHENSIVE METABOLIC PANEL
A/G RATIO: 0.9 — AB (ref 1.2–2.2)
ALT: 35 IU/L (ref 0–44)
AST: 53 IU/L — AB (ref 0–40)
Albumin: 2.8 g/dL — ABNORMAL LOW (ref 3.8–4.8)
Alkaline Phosphatase: 200 IU/L — ABNORMAL HIGH (ref 39–117)
BILIRUBIN TOTAL: 0.9 mg/dL (ref 0.0–1.2)
BUN/Creatinine Ratio: 24 (ref 10–24)
BUN: 42 mg/dL — AB (ref 8–27)
CHLORIDE: 93 mmol/L — AB (ref 96–106)
CO2: 20 mmol/L (ref 20–29)
Calcium: 8.8 mg/dL (ref 8.6–10.2)
Creatinine, Ser: 1.78 mg/dL — ABNORMAL HIGH (ref 0.76–1.27)
GFR calc non Af Amer: 39 mL/min/{1.73_m2} — ABNORMAL LOW (ref >59)
GFR, EST AFRICAN AMERICAN: 45 mL/min/{1.73_m2} — AB (ref >59)
GLUCOSE: 122 mg/dL — AB (ref 65–99)
Globulin, Total: 3.1 g/dL (ref 1.5–4.5)
POTASSIUM: 4.1 mmol/L (ref 3.5–5.2)
Sodium: 132 mmol/L — ABNORMAL LOW (ref 134–144)
TOTAL PROTEIN: 5.9 g/dL — AB (ref 6.0–8.5)

## 2018-06-23 LAB — IMMATURE CELLS
BANDS PCT: 11 %
MYELOCYTES: 3 % — ABNORMAL HIGH (ref 0–0)
Metamyelocytes: 20 % — ABNORMAL HIGH (ref 0–0)

## 2018-06-24 ENCOUNTER — Encounter: Payer: Self-pay | Admitting: Family Medicine

## 2018-06-24 LAB — CLOSTRIDIUM DIFFICILE EIA: C difficile Toxins A+B, EIA: NEGATIVE

## 2018-06-24 NOTE — Assessment & Plan Note (Signed)
Will start flomax. Call with any concerns. Continue to monitor.  °

## 2018-06-25 ENCOUNTER — Telehealth: Payer: Self-pay | Admitting: Family Medicine

## 2018-06-25 ENCOUNTER — Other Ambulatory Visit: Payer: Medicare Other

## 2018-06-25 DIAGNOSIS — N289 Disorder of kidney and ureter, unspecified: Secondary | ICD-10-CM

## 2018-06-25 DIAGNOSIS — D72829 Elevated white blood cell count, unspecified: Secondary | ICD-10-CM

## 2018-06-25 NOTE — Telephone Encounter (Signed)
Please let him know that his labs came back showing dehydration and his white blood cell count quite high. This could be from an infection or his prednisone, so I'd like to know if he's feeling better, and have him come back in today (preferably) or tomorrow AM for a recheck on his blood count and kidney function. Thanks!

## 2018-06-25 NOTE — Telephone Encounter (Signed)
Thank you :)

## 2018-06-25 NOTE — Telephone Encounter (Signed)
Patient states he is feeling better. His rash is improving. States he is just fatigued now. He will come back today to have blood redrawn.

## 2018-06-26 ENCOUNTER — Telehealth: Payer: Self-pay | Admitting: Family Medicine

## 2018-06-26 DIAGNOSIS — D72829 Elevated white blood cell count, unspecified: Secondary | ICD-10-CM

## 2018-06-26 LAB — CBC WITH DIFFERENTIAL/PLATELET
Basophils Absolute: 0 10*3/uL (ref 0.0–0.2)
Basos: 0 %
EOS (ABSOLUTE): 0 10*3/uL (ref 0.0–0.4)
Eos: 0 %
Hematocrit: 36.6 % — ABNORMAL LOW (ref 37.5–51.0)
Hemoglobin: 12.6 g/dL — ABNORMAL LOW (ref 13.0–17.7)
Lymphocytes Absolute: 1 10*3/uL (ref 0.7–3.1)
Lymphs: 5 %
MCH: 29.2 pg (ref 26.6–33.0)
MCHC: 34.4 g/dL (ref 31.5–35.7)
MCV: 85 fL (ref 79–97)
MONOS ABS: 0.6 10*3/uL (ref 0.1–0.9)
Monocytes: 3 %
Neutrophils Absolute: 17.8 10*3/uL — ABNORMAL HIGH (ref 1.4–7.0)
Neutrophils: 91 %
Platelets: 504 10*3/uL — ABNORMAL HIGH (ref 150–450)
RBC: 4.32 x10E6/uL (ref 4.14–5.80)
RDW: 13.5 % (ref 11.6–15.4)
WBC: 19.6 10*3/uL — AB (ref 3.4–10.8)

## 2018-06-26 LAB — COMPREHENSIVE METABOLIC PANEL
ALT: 35 IU/L (ref 0–44)
AST: 26 IU/L (ref 0–40)
Albumin/Globulin Ratio: 0.9 — ABNORMAL LOW (ref 1.2–2.2)
Albumin: 3.1 g/dL — ABNORMAL LOW (ref 3.8–4.8)
Alkaline Phosphatase: 157 IU/L — ABNORMAL HIGH (ref 39–117)
BUN/Creatinine Ratio: 18 (ref 10–24)
BUN: 19 mg/dL (ref 8–27)
Bilirubin Total: 0.6 mg/dL (ref 0.0–1.2)
CO2: 22 mmol/L (ref 20–29)
Calcium: 8.8 mg/dL (ref 8.6–10.2)
Chloride: 95 mmol/L — ABNORMAL LOW (ref 96–106)
Creatinine, Ser: 1.08 mg/dL (ref 0.76–1.27)
GFR calc Af Amer: 83 mL/min/{1.73_m2} (ref >59)
GFR, EST NON AFRICAN AMERICAN: 72 mL/min/{1.73_m2} (ref >59)
Globulin, Total: 3.3 g/dL (ref 1.5–4.5)
Glucose: 187 mg/dL — ABNORMAL HIGH (ref 65–99)
Potassium: 5.4 mmol/L — ABNORMAL HIGH (ref 3.5–5.2)
SODIUM: 133 mmol/L — AB (ref 134–144)
Total Protein: 6.4 g/dL (ref 6.0–8.5)

## 2018-06-26 LAB — IMMATURE CELLS: Metamyelocytes: 1 % — ABNORMAL HIGH (ref 0–0)

## 2018-06-26 NOTE — Telephone Encounter (Signed)
Please let him know that his kidney function is much much better. His white blood count is better, but it is still high. I'd like him to come back on Wednesday or Thursday for [place] to recheck it. (order in)

## 2018-06-26 NOTE — Telephone Encounter (Signed)
Patient notified of lab results

## 2018-06-27 ENCOUNTER — Other Ambulatory Visit: Payer: Self-pay

## 2018-06-27 ENCOUNTER — Other Ambulatory Visit: Payer: Medicare Other

## 2018-06-27 DIAGNOSIS — D72829 Elevated white blood cell count, unspecified: Secondary | ICD-10-CM | POA: Diagnosis not present

## 2018-06-28 LAB — CBC WITH DIFFERENTIAL/PLATELET
Basophils Absolute: 0.1 10*3/uL (ref 0.0–0.2)
Basos: 0 %
EOS (ABSOLUTE): 0 10*3/uL (ref 0.0–0.4)
EOS: 0 %
Hematocrit: 38.1 % (ref 37.5–51.0)
Hemoglobin: 12.3 g/dL — ABNORMAL LOW (ref 13.0–17.7)
Immature Grans (Abs): 0.8 10*3/uL — ABNORMAL HIGH (ref 0.0–0.1)
Immature Granulocytes: 5 %
Lymphocytes Absolute: 1 10*3/uL (ref 0.7–3.1)
Lymphs: 6 %
MCH: 27.3 pg (ref 26.6–33.0)
MCHC: 32.3 g/dL (ref 31.5–35.7)
MCV: 85 fL (ref 79–97)
Monocytes Absolute: 0.4 10*3/uL (ref 0.1–0.9)
Monocytes: 3 %
Neutrophils Absolute: 15 10*3/uL — ABNORMAL HIGH (ref 1.4–7.0)
Neutrophils: 86 %
Platelets: 595 10*3/uL — ABNORMAL HIGH (ref 150–450)
RBC: 4.5 x10E6/uL (ref 4.14–5.80)
RDW: 13.8 % (ref 11.6–15.4)
WBC: 17.2 10*3/uL — ABNORMAL HIGH (ref 3.4–10.8)

## 2018-06-29 ENCOUNTER — Other Ambulatory Visit: Payer: Self-pay | Admitting: Family Medicine

## 2018-06-29 DIAGNOSIS — D72829 Elevated white blood cell count, unspecified: Secondary | ICD-10-CM

## 2018-06-30 LAB — OVA AND PARASITE EXAMINATION

## 2018-07-04 LAB — STOOL CULTURE

## 2018-07-04 LAB — FECAL LEUKOCYTES

## 2018-07-05 DIAGNOSIS — Z79899 Other long term (current) drug therapy: Secondary | ICD-10-CM | POA: Diagnosis not present

## 2018-07-11 ENCOUNTER — Telehealth: Payer: Self-pay | Admitting: Family Medicine

## 2018-07-11 NOTE — Telephone Encounter (Signed)
Copied from CRM [id number]. Topic: Quick Communication - See Telephone Encounter >> Jul 11, 2018  9:08 AM Fanny Bien wrote: CRM for notification. See Telephone encounter for: 07/11/18. Pt called and stated that he had blood work done at [facility]. Pt states that WBC 8.5 RBC 4.05 Hemoglobin - 11.5. pt states that he shared this on his [facility] portal. Pt would like a call back if we need to talk to him. Please advise

## 2018-07-11 NOTE — Telephone Encounter (Signed)
I can see them through care everywhere. Please let him know that that is much better. No need to recheck it right now. Thanks!

## 2018-07-11 NOTE — Telephone Encounter (Signed)
Called and left patient a VM letting him know what Dr. Laural Benes said.

## 2018-07-18 DIAGNOSIS — R51 Headache: Secondary | ICD-10-CM | POA: Diagnosis not present

## 2018-07-20 DIAGNOSIS — G4459 Other complicated headache syndrome: Secondary | ICD-10-CM | POA: Diagnosis not present

## 2018-07-20 DIAGNOSIS — M316 Other giant cell arteritis: Secondary | ICD-10-CM | POA: Diagnosis not present

## 2018-08-02 DIAGNOSIS — Z79899 Other long term (current) drug therapy: Secondary | ICD-10-CM | POA: Diagnosis not present

## 2018-08-30 DIAGNOSIS — G4459 Other complicated headache syndrome: Secondary | ICD-10-CM | POA: Diagnosis not present

## 2018-09-11 DIAGNOSIS — M316 Other giant cell arteritis: Secondary | ICD-10-CM | POA: Diagnosis not present

## 2018-09-12 DIAGNOSIS — G8929 Other chronic pain: Secondary | ICD-10-CM | POA: Diagnosis not present

## 2018-09-12 DIAGNOSIS — M25561 Pain in right knee: Secondary | ICD-10-CM | POA: Diagnosis not present

## 2018-09-12 DIAGNOSIS — M25562 Pain in left knee: Secondary | ICD-10-CM | POA: Diagnosis not present

## 2018-09-17 DIAGNOSIS — M316 Other giant cell arteritis: Secondary | ICD-10-CM | POA: Diagnosis not present

## 2018-09-17 DIAGNOSIS — Z79899 Other long term (current) drug therapy: Secondary | ICD-10-CM | POA: Diagnosis not present

## 2018-09-17 DIAGNOSIS — R51 Headache: Secondary | ICD-10-CM | POA: Diagnosis not present

## 2018-09-25 ENCOUNTER — Encounter: Payer: Self-pay | Admitting: Family Medicine

## 2018-09-25 ENCOUNTER — Ambulatory Visit (INDEPENDENT_AMBULATORY_CARE_PROVIDER_SITE_OTHER): Payer: Medicare Other | Admitting: Family Medicine

## 2018-09-25 ENCOUNTER — Other Ambulatory Visit: Payer: Self-pay

## 2018-09-25 VITALS — BP 150/95 | HR 87 | Temp 97.9°F

## 2018-09-25 DIAGNOSIS — N401 Enlarged prostate with lower urinary tract symptoms: Secondary | ICD-10-CM | POA: Diagnosis not present

## 2018-09-25 DIAGNOSIS — Z8709 Personal history of other diseases of the respiratory system: Secondary | ICD-10-CM | POA: Diagnosis not present

## 2018-09-25 DIAGNOSIS — D509 Iron deficiency anemia, unspecified: Secondary | ICD-10-CM | POA: Diagnosis not present

## 2018-09-25 DIAGNOSIS — E119 Type 2 diabetes mellitus without complications: Secondary | ICD-10-CM

## 2018-09-25 DIAGNOSIS — R351 Nocturia: Secondary | ICD-10-CM

## 2018-09-25 LAB — BAYER DCA HB A1C WAIVED: HB A1C (BAYER DCA - WAIVED): 5.7 % (ref <7.0)

## 2018-09-25 MED ORDER — TAMSULOSIN HCL 0.4 MG PO CAPS: 0.4000 mg | ORAL_CAPSULE | Freq: Every day | ORAL | 1 refills | Status: DC

## 2018-09-25 NOTE — Assessment & Plan Note (Signed)
Checking labs today. Await results. Call with any concerns.  

## 2018-09-25 NOTE — Assessment & Plan Note (Signed)
Due to prednisone. A1c last check 6.5- weaning off prednisone. Continue to monitor. Call with any concerns.

## 2018-09-25 NOTE — Assessment & Plan Note (Signed)
Under good control on current regimen. Continue current regimen. Continue to monitor. Call with any concerns. Refills given.   

## 2018-09-25 NOTE — Progress Notes (Signed)
BP (!) 150/95   Pulse 87   Temp 97.9 F (36.6 C) (Oral)   SpO2 96%    Subjective:    Patient ID: Shane Duncan, male    DOB: 1951-03-30, 68 y.o.   MRN: [id number]  HPI: Shane Duncan is a 68 y.o. male  Chief Complaint  Patient presents with  . Benign Prostatic Hypertrophy  . Diabetes   BPH BPH status: controlled Satisfied with current treatment?: yes Medication side effects: no Medication compliance: excellent compliance Duration: chronic Nocturia: 1/night Urinary frequency:no Incomplete voiding: no Urgency: no Weak urinary stream: no Straining to start stream: no Dysuria: no Onset: gradual Severity: mild  DIABETES- has been on prednisone due  Hypoglycemic episodes:no Polydipsia/polyuria: no Visual disturbance: no Chest pain: no Paresthesias: no Glucose Monitoring: no  Accucheck frequency: Not Checking Taking Insulin?: no Blood Pressure Monitoring: not checking Retinal Examination: Not up to Date Foot Exam: Up to Date Diabetic Education: Completed Pneumovax: Not up to Date Influenza: Up to Date Aspirin: no  Relevant past medical, surgical, family and social history reviewed and updated as indicated. Interim medical history since our last visit reviewed. Allergies and medications reviewed and updated.  Review of Systems  Constitutional: Negative.   Respiratory: Negative.   Cardiovascular: Negative.   Gastrointestinal: Negative.   Genitourinary: Negative.   Psychiatric/Behavioral: Negative.     Per HPI unless specifically indicated above     Objective:    BP (!) 150/95   Pulse 87   Temp 97.9 F (36.6 C) (Oral)   SpO2 96%   Wt Readings from Last 3 Encounters:  06/22/18 206 lb (93.4 kg)  06/18/18 210 lb 11.2 oz (95.6 kg)  06/11/18 217 lb (98.4 kg)    Physical Exam Vitals signs and nursing note reviewed.  Constitutional:      General: He is not in acute distress.    Appearance: Normal appearance. He is not ill-appearing,  toxic-appearing or diaphoretic.  HENT:     Head: Normocephalic and atraumatic.     Right Ear: External ear normal.     Left Ear: External ear normal.     Nose: Nose normal.     Mouth/Throat:     Mouth: Mucous membranes are moist.     Pharynx: Oropharynx is clear.  Eyes:     General: No scleral icterus.       Right eye: No discharge.        Left eye: No discharge.     Extraocular Movements: Extraocular movements intact.     Conjunctiva/sclera: Conjunctivae normal.     Pupils: Pupils are equal, round, and reactive to light.  Neck:     Musculoskeletal: Normal range of motion and neck supple.  Cardiovascular:     Rate and Rhythm: Normal rate and regular rhythm.     Pulses: Normal pulses.     Heart sounds: Normal heart sounds. No murmur. No friction rub. No gallop.   Pulmonary:     Effort: Pulmonary effort is normal. No respiratory distress.     Breath sounds: Normal breath sounds. No stridor. No wheezing, rhonchi or rales.  Chest:     Chest wall: No tenderness.  Musculoskeletal: Normal range of motion.  Skin:    General: Skin is warm and dry.     Capillary Refill: Capillary refill takes less than 2 seconds.     Coloration: Skin is not jaundiced or pale.     Findings: No bruising, erythema, lesion or rash.  Neurological:  General: No focal deficit present.     Mental Status: He is alert and oriented to person, place, and time. Mental status is at baseline.  Psychiatric:        Mood and Affect: Mood normal.        Behavior: Behavior normal.        Thought Content: Thought content normal.        Judgment: Judgment normal.     Results for orders placed or performed in visit on 06/27/18  CBC with Differential/Platelet  Result Value Ref Range   WBC 17.2 (H) 3.4 - 10.8 x10E3/uL   RBC 4.50 4.14 - 5.80 x10E6/uL   Hemoglobin 12.3 (L) 13.0 - 17.7 g/dL   Hematocrit 38.1 37.5 - 51.0 %   MCV 85 79 - 97 fL   MCH 27.3 26.6 - 33.0 pg   MCHC 32.3 31.5 - 35.7 g/dL   RDW 13.8 11.6 -  15.4 %   Platelets 595 (H) 150 - 450 x10E3/uL   Neutrophils 86 Not Estab. %   Lymphs 6 Not Estab. %   Monocytes 3 Not Estab. %   Eos 0 Not Estab. %   Basos 0 Not Estab. %   Neutrophils Absolute 15.0 (H) 1.4 - 7.0 x10E3/uL   Lymphocytes Absolute 1.0 0.7 - 3.1 x10E3/uL   Monocytes Absolute 0.4 0.1 - 0.9 x10E3/uL   EOS (ABSOLUTE) 0.0 0.0 - 0.4 x10E3/uL   Basophils Absolute 0.1 0.0 - 0.2 x10E3/uL   Immature Granulocytes 5 Not Estab. %   Immature Grans (Abs) 0.8 (H) 0.0 - 0.1 x10E3/uL      Assessment & Plan:   Problem List Items Addressed This Visit      Endocrine   Diet-controlled diabetes mellitus ([place])    Due to prednisone. A1c last check 6.5- weaning off prednisone. Continue to monitor. Call with any concerns.         Genitourinary   BPH (benign prostatic hyperplasia) - Primary    Under good control on current regimen. Continue current regimen. Continue to monitor. Call with any concerns. Refills given.        Relevant Medications   tamsulosin (FLOMAX) 0.4 MG CAPS capsule   Other Relevant Orders   Bayer DCA Hb A1c Waived     Other   Microcytic anemia    Checking labs today. Await results. Call with any concerns       Relevant Orders   CBC with Differential/Platelet   Iron and TIBC   Ferritin    Other Visit Diagnoses    History of upper respiratory infection       Sick in March. Would like covid testing. Drawn today.   Relevant Orders   SAR CoV2 Serology (COVID 19)AB(IGG)IA       Follow up plan: Return in about 6 months (around 03/27/2019) for Physical.

## 2018-09-26 LAB — CBC WITH DIFFERENTIAL/PLATELET
Basophils Absolute: 0.1 10*3/uL (ref 0.0–0.2)
Basos: 1 %
EOS (ABSOLUTE): 0.5 10*3/uL — ABNORMAL HIGH (ref 0.0–0.4)
Eos: 5 %
Hematocrit: 41.6 % (ref 37.5–51.0)
Hemoglobin: 13.6 g/dL (ref 13.0–17.7)
Immature Grans (Abs): 0.1 10*3/uL (ref 0.0–0.1)
Immature Granulocytes: 1 %
Lymphocytes Absolute: 1.1 10*3/uL (ref 0.7–3.1)
Lymphs: 10 %
MCH: 26.6 pg (ref 26.6–33.0)
MCHC: 32.7 g/dL (ref 31.5–35.7)
MCV: 81 fL (ref 79–97)
Monocytes Absolute: 0.7 10*3/uL (ref 0.1–0.9)
Monocytes: 7 %
Neutrophils Absolute: 8.5 10*3/uL — ABNORMAL HIGH (ref 1.4–7.0)
Neutrophils: 76 %
Platelets: 307 10*3/uL (ref 150–450)
RBC: 5.11 x10E6/uL (ref 4.14–5.80)
RDW: 13.1 % (ref 11.6–15.4)
WBC: 11 10*3/uL — ABNORMAL HIGH (ref 3.4–10.8)

## 2018-09-26 LAB — FERRITIN: Ferritin: 238 ng/mL (ref 30–400)

## 2018-09-26 LAB — SAR COV2 SEROLOGY (COVID19)AB(IGG),IA: SARS-CoV-2 Ab, IgG: NEGATIVE

## 2018-09-26 LAB — IRON AND TIBC
Iron Saturation: 19 % (ref 15–55)
Iron: 48 ug/dL (ref 38–169)
Total Iron Binding Capacity: 249 ug/dL — ABNORMAL LOW (ref 250–450)
UIBC: 201 ug/dL (ref 111–343)

## 2018-10-10 DIAGNOSIS — M791 Myalgia, unspecified site: Secondary | ICD-10-CM | POA: Diagnosis not present

## 2018-10-10 DIAGNOSIS — M316 Other giant cell arteritis: Secondary | ICD-10-CM | POA: Diagnosis not present

## 2018-10-10 DIAGNOSIS — Z7952 Long term (current) use of systemic steroids: Secondary | ICD-10-CM | POA: Diagnosis not present

## 2018-10-10 DIAGNOSIS — M2550 Pain in unspecified joint: Secondary | ICD-10-CM | POA: Diagnosis not present

## 2018-10-10 DIAGNOSIS — Z13820 Encounter for screening for osteoporosis: Secondary | ICD-10-CM | POA: Insufficient documentation

## 2018-10-30 DIAGNOSIS — Z7952 Long term (current) use of systemic steroids: Secondary | ICD-10-CM | POA: Diagnosis not present

## 2018-10-30 DIAGNOSIS — M316 Other giant cell arteritis: Secondary | ICD-10-CM | POA: Diagnosis not present

## 2018-10-30 DIAGNOSIS — M2550 Pain in unspecified joint: Secondary | ICD-10-CM | POA: Diagnosis not present

## 2018-11-07 DIAGNOSIS — M316 Other giant cell arteritis: Secondary | ICD-10-CM | POA: Diagnosis not present

## 2018-11-13 DIAGNOSIS — M791 Myalgia, unspecified site: Secondary | ICD-10-CM | POA: Diagnosis not present

## 2018-11-13 DIAGNOSIS — M2550 Pain in unspecified joint: Secondary | ICD-10-CM | POA: Diagnosis not present

## 2018-11-30 DIAGNOSIS — M1380 Other specified arthritis, unspecified site: Secondary | ICD-10-CM | POA: Diagnosis not present

## 2018-11-30 DIAGNOSIS — M2550 Pain in unspecified joint: Secondary | ICD-10-CM | POA: Diagnosis not present

## 2018-12-04 DIAGNOSIS — R52 Pain, unspecified: Secondary | ICD-10-CM | POA: Diagnosis not present

## 2018-12-04 DIAGNOSIS — G4459 Other complicated headache syndrome: Secondary | ICD-10-CM | POA: Diagnosis not present

## 2018-12-12 ENCOUNTER — Telehealth: Payer: Self-pay | Admitting: Family Medicine

## 2018-12-12 DIAGNOSIS — M1990 Unspecified osteoarthritis, unspecified site: Secondary | ICD-10-CM

## 2018-12-12 NOTE — Telephone Encounter (Signed)
"  some issues" is not helpful- please find out what's going on so I can put in a reason for the referral.

## 2018-12-12 NOTE — Telephone Encounter (Signed)
Patient states that in July he went from having temporal arteritis, now rheum has diagnosed him with myalgia and arthralgia now inflammatory arthritis. He would like a second opinion. He is on methotrexate now.

## 2018-12-12 NOTE — Telephone Encounter (Signed)
Copied from [place] [phone] [id number]. Topic: Referral - Request for Referral >> Dec 07, 2018 11:21 AM Jeri Cos wrote: Has patient seen PCP for this complaint? Yes.   *If NO, is insurance requiring patient see PCP for this issue before PCP can refer them? Referral for which specialty: Arthritis Preferred provider/office: [facility] - fax # [phone] / Attn: [facility] Scheduling Office Reason for referral: Having some issues

## 2018-12-18 ENCOUNTER — Telehealth: Payer: Self-pay

## 2018-12-18 NOTE — Telephone Encounter (Signed)
Received a fax from [facility]) stating the referral was incomplete. They were requesting records, labs, a form to be filled out and documents to be sent from our practice to them.  [facility]. Stated we sent over referral because we are his PCP, however he was never truly evaluated by [place] for rheumatology and was initially referred by [facility] to [facility]. Stated I could not release their labs, progress notes, etc because it's not from our practice.  Sunday Spillers with referrals stated that what needed to happen is the patient needed to sign a release for [facility] to be sent to [facility]. Sunday Spillers stated that she would contact patient in regards to his previous rheumatologist.  Routing to provider as Juluis Rainier.

## 2018-12-18 NOTE — Telephone Encounter (Signed)
Noted. Thanks.

## 2018-12-31 DIAGNOSIS — M2550 Pain in unspecified joint: Secondary | ICD-10-CM | POA: Diagnosis not present

## 2018-12-31 DIAGNOSIS — M1380 Other specified arthritis, unspecified site: Secondary | ICD-10-CM | POA: Diagnosis not present

## 2019-01-03 DIAGNOSIS — Z79899 Other long term (current) drug therapy: Secondary | ICD-10-CM | POA: Diagnosis not present

## 2019-01-03 DIAGNOSIS — M1380 Other specified arthritis, unspecified site: Secondary | ICD-10-CM | POA: Diagnosis not present

## 2019-01-15 ENCOUNTER — Ambulatory Visit (INDEPENDENT_AMBULATORY_CARE_PROVIDER_SITE_OTHER): Payer: Medicare Other | Admitting: Family Medicine

## 2019-01-15 ENCOUNTER — Encounter: Payer: Self-pay | Admitting: Family Medicine

## 2019-01-15 ENCOUNTER — Other Ambulatory Visit: Payer: Self-pay

## 2019-01-15 ENCOUNTER — Telehealth: Payer: Self-pay

## 2019-01-15 DIAGNOSIS — M1380 Other specified arthritis, unspecified site: Secondary | ICD-10-CM | POA: Diagnosis not present

## 2019-01-15 MED ORDER — TRAMADOL HCL 50 MG PO TABS: 50.0000 mg | ORAL_TABLET | Freq: Three times a day (TID) | ORAL | 0 refills | Status: AC | PRN

## 2019-01-15 MED ORDER — NORTRIPTYLINE HCL 25 MG PO CAPS: 25.0000 mg | ORAL_CAPSULE | Freq: Every day | ORAL | 3 refills | Status: DC

## 2019-01-15 NOTE — Progress Notes (Signed)
There were no vitals taken for this visit.   Subjective:    Patient ID: Shane Duncan, male    DOB: [birth year], 68 y.o.   MRN: [id number]  HPI: Shane Duncan is a 68 y.o. male  Chief Complaint  Patient presents with  . Follow-up  . Insomnia    pain during the night due to arthritis   Has been following with rheumatology- saw them about 2 weeks ago. He just had his methotrexate increased to 6 tabs weekly. He is not noticing much of a difference yet- just started the increased dose last weekend. He notes that he cannot bend his hand. He is having pain in his joints. He notes that he has been having a lot of swelling and pain. He has numbness and tingling and that makes him feel pretty awful. He has been trying z-quil, niquil, skelaxin, ambien- none of them have particularly helped. He has been having some pretty severe paresthesias and pain in his wrists. Nothing is helping. Sleeping makes it worse. No other concerns or complaints at this time.   Relevant past medical, surgical, family and social history reviewed and updated as indicated. Interim medical history since our last visit reviewed. Allergies and medications reviewed and updated.  Review of Systems  Constitutional: Positive for fatigue. Negative for activity change, appetite change, chills, diaphoresis, fever and unexpected weight change.  Respiratory: Negative.   Cardiovascular: Negative.   Musculoskeletal: Positive for arthralgias, joint swelling and myalgias. Negative for back pain, gait problem, neck pain and neck stiffness.  Skin: Negative.   Neurological: Positive for numbness. Negative for dizziness, tremors, seizures, syncope, facial asymmetry, speech difficulty, weakness, light-headedness and headaches.  Hematological: Negative.   Psychiatric/Behavioral: Negative.     Per HPI unless specifically indicated above     Objective:    There were no vitals taken for this visit.  Wt Readings from Last 3  Encounters:  06/22/18 206 lb (93.4 kg)  06/18/18 210 lb 11.2 oz (95.6 kg)  06/11/18 217 lb (98.4 kg)    Physical Exam Vitals signs and nursing note reviewed.  Pulmonary:     Effort: Pulmonary effort is normal. No respiratory distress.     Comments: Speaking in full sentences Neurological:     Mental Status: He is alert.  Psychiatric:        Mood and Affect: Mood normal.        Behavior: Behavior normal.        Thought Content: Thought content normal.        Judgment: Judgment normal.     Results for orders placed or performed in visit on 09/25/18  SAR CoV2 Serology (COVID 19)AB(IGG)IA  Result Value Ref Range   SARS-CoV-2 Ab, IgG Negative Negative  Bayer DCA Hb A1c Waived  Result Value Ref Range   HB A1C (BAYER DCA - WAIVED) 5.7 <7.0 %  CBC with Differential/Platelet  Result Value Ref Range   WBC 11.0 (H) 3.4 - 10.8 x10E3/uL   RBC 5.11 4.14 - 5.80 x10E6/uL   Hemoglobin 13.6 13.0 - 17.7 g/dL   Hematocrit 62.9 47.6 - 51.0 %   MCV 81 79 - 97 fL   MCH 26.6 26.6 - 33.0 pg   MCHC 32.7 31.5 - 35.7 g/dL   RDW 54.6 50.3 - 54.6 %   Platelets 307 150 - 450 x10E3/uL   Neutrophils 76 Not Estab. %   Lymphs 10 Not Estab. %   Monocytes 7 Not Estab. %   Eos 5  Not Estab. %   Basos 1 Not Estab. %   Neutrophils Absolute 8.5 (H) 1.4 - 7.0 x10E3/uL   Lymphocytes Absolute 1.1 0.7 - 3.1 x10E3/uL   Monocytes Absolute 0.7 0.1 - 0.9 x10E3/uL   EOS (ABSOLUTE) 0.5 (H) 0.0 - 0.4 x10E3/uL   Basophils Absolute 0.1 0.0 - 0.2 x10E3/uL   Immature Granulocytes 1 Not Estab. %   Immature Grans (Abs) 0.1 0.0 - 0.1 x10E3/uL  Iron and TIBC  Result Value Ref Range   Total Iron Binding Capacity 249 (L) 250 - 450 ug/dL   UIBC 201 111 - 343 ug/dL   Iron 48 38 - 169 ug/dL   Iron Saturation 19 15 - 55 %  Ferritin  Result Value Ref Range   Ferritin 238 30 - 400 ng/mL      Assessment & Plan:   Problem List Items Addressed This Visit      Musculoskeletal and Integument   Seronegative arthritis     Following with rheumatology. They are working on getting his dose of methotrexate correct. Pain in joints is keeping him up at night. Will start nortriptyline for the paresthesias and tramadol for pain- recheck 10-14 days. Call with any concerns.       Relevant Medications   methotrexate (RHEUMATREX) 2.5 MG tablet   naproxen sodium (ALEVE) 220 MG tablet   acetaminophen (TYLENOL) 500 MG tablet   traMADol (ULTRAM) 50 MG tablet       Follow up plan: Return 10-14 days.   . This visit was completed via telephone due to the restrictions of the COVID-19 pandemic. All issues as above were discussed and addressed but no physical exam was performed. If it was felt that the patient should be evaluated in the office, they were directed there. The patient verbally consented to this visit. Patient was unable to complete an audio/visual visit due to Lack of equipment. Due to the catastrophic nature of the COVID-19 pandemic, this visit was done through audio contact only. . Location of the patient: home . Location of the provider: work . Those involved with this call:  . Provider: Park Liter, DO . CMA: Tiffany Reel, CMA . Front Desk/Registration: Don Perking  . Time spent on call: 21 minutes on the phone discussing health concerns. 23 minutes total spent in review of patient's record and preparation of their chart.

## 2019-01-15 NOTE — Telephone Encounter (Signed)
Tried to call Rheumatology, already closed, will try again.   Copied from [place] [phone]. Topic: General - Other >> Jan 15, 2019  2:02 PM Rainey Pines A wrote: Lovey Newcomer from [facility] would like a callback today in regards to a fax that was sent on 8/28 and 9/11. Lovey Newcomer can be reached at [phone]

## 2019-01-15 NOTE — Assessment & Plan Note (Signed)
Following with rheumatology. They are working on getting his dose of methotrexate correct. Pain in joints is keeping him up at night. Will start nortriptyline for the paresthesias and tramadol for pain- recheck 10-14 days. Call with any concerns.

## 2019-01-16 ENCOUNTER — Ambulatory Visit (INDEPENDENT_AMBULATORY_CARE_PROVIDER_SITE_OTHER): Payer: Medicare Other

## 2019-01-16 ENCOUNTER — Other Ambulatory Visit: Payer: Self-pay

## 2019-01-16 DIAGNOSIS — Z23 Encounter for immunization: Secondary | ICD-10-CM

## 2019-01-16 NOTE — Telephone Encounter (Signed)
Shane Duncan's number is [phone]. Left a voicemail on her personal machine.

## 2019-01-18 NOTE — Telephone Encounter (Signed)
Unable to reach, will close this message.

## 2019-01-21 ENCOUNTER — Ambulatory Visit (INDEPENDENT_AMBULATORY_CARE_PROVIDER_SITE_OTHER): Payer: Medicare Other

## 2019-01-21 VITALS — BP 106/81 | Wt 210.0 lb

## 2019-01-21 DIAGNOSIS — Z0000 Encounter for general adult medical examination without abnormal findings: Secondary | ICD-10-CM

## 2019-01-21 NOTE — Patient Instructions (Signed)
[name] , Thank you for taking time to come for your Medicare Wellness Visit. I appreciate your ongoing commitment to your health goals. Please review the following plan we discussed and let me know if I can assist you in the future.   Screening recommendations/referrals: Colonoscopy: contact your gastroenterologist about scheduling a colonoscopy  Recommended yearly ophthalmology/optometry visit for glaucoma screening and checkup Recommended yearly dental visit for hygiene and checkup  Vaccinations: Influenza vaccine: up to date Pneumococcal vaccine: up to date Tdap vaccine: up to date Shingles vaccine: shingrix eligible     Advanced directives: Please bring a copy of your health care power of attorney and living will to the office at your convenience.  Conditions/risks identified: Make sure to drink at least 6-8 glasses of water a day.   Next appointment: Follow up in one year for your annual wellness visit   Preventive Care 65 Years and Older, Male Preventive care refers to lifestyle choices and visits with your health care provider that can promote health and wellness. What does preventive care include?  A yearly physical exam. This is also called an annual well check.  Dental exams once or twice a year.  Routine eye exams. Ask your health care provider how often you should have your eyes checked.  Personal lifestyle choices, including:  Daily care of your teeth and gums.  Regular physical activity.  Eating a healthy diet.  Avoiding tobacco and drug use.  Limiting alcohol use.  Practicing safe sex.  Taking low doses of aspirin every day.  Taking vitamin and mineral supplements as recommended by your health care provider. What happens during an annual well check? The services and screenings done by your health care provider during your annual well check will depend on your age, overall health, lifestyle risk factors, and family history of disease. Counseling   Your health care provider may ask you questions about your:  Alcohol use.  Tobacco use.  Drug use.  Emotional well-being.  Home and relationship well-being.  Sexual activity.  Eating habits.  History of falls.  Memory and ability to understand (cognition).  Work and work Statistician. Screening  You may have the following tests or measurements:  Height, weight, and BMI.  Blood pressure.  Lipid and cholesterol levels. These may be checked every 5 years, or more frequently if you are over 10 years old.  Skin check.  Lung cancer screening. You may have this screening every year starting at age 23 if you have a 30-pack-year history of smoking and currently smoke or have quit within the past 15 years.  Fecal occult blood test (FOBT) of the stool. You may have this test every year starting at age 62.  Flexible sigmoidoscopy or colonoscopy. You may have a sigmoidoscopy every 5 years or a colonoscopy every 10 years starting at age 60.  Prostate cancer screening. Recommendations will vary depending on your family history and other risks.  Hepatitis C blood test.  Hepatitis B blood test.  Sexually transmitted disease (STD) testing.  Diabetes screening. This is done by checking your blood sugar (glucose) after you have not eaten for a while (fasting). You may have this done every 1-3 years.  Abdominal aortic aneurysm (AAA) screening. You may need this if you are a current or former smoker.  Osteoporosis. You may be screened starting at age 8 if you are at high risk. Talk with your health care provider about your test results, treatment options, and if necessary, the need for more tests.  Vaccines  Your health care provider may recommend certain vaccines, such as:  Influenza vaccine. This is recommended every year.  Tetanus, diphtheria, and acellular pertussis (Tdap, Td) vaccine. You may need a Td booster every 10 years.  Zoster vaccine. You may need this after age 38.   Pneumococcal 13-valent conjugate (PCV13) vaccine. One dose is recommended after age 16.  Pneumococcal polysaccharide (PPSV23) vaccine. One dose is recommended after age 78. Talk to your health care provider about which screenings and vaccines you need and how often you need them. This information is not intended to replace advice given to you by your health care provider. Make sure you discuss any questions you have with your health care provider. Document Released: 05/01/2015 Document Revised: 12/23/2015 Document Reviewed: 02/03/2015 Elsevier Interactive Patient Education  2017 [place] Prevention in the Home Falls can cause injuries. They can happen to people of all ages. There are many things you can do to make your home safe and to help prevent falls. What can I do on the outside of my home?  Regularly fix the edges of walkways and driveways and fix any cracks.  Remove anything that might make you trip as you walk through a door, such as a raised step or threshold.  Trim any bushes or trees on the path to your home.  Use bright outdoor lighting.  Clear any walking paths of anything that might make someone trip, such as rocks or tools.  Regularly check to see if handrails are loose or broken. Make sure that both sides of any steps have handrails.  Any raised decks and porches should have guardrails on the edges.  Have any leaves, snow, or ice cleared regularly.  Use sand or salt on walking paths during winter.  Clean up any spills in your garage right away. This includes oil or grease spills. What can I do in the bathroom?  Use night lights.  Install grab bars by the toilet and in the tub and shower. Do not use towel bars as grab bars.  Use non-skid mats or decals in the tub or shower.  If you need to sit down in the shower, use a plastic, non-slip stool.  Keep the floor dry. Clean up any water that spills on the floor as soon as it happens.  Remove soap  buildup in the tub or shower regularly.  Attach bath mats securely with double-sided non-slip rug tape.  Do not have throw rugs and other things on the floor that can make you trip. What can I do in the bedroom?  Use night lights.  Make sure that you have a light by your bed that is easy to reach.  Do not use any sheets or blankets that are too big for your bed. They should not hang down onto the floor.  Have a firm chair that has side arms. You can use this for support while you get dressed.  Do not have throw rugs and other things on the floor that can make you trip. What can I do in the kitchen?  Clean up any spills right away.  Avoid walking on wet floors.  Keep items that you use a lot in easy-to-reach places.  If you need to reach something above you, use a strong step stool that has a grab bar.  Keep electrical cords out of the way.  Do not use floor polish or wax that makes floors slippery. If you must use wax, use non-skid floor wax.  Do not have throw rugs and other things on the floor that can make you trip. What can I do with my stairs?  Do not leave any items on the stairs.  Make sure that there are handrails on both sides of the stairs and use them. Fix handrails that are broken or loose. Make sure that handrails are as long as the stairways.  Check any carpeting to make sure that it is firmly attached to the stairs. Fix any carpet that is loose or worn.  Avoid having throw rugs at the top or bottom of the stairs. If you do have throw rugs, attach them to the floor with carpet tape.  Make sure that you have a light switch at the top of the stairs and the bottom of the stairs. If you do not have them, ask someone to add them for you. What else can I do to help prevent falls?  Wear shoes that:  Do not have high heels.  Have rubber bottoms.  Are comfortable and fit you well.  Are closed at the toe. Do not wear sandals.  If you use a stepladder:  Make  sure that it is fully opened. Do not climb a closed stepladder.  Make sure that both sides of the stepladder are locked into place.  Ask someone to hold it for you, if possible.  Clearly mark and make sure that you can see:  Any grab bars or handrails.  First and last steps.  Where the edge of each step is.  Use tools that help you move around (mobility aids) if they are needed. These include:  Canes.  Walkers.  Scooters.  Crutches.  Turn on the lights when you go into a dark area. Replace any light bulbs as soon as they burn out.  Set up your furniture so you have a clear path. Avoid moving your furniture around.  If any of your floors are uneven, fix them.  If there are any pets around you, be aware of where they are.  Review your medicines with your doctor. Some medicines can make you feel dizzy. This can increase your chance of falling. Ask your doctor what other things that you can do to help prevent falls. This information is not intended to replace advice given to you by your health care provider. Make sure you discuss any questions you have with your health care provider. Document Released: 01/29/2009 Document Revised: 09/10/2015 Document Reviewed: 05/09/2014 Elsevier Interactive Patient Education  2017 [employer].

## 2019-01-21 NOTE — Progress Notes (Signed)
Subjective:   Shane FreestoneKenneth F Duncan is a 68 y.o. male who presents for Medicare Annual/Subsequent preventive examination.  This visit is being conducted via phone call  - after an attmept to do on video chat - due to the COVID-19 pandemic. This patient has given me verbal consent via phone to conduct this visit, patient states they are participating from their home address. Some vital signs may be absent or patient reported.   Patient identification: identified by name, DOB, and current address.    Review of Systems:  Cardiac Risk Factors include: advanced age (>755men, 71>65 women);hypertension;male gender     Objective:    Vitals: BP 106/81 Comment: pt reported  Wt 210 lb (95.3 kg) Comment: pt reported  BMI 29.29 kg/m   Body mass index is 29.29 kg/m.  Advanced Directives 01/21/2019 06/11/2018 02/02/2018  Does Patient Have a Medical Advance Directive? Yes Yes Yes  Type of Advance Directive Living will;Healthcare Power of Attorney Living will;Healthcare Power of [employer] Corporationttorney Healthcare Power of WintersAttorney;Living will  Does patient want to make changes to medical advance directive? - No - Patient declined -  Copy of Healthcare Power of Attorney in Chart? No - copy requested No - copy requested No - copy requested    Tobacco Social History   Tobacco Use  Smoking Status Never Smoker  Smokeless Tobacco Never Used     Counseling given: Not Answered   Clinical Intake:  Pre-visit preparation completed: Yes  Pain : 0-10 Pain Score: 7  Pain Type: Chronic pain Pain Location: Generalized Pain Descriptors / Indicators: Aching, Discomfort, Sharp Pain Onset: More than a month ago Pain Frequency: Constant Pain Relieving Factors: aleve  Pain Relieving Factors: aleve  Nutritional Status: BMI 25 -29 Overweight Nutritional Risks: None Diabetes: No  How often do you need to have someone help you when you read instructions, pamphlets, or other written materials from your doctor or pharmacy?: 1  - Never   Interpreter Needed?: No  Information entered by :: Kalayah Leske,LPN  Past Medical History:  Diagnosis Date  . Bronchitis   . Chronic sinusitis   . Esophagitis   . Hemorrhoids   . Hepatitis A   . Hyperlipidemia   . Hypertension   . IFG (impaired fasting glucose)   . Temporal arteritis (HCC) 01/2018   Past Surgical History:  Procedure Laterality Date  . ACHILLES TENDON SURGERY Right 1997  . ANUS SURGERY  1990's   anal fistula repair  . ARTERY BIOPSY Left 02/05/2018   Procedure: BIOPSY TEMPORAL ARTERY;  Surgeon: Annice Needyew, Jason S, MD;  Location: [facility] ORS;  Service: Vascular;  Laterality: Left;  . TONSILLECTOMY  1961   Family History  Problem Relation Age of Onset  . Cancer Father        Colon  . Hypertension Father   . Hyperlipidemia Father   . Ulcerative colitis Son    Social History   Socioeconomic History  . Marital status: Married    Spouse name: Not on file  . Number of children: Not on file  . Years of education: Not on file  . Highest education level: Associate degree: academic program  Occupational History  . Occupation: retired   Engineer, productionocial Needs  . Financial resource strain: Not hard at all  . Food insecurity    Worry: Never true    Inability: Never true  . Transportation needs    Medical: No    Non-medical: No  Tobacco Use  . Smoking status: Never Smoker  . Smokeless  tobacco: Never Used  Substance and Sexual Activity  . Alcohol use: Yes    Comment: occassional   . Drug use: Yes    Types: Marijuana    Comment: daily   . Sexual activity: Yes  Lifestyle  . Physical activity    Days per week: 0 days    Minutes per session: 0 min  . Stress: Not at all  Relationships  . Social connections    Talks on phone: More than three times a week    Gets together: More than three times a week    Attends religious service: Never    Active member of club or organization: Yes    Attends meetings of clubs or organizations: More than 4 times per year     Relationship status: Married  Other Topics Concern  . Not on file  Social History Narrative  . Not on file    Outpatient Encounter Medications as of 01/21/2019  Medication Sig  . Cholecalciferol (VITAMIN D3) 50 MCG (2000 UT) capsule Take by mouth.  . folic acid (FOLVITE) 1 MG tablet Take 1 mg by mouth daily.  . methotrexate (RHEUMATREX) 2.5 MG tablet TAKE 6  TABS THE SAME DAY, ONCE A WEEK,  4 weeks  . Multiple Vitamin (MULTIVITAMIN) capsule Take 1 capsule by mouth daily.  . naproxen sodium (ALEVE) 220 MG tablet Take by mouth.  . nortriptyline (PAMELOR) 25 MG capsule Take 1 capsule (25 mg total) by mouth at bedtime.  . Omega-3 Fatty Acids (FISH OIL) 1000 MG CAPS Take by mouth.  . tamsulosin (FLOMAX) 0.4 MG CAPS capsule Take 1 capsule (0.4 mg total) by mouth daily.  . traMADol (ULTRAM) 50 MG tablet Take 50 mg by mouth every 6 (six) hours as needed.  Marland Kitchen acetaminophen (TYLENOL) 500 MG tablet Take 500 mg by mouth every 6 (six) hours as needed.   No facility-administered encounter medications on file as of 01/21/2019.     Activities of Daily Living In your present state of health, do you have any difficulty performing the following activities: 01/21/2019 02/02/2018  Hearing? N N  Comment no hearing aids -  Vision? Y N  Comment eyeglasses, dr.woodard -  Difficulty concentrating or making decisions? N N  Comment typically comes back -  Walking or climbing stairs? Y N  Dressing or bathing? Y N  Comment due to pain -  Doing errands, shopping? N N  Preparing Food and eating ? N -  Using the Toilet? N -  In the past six months, have you accidently leaked urine? N -  Do you have problems with loss of bowel control? N -  Managing your Medications? N -  Managing your Finances? N -  Housekeeping or managing your Housekeeping? N -  Some recent data might be hidden    Patient Care Team: Dorcas Carrow, DO as PCP - General (Family Medicine)   Assessment:   This is a routine wellness  examination for Shane Duncan.  Exercise Activities and Dietary recommendations Current Exercise Habits: The patient does not participate in regular exercise at present, Intensity: Mild, Exercise limited by: None identified  Goals   None     Fall Risk Fall Risk  01/21/2019 01/03/2018  Falls in the past year? 0 No  Number falls in past yr: 0 -  Injury with Fall? 0 -   FALL RISK PREVENTION PERTAINING TO THE HOME:  Any stairs in or around the home? Yes  If so, are there any without handrails? No  Home free of loose throw rugs in walkways, pet beds, electrical cords, etc? Yes  Adequate lighting in your home to reduce risk of falls? Yes   ASSISTIVE DEVICES UTILIZED TO PREVENT FALLS:  Life alert? No  Use of a cane, walker or w/c? No  Grab bars in the bathroom? No  Shower chair or bench in shower? No  Elevated toilet seat or a handicapped toilet? No    TIMED UP AND GO:  Unable to perform   Depression Screen PHQ 2/9 Scores 01/21/2019 01/15/2019 01/03/2018  PHQ - 2 Score 0 0 0    Cognitive Function        Immunization History  Administered Date(s) Administered  . Fluad Quad(high Dose 65+) 01/16/2019  . Pneumococcal Conjugate-13 01/16/2019  . Tdap 12/30/2010    Qualifies for Shingles Vaccine? Yes  Zostavax completed n/a. Due for Shingrix. Education has been provided regarding the importance of this vaccine. Pt has been advised to call insurance company to determine out of pocket expense. Advised may also receive vaccine at local pharmacy or Health Dept. Verbalized acceptance and understanding.   Tdap: up to date   Flu Vaccine: up to date   Pneumococcal Vaccine: up to date   Screening Tests Health Maintenance  Topic Date Due  . Hepatitis C Screening  October 04, 1950  . URINE MICROALBUMIN  01/17/2017  . COLONOSCOPY  01/10/2019  . PNA vac Low Risk Adult (2 of 2 - PPSV23) 01/16/2020  . TETANUS/TDAP  12/29/2020  . INFLUENZA VACCINE  Completed   Cancer Screenings:   Colorectal Screening: Completed 01/09/2014 . Repeat every 5-10 years   Lung Cancer Screening: (Low Dose CT Chest recommended if Age 45-80 years, 30 pack-year currently smoking OR have quit w/in 15years.) does not qualify.    Additional Screening:  Hepatitis C Screening: does qualify; ordered   Dental Screening: Recommended annual dental exams for proper oral hygiene  Community Resource Referral:  CRR required this visit?  No        Plan:  I have personally reviewed and addressed the Medicare Annual Wellness questionnaire and have noted the following in the patient's chart:  A. Medical and social history B. Use of alcohol, tobacco or illicit drugs  C. Current medications and supplements D. Functional ability and status E.  Nutritional status F.  Physical activity G. Advance directives H. List of other physicians I.  Hospitalizations, surgeries, and ER visits in previous 12 months J.  Prowers such as hearing and vision if needed, cognitive and depression L. Referrals and appointments   In addition, I have reviewed and discussed with patient certain preventive protocols, quality metrics, and best practice recommendations. A written personalized care plan for preventive services as well as general preventive health recommendations were provided to patient.   Signed,   Bevelyn Ngo, LPN  56/12/7946 Nurse Health Advisor  Nurse Notes: none

## 2019-01-24 ENCOUNTER — Other Ambulatory Visit: Payer: Self-pay

## 2019-01-24 ENCOUNTER — Encounter: Payer: Self-pay | Admitting: Family Medicine

## 2019-01-24 ENCOUNTER — Ambulatory Visit (INDEPENDENT_AMBULATORY_CARE_PROVIDER_SITE_OTHER): Payer: Medicare Other | Admitting: Family Medicine

## 2019-01-24 DIAGNOSIS — M1380 Other specified arthritis, unspecified site: Secondary | ICD-10-CM

## 2019-01-24 MED ORDER — TRAMADOL HCL 50 MG PO TABS: 50.0000 mg | ORAL_TABLET | Freq: Two times a day (BID) | ORAL | 0 refills | Status: DC

## 2019-01-24 NOTE — Progress Notes (Signed)
BP 122/81   Pulse 99   Temp 98.5 F (36.9 C) (Oral)   Ht 5\' 11"  (1.803 m)   Wt 211 lb (95.7 kg)   SpO2 98%   BMI 29.43 kg/m    Subjective:    Patient ID: Shane Duncan, male    DOB: Jun 09, 1950, 68 y.o.   MRN: [id number]  HPI: Shane Duncan is a 68 y.o. male  Chief Complaint  Patient presents with  . Pain    f/u   Tramadol is helping- taking it about 1x a day. He has been waiting to get into [facility] for a 2nd opinion with their arthritis team. He notes that his hands seem to be getting worse. He now cannot make a fist at all. Continues with a lot of pain in his hands as well as wide spread body aches. He does not feel like the methotrexate is helping. Not due to see his rheumatologist again for another 2 months. He is otherwise feeling OK. He does feel like the tramadol has helped him to be able to sleep. He would like to continue it. No other concerns or complaints at this time.   Relevant past medical, surgical, family and social history reviewed and updated as indicated. Interim medical history since our last visit reviewed. Allergies and medications reviewed and updated.  Review of Systems  Constitutional: Negative.   Respiratory: Negative.   Cardiovascular: Negative.   Musculoskeletal: Positive for arthralgias, joint swelling and myalgias. Negative for back pain, gait problem, neck pain and neck stiffness.  Skin: Negative.   Neurological: Negative.   Psychiatric/Behavioral: Negative.     Per HPI unless specifically indicated above     Objective:    BP 122/81   Pulse 99   Temp 98.5 F (36.9 C) (Oral)   Ht 5\' 11"  (1.803 m)   Wt 211 lb (95.7 kg)   SpO2 98%   BMI 29.43 kg/m   Wt Readings from Last 3 Encounters:  01/24/19 211 lb (95.7 kg)  01/21/19 210 lb (95.3 kg)  06/22/18 206 lb (93.4 kg)    Physical Exam Vitals signs and nursing note reviewed.  Constitutional:      General: He is not in acute distress.    Appearance: Normal appearance. He is not  ill-appearing, toxic-appearing or diaphoretic.  HENT:     Head: Normocephalic and atraumatic.     Right Ear: External ear normal.     Left Ear: External ear normal.     Nose: Nose normal.     Mouth/Throat:     Mouth: Mucous membranes are moist.     Pharynx: Oropharynx is clear.  Eyes:     General: No scleral icterus.       Right eye: No discharge.        Left eye: No discharge.     Extraocular Movements: Extraocular movements intact.     Conjunctiva/sclera: Conjunctivae normal.     Pupils: Pupils are equal, round, and reactive to light.  Neck:     Musculoskeletal: Normal range of motion and neck supple.  Cardiovascular:     Rate and Rhythm: Normal rate and regular rhythm.     Pulses: Normal pulses.     Heart sounds: Normal heart sounds. No murmur. No friction rub. No gallop.   Pulmonary:     Effort: Pulmonary effort is normal. No respiratory distress.     Breath sounds: Normal breath sounds. No stridor. No wheezing, rhonchi or rales.  Chest:  Chest wall: No tenderness.  Musculoskeletal:        General: Swelling, tenderness and deformity present.  Skin:    General: Skin is warm and dry.     Capillary Refill: Capillary refill takes less than 2 seconds.     Coloration: Skin is not jaundiced or pale.     Findings: No bruising, erythema, lesion or rash.  Neurological:     General: No focal deficit present.     Mental Status: He is alert and oriented to person, place, and time. Mental status is at baseline.  Psychiatric:        Mood and Affect: Mood normal.        Behavior: Behavior normal.        Thought Content: Thought content normal.        Judgment: Judgment normal.     Results for orders placed or performed in visit on 09/25/18  SAR CoV2 Serology (COVID 19)AB(IGG)IA  Result Value Ref Range   SARS-CoV-2 Ab, IgG Negative Negative  Bayer DCA Hb A1c Waived  Result Value Ref Range   HB A1C (BAYER DCA - WAIVED) 5.7 <7.0 %  CBC with Differential/Platelet  Result Value  Ref Range   WBC 11.0 (H) 3.4 - 10.8 x10E3/uL   RBC 5.11 4.14 - 5.80 x10E6/uL   Hemoglobin 13.6 13.0 - 17.7 g/dL   Hematocrit 75.1 02.5 - 51.0 %   MCV 81 79 - 97 fL   MCH 26.6 26.6 - 33.0 pg   MCHC 32.7 31.5 - 35.7 g/dL   RDW 85.2 77.8 - 24.2 %   Platelets 307 150 - 450 x10E3/uL   Neutrophils 76 Not Estab. %   Lymphs 10 Not Estab. %   Monocytes 7 Not Estab. %   Eos 5 Not Estab. %   Basos 1 Not Estab. %   Neutrophils Absolute 8.5 (H) 1.4 - 7.0 x10E3/uL   Lymphocytes Absolute 1.1 0.7 - 3.1 x10E3/uL   Monocytes Absolute 0.7 0.1 - 0.9 x10E3/uL   EOS (ABSOLUTE) 0.5 (H) 0.0 - 0.4 x10E3/uL   Basophils Absolute 0.1 0.0 - 0.2 x10E3/uL   Immature Granulocytes 1 Not Estab. %   Immature Grans (Abs) 0.1 0.0 - 0.1 x10E3/uL  Iron and TIBC  Result Value Ref Range   Total Iron Binding Capacity 249 (L) 250 - 450 ug/dL   UIBC 353 614 - 431 ug/dL   Iron 48 38 - 540 ug/dL   Iron Saturation 19 15 - 55 %  Ferritin  Result Value Ref Range   Ferritin 238 30 - 400 ng/mL      Assessment & Plan:   Problem List Items Addressed This Visit      Musculoskeletal and Integument   Seronegative arthritis    Waiting on 2nd opinion with [facility]- we will check on that referral. Will continue PRN tramadol for now until his arthritis is under better control. Continue to monitor closely. Call with any concerns.       Relevant Medications   traMADol (ULTRAM) 50 MG tablet       Follow up plan: Return in about 4 weeks (around 02/21/2019).

## 2019-01-26 ENCOUNTER — Encounter: Payer: Self-pay | Admitting: Family Medicine

## 2019-01-26 NOTE — Assessment & Plan Note (Signed)
Waiting on 2nd opinion with [facility]- we will check on that referral. Will continue PRN tramadol for now until his arthritis is under better control. Continue to monitor closely. Call with any concerns.

## 2019-01-31 DIAGNOSIS — M1380 Other specified arthritis, unspecified site: Secondary | ICD-10-CM | POA: Diagnosis not present

## 2019-01-31 DIAGNOSIS — Z79899 Other long term (current) drug therapy: Secondary | ICD-10-CM | POA: Diagnosis not present

## 2019-02-14 DIAGNOSIS — Z79899 Other long term (current) drug therapy: Secondary | ICD-10-CM | POA: Diagnosis not present

## 2019-02-14 DIAGNOSIS — M1380 Other specified arthritis, unspecified site: Secondary | ICD-10-CM | POA: Diagnosis not present

## 2019-02-26 ENCOUNTER — Ambulatory Visit (INDEPENDENT_AMBULATORY_CARE_PROVIDER_SITE_OTHER): Payer: Medicare Other | Admitting: Family Medicine

## 2019-02-26 ENCOUNTER — Other Ambulatory Visit: Payer: Self-pay

## 2019-02-26 ENCOUNTER — Encounter: Payer: Self-pay | Admitting: Family Medicine

## 2019-02-26 DIAGNOSIS — M1380 Other specified arthritis, unspecified site: Secondary | ICD-10-CM | POA: Diagnosis not present

## 2019-02-26 MED ORDER — TRAMADOL HCL 50 MG PO TABS: 50.0000 mg | ORAL_TABLET | Freq: Two times a day (BID) | ORAL | 2 refills | Status: DC

## 2019-02-26 NOTE — Progress Notes (Signed)
There were no vitals taken for this visit.   Subjective:    Patient ID: Shane Duncan, male    DOB: 14-Jul-1950, 68 y.o.   MRN: [id number]  HPI: Shane Duncan is a 68 y.o. male  Chief Complaint  Patient presents with  . Follow-up    chronic pain    Saw rheumatology again at the end of October. At that time he was restarted on prednisone and had his methotrexate dose increased. He is due to follow up with them in December. He also has an appointment with [facility] rheumatology for a 2nd opinion in February. He notes that since going back on the prednisone, his pain has gotten a bit better, but he does need his tramadol as it seems to be helping. He is feeling more comfortable. He is otherwise doing well with no other concerns or complaints at this time.   Relevant past medical, surgical, family and social history reviewed and updated as indicated. Interim medical history since our last visit reviewed. Allergies and medications reviewed and updated.  Review of Systems  Constitutional: Negative.   Respiratory: Negative.   Cardiovascular: Negative.   Musculoskeletal: Positive for arthralgias, joint swelling and myalgias. Negative for back pain, gait problem, neck pain and neck stiffness.  Skin: Negative.   Psychiatric/Behavioral: Negative.     Per HPI unless specifically indicated above     Objective:    There were no vitals taken for this visit.  Wt Readings from Last 3 Encounters:  01/24/19 211 lb (95.7 kg)  01/21/19 210 lb (95.3 kg)  06/22/18 206 lb (93.4 kg)    Physical Exam Vitals signs and nursing note reviewed.  Constitutional:      General: He is not in acute distress.    Appearance: Normal appearance. He is not ill-appearing, toxic-appearing or diaphoretic.  HENT:     Head: Normocephalic and atraumatic.     Right Ear: External ear normal.     Left Ear: External ear normal.     Nose: Nose normal.     Mouth/Throat:     Mouth: Mucous membranes are moist.   Pharynx: Oropharynx is clear.  Eyes:     General: No scleral icterus.       Right eye: No discharge.        Left eye: No discharge.     Conjunctiva/sclera: Conjunctivae normal.     Pupils: Pupils are equal, round, and reactive to light.  Neck:     Musculoskeletal: Normal range of motion.  Pulmonary:     Effort: Pulmonary effort is normal. No respiratory distress.     Comments: Speaking in full sentences Musculoskeletal: Normal range of motion.  Skin:    Coloration: Skin is not jaundiced or pale.     Findings: No bruising, erythema, lesion or rash.  Neurological:     Mental Status: He is alert and oriented to person, place, and time. Mental status is at baseline.  Psychiatric:        Mood and Affect: Mood normal.        Behavior: Behavior normal.        Thought Content: Thought content normal.        Judgment: Judgment normal.     Results for orders placed or performed in visit on 09/25/18  SAR CoV2 Serology (COVID 19)AB(IGG)IA  Result Value Ref Range   SARS-CoV-2 Ab, IgG Negative Negative  Bayer DCA Hb A1c Waived  Result Value Ref Range   HB A1C (BAYER DCA -  WAIVED) 5.7 <7.0 %  CBC with Differential/Platelet  Result Value Ref Range   WBC 11.0 (H) 3.4 - 10.8 x10E3/uL   RBC 5.11 4.14 - 5.80 x10E6/uL   Hemoglobin 13.6 13.0 - 17.7 g/dL   Hematocrit 57.0 17.7 - 51.0 %   MCV 81 79 - 97 fL   MCH 26.6 26.6 - 33.0 pg   MCHC 32.7 31.5 - 35.7 g/dL   RDW 93.9 03.0 - 09.2 %   Platelets 307 150 - 450 x10E3/uL   Neutrophils 76 Not Estab. %   Lymphs 10 Not Estab. %   Monocytes 7 Not Estab. %   Eos 5 Not Estab. %   Basos 1 Not Estab. %   Neutrophils Absolute 8.5 (H) 1.4 - 7.0 x10E3/uL   Lymphocytes Absolute 1.1 0.7 - 3.1 x10E3/uL   Monocytes Absolute 0.7 0.1 - 0.9 x10E3/uL   EOS (ABSOLUTE) 0.5 (H) 0.0 - 0.4 x10E3/uL   Basophils Absolute 0.1 0.0 - 0.2 x10E3/uL   Immature Granulocytes 1 Not Estab. %   Immature Grans (Abs) 0.1 0.0 - 0.1 x10E3/uL  Iron and TIBC  Result Value Ref  Range   Total Iron Binding Capacity 249 (L) 250 - 450 ug/dL   UIBC 330 076 - 226 ug/dL   Iron 48 38 - 333 ug/dL   Iron Saturation 19 15 - 55 %  Ferritin  Result Value Ref Range   Ferritin 238 30 - 400 ng/mL      Assessment & Plan:   Problem List Items Addressed This Visit      Musculoskeletal and Integument   Seronegative arthritis    Continues to work with rheumatology and have his medication adjusted. We will continue tramadol for now with goal that he will get stable on his medication and not need it. Continue to monitor. Call with any concerns.       Relevant Medications   predniSONE (DELTASONE) 10 MG tablet   traMADol (ULTRAM) 50 MG tablet       Follow up plan: Return in about 3 months (around 05/29/2019).    . This visit was completed via D[employer] due to the restrictions of the COVID-19 pandemic. All issues as above were discussed and addressed. Physical exam was done as above through visual confirmation on doximity. If it was felt that the patient should be evaluated in the office, they were directed there. The patient verbally consented to this visit. . Location of the patient: home . Location of the provider: home . Those involved with this call:  . Provider: Olevia Perches, DO . CMA: Tiffany Reel, CMA . Front Desk/Registration: Adela Ports  . Time spent on call: 15 minutes with patient face to face via video conference. More than 50% of this time was spent in counseling and coordination of care. 23 minutes total spent in review of patient's record and preparation of their chart.

## 2019-02-26 NOTE — Assessment & Plan Note (Signed)
Continues to work with rheumatology and have his medication adjusted. We will continue tramadol for now with goal that he will get stable on his medication and not need it. Continue to monitor. Call with any concerns.

## 2019-03-18 DIAGNOSIS — M0600 Rheumatoid arthritis without rheumatoid factor, unspecified site: Secondary | ICD-10-CM | POA: Diagnosis not present

## 2019-03-28 ENCOUNTER — Ambulatory Visit (INDEPENDENT_AMBULATORY_CARE_PROVIDER_SITE_OTHER): Payer: Medicare Other | Admitting: Family Medicine

## 2019-03-28 ENCOUNTER — Encounter: Payer: Self-pay | Admitting: Family Medicine

## 2019-03-28 ENCOUNTER — Other Ambulatory Visit: Payer: Self-pay

## 2019-03-28 VITALS — BP 142/84 | HR 83 | Temp 98.5°F | Ht 70.47 in | Wt 202.2 lb

## 2019-03-28 DIAGNOSIS — E785 Hyperlipidemia, unspecified: Secondary | ICD-10-CM | POA: Diagnosis not present

## 2019-03-28 DIAGNOSIS — Z1159 Encounter for screening for other viral diseases: Secondary | ICD-10-CM | POA: Diagnosis not present

## 2019-03-28 DIAGNOSIS — N401 Enlarged prostate with lower urinary tract symptoms: Secondary | ICD-10-CM

## 2019-03-28 DIAGNOSIS — R351 Nocturia: Secondary | ICD-10-CM

## 2019-03-28 DIAGNOSIS — E119 Type 2 diabetes mellitus without complications: Secondary | ICD-10-CM

## 2019-03-28 DIAGNOSIS — M316 Other giant cell arteritis: Secondary | ICD-10-CM

## 2019-03-28 DIAGNOSIS — I10 Essential (primary) hypertension: Secondary | ICD-10-CM | POA: Diagnosis not present

## 2019-03-28 LAB — UA/M W/RFLX CULTURE, ROUTINE
Bilirubin, UA: NEGATIVE
Glucose, UA: NEGATIVE
Ketones, UA: NEGATIVE
Leukocytes,UA: NEGATIVE
Nitrite, UA: NEGATIVE
Protein,UA: NEGATIVE
RBC, UA: NEGATIVE
Specific Gravity, UA: 1.025 (ref 1.005–1.030)
Urobilinogen, Ur: 0.2 mg/dL (ref 0.2–1.0)
pH, UA: 5.5 (ref 5.0–7.5)

## 2019-03-28 LAB — MICROALBUMIN, URINE WAIVED
Creatinine, Urine Waived: 300 mg/dL (ref 10–300)
Microalb, Ur Waived: 10 mg/L (ref 0–19)
Microalb/Creat Ratio: <30 mg/g (ref <30)

## 2019-03-28 LAB — BAYER DCA HB A1C WAIVED: HB A1C (BAYER DCA - WAIVED): 5.6 % (ref <7.0)

## 2019-03-28 MED ORDER — TAMSULOSIN HCL 0.4 MG PO CAPS: 0.4000 mg | ORAL_CAPSULE | Freq: Every day | ORAL | 1 refills | Status: DC

## 2019-03-28 NOTE — Assessment & Plan Note (Signed)
Under good control on diet and exercise. Continue to monitor. Call with any concerns. Labs drawn today.  

## 2019-03-28 NOTE — Assessment & Plan Note (Signed)
Seems to be acting up again- seeing neurology tomorrow. Checking ESR today.

## 2019-03-28 NOTE — Assessment & Plan Note (Addendum)
Under good control on diet and exercise. Continue to monitor. Call with any concerns. Labs drawn today.

## 2019-03-28 NOTE — Assessment & Plan Note (Signed)
A1c 5.6- only 1 elevated sugar while on long term prednisone. If still staying low in 6 months, will resolve DM.

## 2019-03-28 NOTE — Progress Notes (Signed)
BP (!) 142/84   Pulse 83   Temp 98.5 F (36.9 C)   Ht 5' 10.47" (1.79 m)   Wt 202 lb 4 oz (91.7 kg)   SpO2 96%   BMI 28.63 kg/m    Subjective:    Patient ID: Shane Duncan, male    DOB: 1950/10/08, 68 y.o.   MRN: [id number]  HPI: Shane Duncan is a 68 y.o. male  Chief Complaint  Patient presents with  . Annual Exam   DIABETES Hypoglycemic episodes:no Polydipsia/polyuria: no Visual disturbance: no Chest pain: no Paresthesias: no Glucose Monitoring: no  Accucheck frequency: Not Checking Taking Insulin?: no Blood Pressure Monitoring: a few times a week Retinal Examination: Not up to Date Foot Exam: Up to Date Diabetic Education: Not Completed Pneumovax: Up to Date Influenza: Up to Date Aspirin: no  HYPERTENSION / Rolfe Satisfied with current treatment? yes Duration of hypertension: chronic BP monitoring frequency: not checking BP medication side effects: no Past BP meds: none Duration of hyperlipidemia: chronic Cholesterol medication side effects: not on anything Cholesterol supplements: none Past cholesterol medications: none Medication compliance: N/A Aspirin: no Recent stressors: no Recurrent headaches: no Visual changes: no Palpitations: no Dyspnea: no Chest pain: no Lower extremity edema: no Dizzy/lightheaded: no   Relevant past medical, surgical, family and social history reviewed and updated as indicated. Interim medical history since our last visit reviewed. Allergies and medications reviewed and updated.  Review of Systems  Constitutional: Negative.   HENT: Negative.   Respiratory: Negative.   Cardiovascular: Positive for leg swelling. Negative for chest pain and palpitations.  Gastrointestinal: Negative.   Endocrine: Negative.   Genitourinary: Negative.   Musculoskeletal: Positive for arthralgias, back pain, joint swelling and myalgias. Negative for gait problem, neck pain and neck stiffness.  Allergic/Immunologic:  Negative.   Neurological: Negative.   Hematological: Negative.   Psychiatric/Behavioral: Negative.     Per HPI unless specifically indicated above     Objective:    BP (!) 142/84   Pulse 83   Temp 98.5 F (36.9 C)   Ht 5' 10.47" (1.79 m)   Wt 202 lb 4 oz (91.7 kg)   SpO2 96%   BMI 28.63 kg/m   Wt Readings from Last 3 Encounters:  03/28/19 202 lb 4 oz (91.7 kg)  01/24/19 211 lb (95.7 kg)  01/21/19 210 lb (95.3 kg)    Physical Exam Vitals and nursing note reviewed.  Constitutional:      General: He is not in acute distress.    Appearance: Normal appearance. He is not ill-appearing, toxic-appearing or diaphoretic.  HENT:     Head: Normocephalic and atraumatic.     Right Ear: Tympanic membrane, ear canal and external ear normal. There is no impacted cerumen.     Left Ear: Tympanic membrane, ear canal and external ear normal. There is no impacted cerumen.     Nose: Nose normal. No congestion or rhinorrhea.     Mouth/Throat:     Mouth: Mucous membranes are moist.     Pharynx: Oropharynx is clear. No oropharyngeal exudate or posterior oropharyngeal erythema.  Eyes:     General: No scleral icterus.       Right eye: No discharge.        Left eye: No discharge.     Extraocular Movements: Extraocular movements intact.     Conjunctiva/sclera: Conjunctivae normal.     Pupils: Pupils are equal, round, and reactive to light.  Neck:     Vascular: No carotid  bruit.  Cardiovascular:     Rate and Rhythm: Normal rate and regular rhythm.     Pulses: Normal pulses.     Heart sounds: No murmur. No friction rub. No gallop.   Pulmonary:     Effort: Pulmonary effort is normal. No respiratory distress.     Breath sounds: Normal breath sounds. No stridor. No wheezing, rhonchi or rales.  Chest:     Chest wall: No tenderness.  Abdominal:     General: Abdomen is flat. Bowel sounds are normal. There is no distension.     Palpations: Abdomen is soft. There is no mass.     Tenderness: There  is no abdominal tenderness. There is no right CVA tenderness, left CVA tenderness, guarding or rebound.     Hernia: No hernia is present.  Genitourinary:    Comments: Genital exam deferred with shared decision making Musculoskeletal:        General: No swelling, tenderness, deformity or signs of injury.     Cervical back: Normal range of motion and neck supple. No rigidity. No muscular tenderness.     Right lower leg: No edema.     Left lower leg: No edema.  Lymphadenopathy:     Cervical: No cervical adenopathy.  Skin:    General: Skin is warm and dry.     Capillary Refill: Capillary refill takes less than 2 seconds.     Coloration: Skin is not jaundiced or pale.     Findings: No bruising, erythema, lesion or rash.  Neurological:     General: No focal deficit present.     Mental Status: He is alert and oriented to person, place, and time.     Cranial Nerves: No cranial nerve deficit.     Sensory: No sensory deficit.     Motor: No weakness.     Coordination: Coordination normal.     Gait: Gait normal.     Deep Tendon Reflexes: Reflexes normal.  Psychiatric:        Mood and Affect: Mood normal.        Behavior: Behavior normal.        Thought Content: Thought content normal.        Judgment: Judgment normal.     Results for orders placed or performed in visit on 03/28/19  Bayer DCA Hb A1c Waived  Result Value Ref Range   HB A1C (BAYER DCA - WAIVED) 5.6 <7.0 %  UA/M w/rflx Culture, Routine   Specimen: Blood   BLD  Result Value Ref Range   Specific Gravity, UA CANCELED    pH, UA CANCELED    Protein,UA CANCELED    Glucose, UA CANCELED    Ketones, UA CANCELED   UA/M w/rflx Culture, Routine   Specimen: Urine   URINE  Result Value Ref Range   Specific Gravity, UA 1.025 1.005 - 1.030   pH, UA 5.5 5.0 - 7.5   Color, UA Yellow Yellow   Appearance Ur Clear Clear   Leukocytes,UA Negative Negative   Protein,UA Negative Negative/Trace   Glucose, UA Negative Negative    Ketones, UA Negative Negative   RBC, UA Negative Negative   Bilirubin, UA Negative Negative   Urobilinogen, Ur 0.2 0.2 - 1.0 mg/dL   Nitrite, UA Negative Negative  Microalbumin, Urine Waived  Result Value Ref Range   Microalb, Ur Waived 10 0 - 19 mg/L   Creatinine, Urine Waived 300 10 - 300 mg/dL   Microalb/Creat Ratio <30 <30 mg/g  Assessment & Plan:   Problem List Items Addressed This Visit      Cardiovascular and Mediastinum   Hypertension - Primary    Under good control on diet and exercise. Continue to monitor. Call with any concerns. Labs drawn today.       Relevant Orders   CBC with Differential OUT   Comp Met (CMET)   Microalbumin, Urine Waived   TSH   Microalbumin, Urine Waived (Completed)   Giant cell arteritis (HCC)    Seems to be acting up again- seeing neurology tomorrow. Checking ESR today.      Relevant Orders   CBC with Differential OUT   Comp Met (CMET)   Sed Rate (ESR)     Endocrine   Diet-controlled diabetes mellitus (HCC)    A1c 5.6- only 1 elevated sugar while on long term prednisone. If still staying low in 6 months, will resolve DM.       Relevant Orders   Bayer DCA Hb A1c Waived (Completed)   CBC with Differential OUT   Comp Met (CMET)   Microalbumin, Urine Waived     Genitourinary   BPH (benign prostatic hyperplasia)    Under good control on current regimen. Continue current regimen. Continue to monitor. Call with any concerns. Refills given. Labs drawn today.        Relevant Medications   tamsulosin (FLOMAX) 0.4 MG CAPS capsule   Other Relevant Orders   CBC with Differential OUT   Comp Met (CMET)   PSA   UA/M w/rflx Culture, Routine (Completed)   UA/M w/rflx Culture, Routine (Completed)     Other   Hyperlipidemia    Under good control on diet and exercise. Continue to monitor. Call with any concerns. Labs drawn today.       Relevant Orders   CBC with Differential OUT   Comp Met (CMET)   Lipid Panel w/o Chol/HDL  Ratio OUT    Other Visit Diagnoses    Encounter for hepatitis C screening test for low risk patient       Labs drawn today. Await results.    Relevant Orders   Hepatitis C Antibody       Follow up plan: Return As scheduled.

## 2019-03-28 NOTE — Assessment & Plan Note (Signed)
Under good control on current regimen. Continue current regimen. Continue to monitor. Call with any concerns. Refills given. Labs drawn today.   

## 2019-03-29 LAB — COMPREHENSIVE METABOLIC PANEL
ALT: 8 IU/L (ref 0–44)
AST: 18 IU/L (ref 0–40)
Albumin/Globulin Ratio: 1.6 (ref 1.2–2.2)
Albumin: 4.1 g/dL (ref 3.8–4.8)
Alkaline Phosphatase: 99 IU/L (ref 39–117)
BUN/Creatinine Ratio: 13 (ref 10–24)
BUN: 10 mg/dL (ref 8–27)
Bilirubin Total: 0.4 mg/dL (ref 0.0–1.2)
CO2: 19 mmol/L — ABNORMAL LOW (ref 20–29)
Calcium: 9.5 mg/dL (ref 8.6–10.2)
Chloride: 103 mmol/L (ref 96–106)
Creatinine, Ser: 0.78 mg/dL (ref 0.76–1.27)
GFR calc Af Amer: 109 mL/min/{1.73_m2} (ref >59)
GFR calc non Af Amer: 94 mL/min/{1.73_m2} (ref >59)
Globulin, Total: 2.5 g/dL (ref 1.5–4.5)
Glucose: 107 mg/dL — ABNORMAL HIGH (ref 65–99)
Potassium: 4.2 mmol/L (ref 3.5–5.2)
Sodium: 139 mmol/L (ref 134–144)
Total Protein: 6.6 g/dL (ref 6.0–8.5)

## 2019-03-29 LAB — CBC WITH DIFFERENTIAL/PLATELET
Basophils Absolute: 0 10*3/uL (ref 0.0–0.2)
Basos: 1 %
EOS (ABSOLUTE): 0.3 10*3/uL (ref 0.0–0.4)
Eos: 5 %
Hematocrit: 40.2 % (ref 37.5–51.0)
Hemoglobin: 13.1 g/dL (ref 13.0–17.7)
Immature Grans (Abs): 0 10*3/uL (ref 0.0–0.1)
Immature Granulocytes: 0 %
Lymphocytes Absolute: 1.2 10*3/uL (ref 0.7–3.1)
Lymphs: 21 %
MCH: 26.6 pg (ref 26.6–33.0)
MCHC: 32.6 g/dL (ref 31.5–35.7)
MCV: 82 fL (ref 79–97)
Monocytes Absolute: 0.6 10*3/uL (ref 0.1–0.9)
Monocytes: 10 %
Neutrophils Absolute: 3.7 10*3/uL (ref 1.4–7.0)
Neutrophils: 63 %
Platelets: 362 10*3/uL (ref 150–450)
RBC: 4.92 x10E6/uL (ref 4.14–5.80)
RDW: 14.5 % (ref 11.6–15.4)
WBC: 5.8 10*3/uL (ref 3.4–10.8)

## 2019-03-29 LAB — SEDIMENTATION RATE: Sed Rate: 47 mm/hr — ABNORMAL HIGH (ref 0–30)

## 2019-03-29 LAB — LIPID PANEL W/O CHOL/HDL RATIO
Cholesterol, Total: 192 mg/dL (ref 100–199)
HDL: 41 mg/dL (ref >39)
LDL Chol Calc (NIH): 130 mg/dL — ABNORMAL HIGH (ref 0–99)
Triglycerides: 114 mg/dL (ref 0–149)
VLDL Cholesterol Cal: 21 mg/dL (ref 5–40)

## 2019-03-29 LAB — HEPATITIS C ANTIBODY: Hep C Virus Ab: <0.1 s/co ratio (ref 0.0–0.9)

## 2019-03-29 LAB — TSH: TSH: 2.32 u[IU]/mL (ref 0.450–4.500)

## 2019-03-29 LAB — PSA: Prostate Specific Ag, Serum: 3 ng/mL (ref 0.0–4.0)

## 2019-04-01 DIAGNOSIS — M0600 Rheumatoid arthritis without rheumatoid factor, unspecified site: Secondary | ICD-10-CM | POA: Diagnosis not present

## 2019-04-10 ENCOUNTER — Telehealth: Payer: Self-pay | Admitting: Family Medicine

## 2019-04-10 DIAGNOSIS — M1380 Other specified arthritis, unspecified site: Secondary | ICD-10-CM

## 2019-04-10 NOTE — Telephone Encounter (Signed)
Pt said dr Wynetta Emery is aware he is having trouble with his left hand and would like OT to help him . Pt is having rheumatoid arthritis

## 2019-04-10 NOTE — Telephone Encounter (Signed)
Routing to provider  

## 2019-04-16 DIAGNOSIS — M1380 Other specified arthritis, unspecified site: Secondary | ICD-10-CM | POA: Diagnosis not present

## 2019-04-16 DIAGNOSIS — G8929 Other chronic pain: Secondary | ICD-10-CM | POA: Insufficient documentation

## 2019-04-23 ENCOUNTER — Encounter: Payer: Self-pay | Admitting: Occupational Therapy

## 2019-04-23 ENCOUNTER — Other Ambulatory Visit: Payer: Self-pay

## 2019-04-23 ENCOUNTER — Ambulatory Visit: Payer: Medicare Other | Attending: Family Medicine | Admitting: Occupational Therapy

## 2019-04-23 DIAGNOSIS — M25632 Stiffness of left wrist, not elsewhere classified: Secondary | ICD-10-CM

## 2019-04-23 DIAGNOSIS — M25641 Stiffness of right hand, not elsewhere classified: Secondary | ICD-10-CM

## 2019-04-23 DIAGNOSIS — M25642 Stiffness of left hand, not elsewhere classified: Secondary | ICD-10-CM | POA: Diagnosis not present

## 2019-04-23 DIAGNOSIS — M79642 Pain in left hand: Secondary | ICD-10-CM

## 2019-04-23 DIAGNOSIS — R600 Localized edema: Secondary | ICD-10-CM | POA: Diagnosis not present

## 2019-04-23 DIAGNOSIS — M79641 Pain in right hand: Secondary | ICD-10-CM | POA: Diagnosis not present

## 2019-04-23 DIAGNOSIS — M25631 Stiffness of right wrist, not elsewhere classified: Secondary | ICD-10-CM

## 2019-04-23 DIAGNOSIS — M6281 Muscle weakness (generalized): Secondary | ICD-10-CM | POA: Diagnosis not present

## 2019-04-23 NOTE — Therapy (Signed)
[facility] [address] [place], [address] Phone: [phone]   Fax:  [phone]  Occupational Therapy Evaluation  Patient Details  Name: Shane Duncan MRN: [id number] Date of Birth: [birth year] Referring Provider (OT): Park Liter   Encounter Date: 04/23/2019  OT End of Session - 04/23/19 1509    Visit Number  1    Number of Visits  16    Date for OT Re-Evaluation  06/18/19    OT Start Time  1130    OT Stop Time  1244    OT Time Calculation (min)  74 min    Activity Tolerance  Patient tolerated treatment well;Patient limited by pain    Behavior During Therapy  [facility] for tasks assessed/performed       Past Medical History:  Diagnosis Date  . Bronchitis   . Chronic sinusitis   . Esophagitis   . Hemorrhoids   . Hepatitis A   . Hyperlipidemia   . Hypertension   . IFG (impaired fasting glucose)   . Temporal arteritis ([place]) 01/2018    Past Surgical History:  Procedure Laterality Date  . ACHILLES TENDON SURGERY Right 1997  . ANUS SURGERY  1990's   anal fistula repair  . ARTERY BIOPSY Left 02/05/2018   Procedure: BIOPSY TEMPORAL ARTERY;  Surgeon: Algernon Huxley, MD;  Location: [facility] ORS;  Service: Vascular;  Laterality: Left;  . TONSILLECTOMY  1961    There were no vitals filed for this visit.  Subjective Assessment - 04/23/19 1453    Subjective   It s about 2 yrs it all started but since Aug 2020 - it has been horrible- woke up one morning and all my joints was hurting , swollen and  stiff - I have tried to work my neck, shoulders,, hips, knees and my R hand coming along - but L hand still swollen and stiff    Pertinent History  Per Dr Meda Coffee note -Seronegative arthritisNegative CCP negative inflammatory arthritis, multiple joints neck shoulder knees ankles and handS. Synovitis in both hands+ wrists unable to make a SEMI-fist. NO psoriasis. BETTER with methotrexate initiated in November 30, 2018,  REMICADE (3.2 MG/KG SINCE 03/18/2019) - had 2nd dosis Dec 4th and again next week - need help - L hand worse than R , wrist also stiff - L hand swollen - and then pain too with moving    Patient Stated Goals  I want to be able to make fist with both hands, to use my hands to play golf, fix things around the house, grip tools , work out , push up , wash hair, grip during bathing and dressing    Currently in Pain?  Yes    Pain Score  5     Pain Location  Hand    Pain Orientation  Right;Left    Pain Descriptors / Indicators  Aching;Tightness;Tender    Pain Type  Chronic pain    Pain Onset  More than a month ago    Pain Frequency  Constant    Aggravating Factors   worse when hitting fingers to bend them        [facility] OT Assessment - 04/23/19 0001      Assessment   Medical Diagnosis  Seronegative ccp RA - multiple joint pain ,swelling and stiffness     Referring Provider (OT)  Park Liter    Onset Date/Surgical Date  11/17/18    Hand Dominance  Right  Home  Environment   Lives With  Spouse      Prior Function   Vocation  Retired    Leisure  was very active in sports, golf, basketball, work out [employer], pushups , bike - and fix things around the house , yard work       AROM   Right Elbow Flexion  135    Left Elbow Flexion  120    Right Forearm Pronation  90 Degrees    Right Forearm Supination  80 Degrees    Left Forearm Pronation  90 Degrees    Left Forearm Supination  45 Degrees    Right Wrist Extension  40 Degrees    Right Wrist Flexion  64 Degrees    Right Wrist Radial Deviation  14 Degrees    Right Wrist Ulnar Deviation  20 Degrees    Left Wrist Extension  35 Degrees    Left Wrist Flexion  40 Degrees    Left Wrist Radial Deviation  17 Degrees    Left Wrist Ulnar Deviation  27 Degrees      Right Hand AROM   R Thumb Opposition to Index  --   lack 1 cm from 5th DIP    R Index  MCP 0-90  65 Degrees    R Index PIP 0-100  80 Degrees   -20   R Long  MCP 0-90  70  Degrees    R Long PIP 0-100  80 Degrees   -30   R Ring  MCP 0-90  70 Degrees    R Ring PIP 0-100  85 Degrees   -25   R Little  MCP 0-90  85 Degrees    R Little PIP 0-100  75 Degrees   -10     Left Hand AROM   L Thumb Opposition to Index  --   lack 1 cm from DIP of 5th    L Index  MCP 0-90  60 Degrees    L Index PIP 0-100  75 Degrees   -35   L Long  MCP 0-90  60 Degrees    L Long PIP 0-100  65 Degrees   -25   L Ring  MCP 0-90  55 Degrees    L Ring PIP 0-100  65 Degrees   -25   L Little  MCP 0-90  50 Degrees    L Little PIP 0-100  60 Degrees   -10      contrast done to L hand and paraffin to R hand prior to review of HEP  Pt ed on wife to do [facility] spreads, CT spreads and webspace  And interlocking for digits ABD  10 reps prior to HEP     Contrast on both hands - but can only do heat on L if increase ROM with that  PROM to bilateral DIP , PIP , gentle composite flexion  Tendon glides - block for [facility] and intrinsic fist  Composite flexion to 4.5  cm foam block on L and 3 cm R  8-10 reps  Pain less than 2-3/10   Cont with his compression copper gloves that he feels help  3 x day             OT Education - 04/23/19 1508    Education Details  findings of eval and HEP    Person(s) Educated  Patient    Methods  Explanation;Demonstration;Tactile cues;Verbal cues;Handout    Comprehension  Verbal cues required;Returned demonstration;Verbalized understanding  OT Short Term Goals - 04/23/19 1625      OT SHORT TERM GOAL #1   Title  Pt to be independent in HEP to increase digits flexion AROM to grasp 2 cm cylinder objects    Baseline  MC's R 65-85, PIP's 75-85 degrees , L MC 50-60 and PIP's 60-75 - and decrease PIP extentoin at all digits -10 to -35    Time  3    Period  Weeks    Status  New    Target Date  05/14/19      OT SHORT TERM GOAL #2   Title  Bilateral wrist AROM improve in all planes by 10 degrees to ease dressing  and turning doorknob    Baseline   see flowsheet    Time  4    Period  Weeks    Status  New    Target Date  05/21/19        OT Long Term Goals - 04/23/19 1637      OT LONG TERM GOAL #1   Title  Digits flexion improve for pt to touch palm to hold golf club, cut food    Baseline  MC's R 65-85, PIP's 75-85 degrees , L MC 50-60 and PIP's 60-75 - and decrease PIP extentoin at all digits -10 to -35    Time  6    Period  Weeks    Status  New    Target Date  06/04/19      OT LONG TERM GOAL #2   Title  Grip strength increase in bilateral hands to in range for his age to carry more than 10 lbs without increase symptoms , grip tools    Baseline  NT - decrease AROM - pain and edema in L hand worse than R    Time  8    Period  Weeks    Status  New    Target Date  06/18/19      OT LONG TERM GOAL #3   Title  Function score on PRWHE improve with more than 15 points to participate in sports, and fixing things around house    Baseline  eval PRWHE score for function was 23.5/50    Time  8    Period  Weeks    Status  New    Target Date  06/18/19      OT LONG TERM GOAL #4   Title  Wrist Strength increase for pt to push up from chair , bath tub ,drive  and pull shirt over head    Baseline  wrist AROM in all planes decrease  with pain and cannot do resistance at end range    Time  8    Period  Weeks    Status  New    Target Date  06/18/19            Plan - 04/23/19 1617    Clinical Impression Statement  Pt present at OT eval with diagnosis with Seronegative RA - pt on metrotrexate, and since Nov 30th had 2 infusions of Remicade - next one is next week - pt report improvement in his symptoms since then - his multiple joint pain , edema and stiffness started in Aug 2020 - and he is refer for cont hand stiffness L more than R - pt show decrease AROM in multip joint - but OT at this time will adress UE stiffness - L hand worse than R - edema , pain with ROM ,  decrease flexion and extention with decrease strenght lmiiting his  functional use in ADL's and IADL's    OT Occupational Profile and History  Problem Focused Assessment - Including review of records relating to presenting problem    Occupational performance deficits (Please refer to evaluation for details):  ADL's;IADL's;Rest and Sleep;Education;Leisure;Social Participation;Play    Body Structure / Function / Physical Skills  ADL;Flexibility;ROM;UE functional use;FMC;Dexterity;Edema;Pain;Strength;IADL;Coordination    Rehab Potential  Good    Clinical Decision Making  Limited treatment options, no task modification necessary    Comorbidities Affecting Occupational Performance:  May have comorbidities impacting occupational performance   chronic condition   Modification or Assistance to Complete Evaluation   No modification of tasks or assist necessary to complete eval    OT Frequency  2x / week    OT Duration  8 weeks    OT Treatment/Interventions  Self-care/ADL training;Therapeutic exercise;Patient/family education;Splinting;Paraffin;Fluidtherapy;Contrast Bath;DME and/or AE instruction;Manual Therapy;Passive range of motion    Plan  assess progress with HEP and adjust as needed    OT Home Exercise Plan  see pt instruction    Consulted and Agree with Plan of Care  Patient       Patient will benefit from skilled therapeutic intervention in order to improve the following deficits and impairments:   Body Structure / Function / Physical Skills: ADL, Flexibility, ROM, UE functional use, FMC, Dexterity, Edema, Pain, Strength, IADL, Coordination       Visit Diagnosis: Localized edema - Plan: Ot plan of care cert/re-cert  Stiffness of left hand, not elsewhere classified - Plan: Ot plan of care cert/re-cert  Stiffness of right hand, not elsewhere classified - Plan: Ot plan of care cert/re-cert  Stiffness of left wrist, not elsewhere classified - Plan: Ot plan of care cert/re-cert  Stiffness of right wrist, not elsewhere classified - Plan: Ot plan of care  cert/re-cert  Pain in left hand - Plan: Ot plan of care cert/re-cert  Pain in right hand - Plan: Ot plan of care cert/re-cert  Muscle weakness (generalized) - Plan: Ot plan of care cert/re-cert    Problem List Patient Active Problem List   Diagnosis Date Noted  . Seronegative arthritis 01/15/2019  . BPH (benign prostatic hyperplasia) 06/22/2018  . Giant cell arteritis ([facility]) 02/13/2018  . Microcytic anemia 02/01/2018  . Diet-controlled diabetes mellitus ([facility])   . Hyperlipidemia   . Hypertension     Oletta Cohn OTR/l,CLT 04/23/2019, 4:44 PM  [facility] [address], [place], [address] Phone: [phone]   Fax:  [phone]  Name: Shane Duncan MRN: [id number] Date of Birth: 26-Jan-1951

## 2019-04-23 NOTE — Patient Instructions (Signed)
Contrast on both hands - but can only do heat on L if increase ROM with that  PROM to bilateral DIP , PIP , gentle composite flexion  Tendon glides - block for [facility] and intrinsic fist  Composite flexion to 4.5  cm foam block on L and 3 cm R  8-10 reps  Pain less than 2-3/10   Cont with his compression copper gloves that he feels help  3 x day

## 2019-04-25 ENCOUNTER — Other Ambulatory Visit: Payer: Self-pay

## 2019-04-25 ENCOUNTER — Ambulatory Visit: Payer: Medicare Other | Admitting: Occupational Therapy

## 2019-04-25 DIAGNOSIS — M6281 Muscle weakness (generalized): Secondary | ICD-10-CM

## 2019-04-25 DIAGNOSIS — M25632 Stiffness of left wrist, not elsewhere classified: Secondary | ICD-10-CM | POA: Diagnosis not present

## 2019-04-25 DIAGNOSIS — M25642 Stiffness of left hand, not elsewhere classified: Secondary | ICD-10-CM

## 2019-04-25 DIAGNOSIS — M79641 Pain in right hand: Secondary | ICD-10-CM

## 2019-04-25 DIAGNOSIS — M25631 Stiffness of right wrist, not elsewhere classified: Secondary | ICD-10-CM | POA: Diagnosis not present

## 2019-04-25 DIAGNOSIS — M25641 Stiffness of right hand, not elsewhere classified: Secondary | ICD-10-CM

## 2019-04-25 DIAGNOSIS — M79642 Pain in left hand: Secondary | ICD-10-CM

## 2019-04-25 DIAGNOSIS — R600 Localized edema: Secondary | ICD-10-CM | POA: Diagnosis not present

## 2019-04-25 NOTE — Therapy (Signed)
[facility] [address] [place], [address] Phone: [phone]   Fax:  [phone]  Occupational Therapy Treatment  Patient Details  Name: Shane Duncan MRN: [id number] Date of Birth: 12/11/1950 Referring Provider (OT): Olevia Perches   Encounter Date: 04/25/2019  OT End of Session - 04/25/19 1410    Visit Number  2    Number of Visits  16    Date for OT Re-Evaluation  06/18/19    OT Start Time  1301    OT Stop Time  1355    OT Time Calculation (min)  54 min    Activity Tolerance  Patient tolerated treatment well;Patient limited by pain    Behavior During Therapy  [facility] for tasks assessed/performed       Past Medical History:  Diagnosis Date  . Bronchitis   . Chronic sinusitis   . Esophagitis   . Hemorrhoids   . Hepatitis A   . Hyperlipidemia   . Hypertension   . IFG (impaired fasting glucose)   . Temporal arteritis ([facility]) 01/2018    Past Surgical History:  Procedure Laterality Date  . ACHILLES TENDON SURGERY Right 1997  . ANUS SURGERY  1990's   anal fistula repair  . ARTERY BIOPSY Left 02/05/2018   Procedure: BIOPSY TEMPORAL ARTERY;  Surgeon: Annice Needy, MD;  Location: [facility] ORS;  Service: Vascular;  Laterality: Left;  . TONSILLECTOMY  1961    There were no vitals filed for this visit.  Subjective Assessment - 04/25/19 1407    Subjective   My hands were hurting after last time - but today better and I have done the exercises - maybe little better I think the swelling - was able to wear my glove about 2-3 hrs at night - think that helps    Pertinent History  Per Dr Renard Matter note -Seronegative arthritisNegative CCP negative inflammatory arthritis, multiple joints neck shoulder knees ankles and handS. Synovitis in both hands+ wrists unable to make a SEMI-fist. NO psoriasis. BETTER with methotrexate initiated in November 30, 2018, REMICADE (3.2 MG/KG SINCE 03/18/2019) - had 2nd dosis Dec 4th and again  next week - need help - L hand worse than R , wrist also stiff - L hand swollen - and then pain too with moving    Patient Stated Goals  I want to be able to make fist with both hands, to use my hands to play golf, fix things around the house, grip tools , work out , push up , wash hair, grip during bathing and dressing    Currently in Pain?  Yes    Pain Score  5     Pain Location  Hand   L shoulder and R knee   Pain Orientation  Right;Left    Pain Descriptors / Indicators  Aching;Tender;Tightness    Pain Type  Chronic pain    Pain Onset  More than a month ago            Measurements taken L hand improve in all joints- not recorded in flowsheet this date - cont with L hand worse than R and with edema        OT Treatments/Exercises (OP) - 04/25/19 0001      RUE Paraffin   Number Minutes Paraffin  8 Minutes    RUE Paraffin Location  Hand    Comments  prior to soft tissue  to increase ROM       LUE  Contrast Bath   Time  9 minutes    Comments  prior to soft tissue and ROM      soft tissue mobs - MC spreads and joint mobs to [facility] - gentle traction on all digits  graston tool sweeping tool nr 2 volar wrist and palm L prior to ROM    contrast done to L hand while R in paraffin -hand prior soft tissue and ROM  Pt's wife to cont with  MC spreads, CT spreads and webspace  And interlocking for digits ABD  10 reps prior to HEP    PROM to bilateral DIP , PIP , gentle composite flexion  At home  Done gentle AAROM to [place] flexion and intrinsic interlocking with OT  Tendon glides - block for [facility] and intrinsic fist  Composite flexion to 4  cm foam block on L and 2 cm R  In session  8-10 reps  Pain less than 2-3/10   AAROM for wrist in all planes - pain less than 2/10  PUlleys done and add to HEP for pt to do 20 reps flexion and ABD to bilateral shoulder - pain less than 2/10   Cont with his compression copper gloves that he feels help  3 x day        OT Education - 04/25/19  1409    Education Details  progress and HEP    Person(s) Educated  Patient    Methods  Explanation;Demonstration;Tactile cues;Verbal cues;Handout    Comprehension  Verbal cues required;Returned demonstration;Verbalized understanding       OT Short Term Goals - 04/23/19 1625      OT SHORT TERM GOAL #1   Title  Pt to be independent in HEP to increase digits flexion AROM to grasp 2 cm cylinder objects    Baseline  MC's R 65-85, PIP's 75-85 degrees , L MC 50-60 and PIP's 60-75 - and decrease PIP extentoin at all digits -10 to -35    Time  3    Period  Weeks    Status  New    Target Date  05/14/19      OT SHORT TERM GOAL #2   Title  Bilateral wrist AROM improve in all planes by 10 degrees to ease dressing  and turning doorknob    Baseline  see flowsheet    Time  4    Period  Weeks    Status  New    Target Date  05/21/19        OT Long Term Goals - 04/23/19 1637      OT LONG TERM GOAL #1   Title  Digits flexion improve for pt to touch palm to hold golf club, cut food    Baseline  MC's R 65-85, PIP's 75-85 degrees , L MC 50-60 and PIP's 60-75 - and decrease PIP extentoin at all digits -10 to -35    Time  6    Period  Weeks    Status  New    Target Date  06/04/19      OT LONG TERM GOAL #2   Title  Grip strength increase in bilateral hands to in range for his age to carry more than 10 lbs without increase symptoms , grip tools    Baseline  NT - decrease AROM - pain and edema in L hand worse than R    Time  8    Period  Weeks    Status  New    Target Date  06/18/19      OT LONG TERM GOAL #3   Title  Function score on PRWHE improve with more than 15 points to participate in sports, and fixing things around house    Baseline  eval PRWHE score for function was 23.5/50    Time  8    Period  Weeks    Status  New    Target Date  06/18/19      OT LONG TERM GOAL #4   Title  Wrist Strength increase for pt to push up from chair , bath tub ,drive  and pull shirt over head     Baseline  wrist AROM in all planes decrease  with pain and cannot do resistance at end range    Time  8    Period  Weeks    Status  New    Target Date  06/18/19            Plan - 04/25/19 1410    Clinical Impression Statement  Pt was seen for eval 2 days ago - pt with diagnosis of Seronegative RA - on metrotexate  and REmicade had 2 infusion and next one next week - pt did show increase ROM in L hand - R hand about the same - tolerate treatment well - did provide pt with pulleys and review HEP for shoulder flexoin and ABD    OT Occupational Profile and History  Problem Focused Assessment - Including review of records relating to presenting problem    Occupational performance deficits (Please refer to evaluation for details):  ADL's;IADL's;Rest and Sleep;Education;Leisure;Social Participation;Play    Body Structure / Function / Physical Skills  ADL;Flexibility;ROM;UE functional use;FMC;Dexterity;Edema;Pain;Strength;IADL;Coordination    Rehab Potential  Good    Clinical Decision Making  Limited treatment options, no task modification necessary    Comorbidities Affecting Occupational Performance:  May have comorbidities impacting occupational performance    Modification or Assistance to Complete Evaluation   No modification of tasks or assist necessary to complete eval    OT Frequency  2x / week    OT Duration  8 weeks    OT Treatment/Interventions  Self-care/ADL training;Therapeutic exercise;Patient/family education;Splinting;Paraffin;Fluidtherapy;Contrast Bath;DME and/or AE instruction;Manual Therapy;Passive range of motion    Plan  assess progress with HEP and adjust as needed    OT Home Exercise Plan  see pt instruction    Consulted and Agree with Plan of Care  Patient       Patient will benefit from skilled therapeutic intervention in order to improve the following deficits and impairments:   Body Structure / Function / Physical Skills: ADL, Flexibility, ROM, UE functional use, FMC,  Dexterity, Edema, Pain, Strength, IADL, Coordination       Visit Diagnosis: Localized edema  Stiffness of left hand, not elsewhere classified  Stiffness of right hand, not elsewhere classified  Stiffness of left wrist, not elsewhere classified  Stiffness of right wrist, not elsewhere classified  Pain in left hand  Muscle weakness (generalized)  Pain in right hand    Problem List Patient Active Problem List   Diagnosis Date Noted  . Seronegative arthritis 01/15/2019  . BPH (benign prostatic hyperplasia) 06/22/2018  . Giant cell arteritis ([facility]) 02/13/2018  . Microcytic anemia 02/01/2018  . Diet-controlled diabetes mellitus ([facility])   . Hyperlipidemia   . Hypertension     Oletta Cohn  OTR/l,CLT 04/25/2019, 2:13 PM  [facility] PHYSICAL AND SPORTS MEDICINE [address] [place], [address] Phone: [phone]  Fax:  [phone]  Name: Shane Duncan MRN: [id number] Date of Birth: 1950-12-07

## 2019-04-25 NOTE — Patient Instructions (Signed)
Can do some gentle AAROM for R MC flexion , intrinsic fist   And pulley's for bilateral shoulder flexion and ABD  20 reps after shower or heatingpad

## 2019-04-30 DIAGNOSIS — M1380 Other specified arthritis, unspecified site: Secondary | ICD-10-CM | POA: Diagnosis not present

## 2019-04-30 DIAGNOSIS — M0600 Rheumatoid arthritis without rheumatoid factor, unspecified site: Secondary | ICD-10-CM | POA: Diagnosis not present

## 2019-04-30 DIAGNOSIS — Z79899 Other long term (current) drug therapy: Secondary | ICD-10-CM | POA: Diagnosis not present

## 2019-04-30 DIAGNOSIS — M064 Inflammatory polyarthropathy: Secondary | ICD-10-CM | POA: Diagnosis not present

## 2019-05-01 ENCOUNTER — Other Ambulatory Visit: Payer: Self-pay

## 2019-05-01 ENCOUNTER — Ambulatory Visit: Payer: Medicare Other | Admitting: Occupational Therapy

## 2019-05-01 DIAGNOSIS — M25631 Stiffness of right wrist, not elsewhere classified: Secondary | ICD-10-CM

## 2019-05-01 DIAGNOSIS — M25641 Stiffness of right hand, not elsewhere classified: Secondary | ICD-10-CM | POA: Diagnosis not present

## 2019-05-01 DIAGNOSIS — M6281 Muscle weakness (generalized): Secondary | ICD-10-CM

## 2019-05-01 DIAGNOSIS — M79641 Pain in right hand: Secondary | ICD-10-CM

## 2019-05-01 DIAGNOSIS — M25632 Stiffness of left wrist, not elsewhere classified: Secondary | ICD-10-CM | POA: Diagnosis not present

## 2019-05-01 DIAGNOSIS — M25642 Stiffness of left hand, not elsewhere classified: Secondary | ICD-10-CM | POA: Diagnosis not present

## 2019-05-01 DIAGNOSIS — R600 Localized edema: Secondary | ICD-10-CM | POA: Diagnosis not present

## 2019-05-01 DIAGNOSIS — M79642 Pain in left hand: Secondary | ICD-10-CM | POA: Diagnosis not present

## 2019-05-01 NOTE — Therapy (Signed)
[facility] PHYS[facility] AND [facility]ORTS MEDICINE [address], [place], [address] Phone: [phone]   Fax:  [phone]  Occupational Therapy Treatment  Patient Details  Name: Shane Duncan MRN: [id number] Date of Birth: 1951/03/21 Referring Provider (OT): Olevia Perches   Encounter Date: 05/01/2019  OT End of Session - 05/01/19 1325    Visit Number  3    Number of Visits  16    Date for OT Re-Evaluation  06/18/19    OT Start Time  1303    OT Stop Time  1357    OT Time Calculation (min)  54 min    Activity Tolerance  Patient tolerated treatment well;Patient limited by pain    Behavior During Therapy  [facility] for tasks assessed/performed       Past Medical History:  Diagnosis Date  . Bronchitis   . Chronic sinusitis   . Esophagitis   . Hemorrhoids   . Hepatitis A   . Hyperlipidemia   . Hypertension   . IFG (impaired fasting glucose)   . Temporal arteritis (HCC) 01/2018    Past Surgical History:  Procedure Laterality Date  . ACHILLES TENDON SURGERY Right 1997  . ANUS SURGERY  1990's   anal fistula repair  . ARTERY BIOPSY Left 02/05/2018   Procedure: BIOPSY TEMPORAL ARTERY;  Surgeon: Annice Needy, MD;  Location: [facility] ORS;  Service: Vascular;  Laterality: Left;  . TONSILLECTOMY  1961    There were no vitals filed for this visit.  Subjective Assessment - 05/01/19 1320    Subjective   MY do not sleep good - my knees and shoulders bother me at night and did not had tramadol - I did had my 3rd Remicade    Pertinent History  Per Dr Renard Matter note -Seronegative arthritisNegative CCP negative inflammatory arthritis, multiple joints neck shoulder knees ankles and handS. Synovitis in both hands+ wrists unable to make a SEMI-fist. NO psoriasis. BETTER with methotrexate initiated in November 30, 2018, REMICADE (3.2 MG/KG SINCE 03/18/2019) - had 2nd dosis Dec 4th and again next week - need help - L hand worse than R , wrist also stiff - L hand swollen  - and then pain too with moving    Patient Stated Goals  I want to be able to make fist with both hands, to use my hands to play golf, fix things around the house, grip tools , work out , push up , wash hair, grip during bathing and dressing    Currently in Pain?  Yes    Pain Score  4     Pain Location  Shoulder   knees   Pain Orientation  Right;Left    Pain Descriptors / Indicators  Aching    Pain Type  Chronic pain         [facility] OT Assessment - 05/01/19 0001      AROM   Right Wrist Extension  44 Degrees    Right Wrist Flexion  70 Degrees    Left Wrist Extension  40 Degrees    Left Wrist Flexion  54 Degrees      Right Hand AROM   R Index  MCP 0-90  75 Degrees    R Index PIP 0-100  80 Degrees    R Long  MCP 0-90  75 Degrees    R Long PIP 0-100  85 Degrees    R Ring  MCP 0-90  75 Degrees    R Ring PIP 0-100  85 Degrees    R Little  MCP 0-90  85 Degrees    R Little PIP 0-100  80 Degrees      Left Hand AROM   L Index  MCP 0-90  60 Degrees    L Index PIP 0-100  80 Degrees   -30   L Long  MCP 0-90  65 Degrees    L Long PIP 0-100  70 Degrees    L Ring  MCP 0-90  65 Degrees    L Ring PIP 0-100  75 Degrees    L Little  MCP 0-90  75 Degrees    L Little PIP 0-100  70 Degrees      measurements taken in bil hands - progress in flexion bil hands - see flow sheets          OT Treatments/Exercises (OP) - 05/01/19 0001      RUE Fluidotherapy   Number Minutes Fluidotherapy  10 Minutes    RUE Fluidotherapy Location  Wrist;Hand    Comments  AROM for wrist and hand in all planes       LUE Contrast Bath   Time  9 minutes    Comments  prior to soft tissue mobs        soft tissue mobs - MC spreads and joint mobs to [facility] - gentle traction on all digits  graston tool sweeping tool nr 2 volar wrist and palm R and  L prior to ROM    contrast done to L hand while R in flluido-hand prior soft tissue and ROM  Pt's wife to cont with  MC spreads, CT spreads and webspace  And  interlocking for digits ABD - pt tight this date and pain in L hand with this - reinforce for pt to do - he did not do it  10 reps prior to HEP   PROM to bilateral DIP , PIP , gentle composite flexion  Done gentle AAROM to MC flexion and intrinsic interlocking with OT  Tendon glides - block for [facility] and intrinsic fist  Composite flexion to 3 cm foam block on L and 2 cm R  In session  8-10 reps  Pain less than 2-3/10   AAROM for wrist in all planes - pain less than 2/10   Cont with his compression copper gloves that he feels help  3 x day       OT Education - 05/01/19 1325    Education Details  progress and HEP    Person(s) Educated  Patient    Methods  Explanation;Demonstration;Tactile cues;Verbal cues;Handout    Comprehension  Verbal cues required;Returned demonstration;Verbalized understanding       OT Short Term Goals - 04/23/19 1625      OT SHORT TERM GOAL #1   Title  Pt to be independent in HEP to increase digits flexion AROM to grasp 2 cm cylinder objects    Baseline  MC's R 65-85, PIP's 75-85 degrees , L MC 50-60 and PIP's 60-75 - and decrease PIP extentoin at all digits -10 to -35    Time  3    Period  Weeks    Status  New    Target Date  05/14/19      OT SHORT TERM GOAL #2   Title  Bilateral wrist AROM improve in all planes by 10 degrees to ease dressing  and turning doorknob    Baseline  see flowsheet    Time  4    Period  Weeks    Status  New    Target Date  05/21/19        OT Long Term Goals - 04/23/19 1637      OT LONG TERM GOAL #1   Title  Digits flexion improve for pt to touch palm to hold golf club, cut food    Baseline  MC's R 65-85, PIP's 75-85 degrees , L MC 50-60 and PIP's 60-75 - and decrease PIP extentoin at all digits -10 to -35    Time  6    Period  Weeks    Status  New    Target Date  06/04/19      OT LONG TERM GOAL #2   Title  Grip strength increase in bilateral hands to in range for his age to carry more than 10 lbs without  increase symptoms , grip tools    Baseline  NT - decrease AROM - pain and edema in L hand worse than R    Time  8    Period  Weeks    Status  New    Target Date  06/18/19      OT LONG TERM GOAL #3   Title  Function score on PRWHE improve with more than 15 points to participate in sports, and fixing things around house    Baseline  eval PRWHE score for function was 23.5/50    Time  8    Period  Weeks    Status  New    Target Date  06/18/19      OT LONG TERM GOAL #4   Title  Wrist Strength increase for pt to push up from chair , bath tub ,drive  and pull shirt over head    Baseline  wrist AROM in all planes decrease  with pain and cannot do resistance at end range    Time  8    Period  Weeks    Status  New    Target Date  06/18/19            Plan - 05/01/19 1326    Clinical Impression Statement  Pt arrive with less edema in bilateral hands - showed progress in bilateral digits flexion , and wrist - pt cont to have increase edema , stiffness, pain in all joints    OT Occupational Profile and History  Problem Focused Assessment - Including review of records relating to presenting problem    Occupational performance deficits (Please refer to evaluation for details):  ADL's;IADL's;Rest and Sleep;Education;Leisure;Social Participation;Play    Body Structure / Function / Physical Skills  ADL;Flexibility;ROM;UE functional use;FMC;Dexterity;Edema;Pain;Strength;IADL;Coordination    Rehab Potential  Good    Clinical Decision Making  Limited treatment options, no task modification necessary    Comorbidities Affecting Occupational Performance:  May have comorbidities impacting occupational performance    Modification or Assistance to Complete Evaluation   No modification of tasks or assist necessary to complete eval    OT Frequency  2x / week    OT Duration  8 weeks    OT Treatment/Interventions  Self-care/ADL training;Therapeutic exercise;Patient/family  education;Splinting;Paraffin;Fluidtherapy;Contrast Bath;DME and/or AE instruction;Manual Therapy;Passive range of motion    Plan  assess progress with HEP and adjust as needed    OT Home Exercise Plan  see pt instruction    Consulted and Agree with Plan of Care  Patient       Patient will benefit from skilled therapeutic intervention in order to improve the following deficits and impairments:  Body Structure / Function / Physical Skills: ADL, Flexibility, ROM, UE functional use, FMC, Dexterity, Edema, Pain, Strength, IADL, Coordination       Visit Diagnosis: Stiffness of left hand, not elsewhere classified  Stiffness of right hand, not elsewhere classified  Stiffness of left wrist, not elsewhere classified  Stiffness of right wrist, not elsewhere classified  Pain in left hand  Muscle weakness (generalized)  Pain in right hand  Localized edema    Problem List Patient Active Problem List   Diagnosis Date Noted  . Seronegative arthritis 01/15/2019  . BPH (benign prostatic hyperplasia) 06/22/2018  . Giant cell arteritis (HCC) 02/13/2018  . Microcytic anemia 02/01/2018  . Diet-controlled diabetes mellitus (HCC)   . Hyperlipidemia   . Hypertension     Oletta Cohn OTR/L,CLT 05/01/2019, 4:33 PM  [facility] PHYSICAL AND SPORTS MEDICINE [address] [place], [address] Phone: [phone]   Fax:  [phone]  Name: Shane Duncan MRN: [id number] Date of Birth: 1950/12/13

## 2019-05-01 NOTE — Patient Instructions (Signed)
Same HEP - reinforce to do digits extention of PIP's  And interlocking of digits

## 2019-05-03 ENCOUNTER — Ambulatory Visit: Payer: Medicare Other | Admitting: Occupational Therapy

## 2019-05-03 ENCOUNTER — Other Ambulatory Visit: Payer: Self-pay

## 2019-05-03 DIAGNOSIS — R600 Localized edema: Secondary | ICD-10-CM | POA: Diagnosis not present

## 2019-05-03 DIAGNOSIS — M25632 Stiffness of left wrist, not elsewhere classified: Secondary | ICD-10-CM | POA: Diagnosis not present

## 2019-05-03 DIAGNOSIS — M25641 Stiffness of right hand, not elsewhere classified: Secondary | ICD-10-CM | POA: Diagnosis not present

## 2019-05-03 DIAGNOSIS — M25631 Stiffness of right wrist, not elsewhere classified: Secondary | ICD-10-CM

## 2019-05-03 DIAGNOSIS — M79642 Pain in left hand: Secondary | ICD-10-CM

## 2019-05-03 DIAGNOSIS — M79641 Pain in right hand: Secondary | ICD-10-CM

## 2019-05-03 DIAGNOSIS — M25642 Stiffness of left hand, not elsewhere classified: Secondary | ICD-10-CM | POA: Diagnosis not present

## 2019-05-03 DIAGNOSIS — M6281 Muscle weakness (generalized): Secondary | ICD-10-CM

## 2019-05-03 NOTE — Therapy (Signed)
[facility] [place] [facility]TS MEDICINE [address] [place], [address] Phone: [phone]   Fax:  [phone]  Occupational Therapy Treatment  Patient Details  Name: Shane Duncan MRN: [id number] Date of Birth: Nov 14, 1950 Referring Provider (OT): Olevia Perches   Encounter Date: 05/03/2019  OT End of Session - 05/03/19 1104    Visit Number  4    Number of Visits  16    Date for OT Re-Evaluation  06/18/19    OT Start Time  1047    OT Stop Time  1132    OT Time Calculation (min)  45 min    Activity Tolerance  Patient tolerated treatment well;Patient limited by pain    Behavior During Therapy  [facility] for tasks assessed/performed       Past Medical History:  Diagnosis Date  . Bronchitis   . Chronic sinusitis   . Esophagitis   . Hemorrhoids   . Hepatitis A   . Hyperlipidemia   . Hypertension   . IFG (impaired fasting glucose)   . Temporal arteritis (HCC) 01/2018    Past Surgical History:  Procedure Laterality Date  . ACHILLES TENDON SURGERY Right 1997  . ANUS SURGERY  1990's   anal fistula repair  . ARTERY BIOPSY Left 02/05/2018   Procedure: BIOPSY TEMPORAL ARTERY;  Surgeon: Annice Needy, MD;  Location: [facility] ORS;  Service: Vascular;  Laterality: Left;  . TONSILLECTOMY  1961    There were no vitals filed for this visit.  Subjective Assessment - 05/03/19 1057    Subjective   My shoulder bothers me the most - my hands was sore after last time - but better yesterday- R hand coming a long - L still swollen    Pertinent History  Per Dr Renard Matter note -Seronegative arthritisNegative CCP negative inflammatory arthritis, multiple joints neck shoulder knees ankles and handS. Synovitis in both hands+ wrists unable to make a SEMI-fist. NO psoriasis. BETTER with methotrexate initiated in November 30, 2018, REMICADE (3.2 MG/KG SINCE 03/18/2019) - had 2nd dosis Dec 4th and again next week - need help - L hand worse than R , wrist also stiff - L  hand swollen - and then pain too with moving    Patient Stated Goals  I want to be able to make fist with both hands, to use my hands to play golf, fix things around the house, grip tools , work out , push up , wash hair, grip during bathing and dressing    Currently in Pain?  Yes    Pain Score  4     Pain Location  Shoulder    Pain Orientation  Right;Left    Pain Descriptors / Indicators  Aching    Pain Type  Chronic pain    Pain Onset  More than a month ago    Pain Frequency  Constant              decrease edema in R hand - more stiffness - done paraffin this date  Edema and pain still in L hand - contrast done      OT Treatments/Exercises (OP) - 05/03/19 0001      RUE Paraffin   Number Minutes Paraffin  9 Minutes    RUE Paraffin Location  Hand    Comments  coban flexion stretch       LUE Contrast Bath   Time  9 minutes    Comments  prior to soft tissue  soft tissue mobs - MC spreads and joint mobs to [facility] - gentle traction on all digits  graston tool sweeping tool nr 2 volar wrist and palm R and  L prior to ROM Interlocking by OT in bilateral hands   contrast done to L handwhile R hand in paraffin- priorsoft tissue and ROM Pt'swife tocont withMC spreads, CT spreads and webspace     Done gentle AAROM to MC flexion and intrinsic interlocking with OT Tendon glides - block for [facility] and intrinsic fist  Composite flexion to 3 cm foam block on L and2cm R In session 8-10 reps  Pain less than 2-3/10 Reinforce for pt to do same to 2-3 cm foam block at home during HEP  Yellow light foam block for R hand gripping - to do 15 reps pain free after tendon glides   AAROM for wrist in all planes - pain less than 2/10   Fitted with isotoner glove to wear on L hand some during day and night time        OT Education - 05/03/19 1104    Education Details  HEP changes    Person(s) Educated  Patient    Methods  Explanation;Demonstration;Tactile  cues;Verbal cues;Handout    Comprehension  Verbal cues required;Returned demonstration;Verbalized understanding       OT Short Term Goals - 04/23/19 1625      OT SHORT TERM GOAL #1   Title  Pt to be independent in HEP to increase digits flexion AROM to grasp 2 cm cylinder objects    Baseline  MC's R 65-85, PIP's 75-85 degrees , L MC 50-60 and PIP's 60-75 - and decrease PIP extentoin at all digits -10 to -35    Time  3    Period  Weeks    Status  New    Target Date  05/14/19      OT SHORT TERM GOAL #2   Title  Bilateral wrist AROM improve in all planes by 10 degrees to ease dressing  and turning doorknob    Baseline  see flowsheet    Time  4    Period  Weeks    Status  New    Target Date  05/21/19        OT Long Term Goals - 04/23/19 1637      OT LONG TERM GOAL #1   Title  Digits flexion improve for pt to touch palm to hold golf club, cut food    Baseline  MC's R 65-85, PIP's 75-85 degrees , L MC 50-60 and PIP's 60-75 - and decrease PIP extentoin at all digits -10 to -35    Time  6    Period  Weeks    Status  New    Target Date  06/04/19      OT LONG TERM GOAL #2   Title  Grip strength increase in bilateral hands to in range for his age to carry more than 10 lbs without increase symptoms , grip tools    Baseline  NT - decrease AROM - pain and edema in L hand worse than R    Time  8    Period  Weeks    Status  New    Target Date  06/18/19      OT LONG TERM GOAL #3   Title  Function score on PRWHE improve with more than 15 points to participate in sports, and fixing things around house    Baseline  eval PRWHE score for  function was 23.5/50    Time  8    Period  Weeks    Status  New    Target Date  06/18/19      OT LONG TERM GOAL #4   Title  Wrist Strength increase for pt to push up from chair , bath tub ,drive  and pull shirt over head    Baseline  wrist AROM in all planes decrease  with pain and cannot do resistance at end range    Time  8    Period  Weeks     Status  New    Target Date  06/18/19            Plan - 05/03/19 1105    Clinical Impression Statement  Pt show slow but steady progress  in AROM for fisting in R hand more than L hand - progres limited by edema and pain in L hand - fitted this date with isotoner glove to wear on L hand - some light gripping able to do with R hand    OT Occupational Profile and History  Problem Focused Assessment - Including review of records relating to presenting problem    Occupational performance deficits (Please refer to evaluation for details):  ADL's;IADL's;Rest and Sleep;Education;Leisure;Social Participation;Play    Body Structure / Function / Physical Skills  ADL;Flexibility;ROM;UE functional use;FMC;Dexterity;Edema;Pain;Strength;IADL;Coordination    Rehab Potential  Good    Clinical Decision Making  Limited treatment options, no task modification necessary    Comorbidities Affecting Occupational Performance:  May have comorbidities impacting occupational performance    Modification or Assistance to Complete Evaluation   No modification of tasks or assist necessary to complete eval    OT Frequency  2x / week    OT Duration  8 weeks    OT Treatment/Interventions  Self-care/ADL training;Therapeutic exercise;Patient/family education;Splinting;Paraffin;Fluidtherapy;Contrast Bath;DME and/or AE instruction;Manual Therapy;Passive range of motion    Plan  assess progress with HEP and adjust as needed    OT Home Exercise Plan  see pt instruction    Consulted and Agree with Plan of Care  Patient       Patient will benefit from skilled therapeutic intervention in order to improve the following deficits and impairments:   Body Structure / Function / Physical Skills: ADL, Flexibility, ROM, UE functional use, FMC, Dexterity, Edema, Pain, Strength, IADL, Coordination       Visit Diagnosis: Stiffness of left hand, not elsewhere classified  Stiffness of right hand, not elsewhere classified  Stiffness  of left wrist, not elsewhere classified  Stiffness of right wrist, not elsewhere classified  Pain in left hand  Muscle weakness (generalized)  Localized edema  Pain in right hand    Problem List Patient Active Problem List   Diagnosis Date Noted  . Seronegative arthritis 01/15/2019  . BPH (benign prostatic hyperplasia) 06/22/2018  . Giant cell arteritis (HCC) 02/13/2018  . Microcytic anemia 02/01/2018  . Diet-controlled diabetes mellitus (HCC)   . Hyperlipidemia   . Hypertension     Oletta Cohn OTR/L,CLT 05/03/2019, 1:36 PM  [place] [facility] PHYSICAL AND SPORTS MEDICINE [address] [place], [address] Phone: [phone]   Fax:  [phone]  Name: Shane Duncan MRN: [id number] Date of Birth: 1950/05/06

## 2019-05-03 NOTE — Patient Instructions (Signed)
Same - but fitted with isotoner glove for L hand  Some during day and night time  Composite fist after tendon glides - fisting to 2-3 cm foam block  New one provided  And then soft yellow foam block for gripping to do with R hand

## 2019-05-06 ENCOUNTER — Ambulatory Visit: Payer: Medicare Other | Admitting: Occupational Therapy

## 2019-05-06 ENCOUNTER — Other Ambulatory Visit: Payer: Self-pay

## 2019-05-06 DIAGNOSIS — M25641 Stiffness of right hand, not elsewhere classified: Secondary | ICD-10-CM

## 2019-05-06 DIAGNOSIS — M25642 Stiffness of left hand, not elsewhere classified: Secondary | ICD-10-CM | POA: Diagnosis not present

## 2019-05-06 DIAGNOSIS — M25632 Stiffness of left wrist, not elsewhere classified: Secondary | ICD-10-CM

## 2019-05-06 DIAGNOSIS — M79642 Pain in left hand: Secondary | ICD-10-CM

## 2019-05-06 DIAGNOSIS — M25631 Stiffness of right wrist, not elsewhere classified: Secondary | ICD-10-CM | POA: Diagnosis not present

## 2019-05-06 DIAGNOSIS — R600 Localized edema: Secondary | ICD-10-CM | POA: Diagnosis not present

## 2019-05-06 DIAGNOSIS — M6281 Muscle weakness (generalized): Secondary | ICD-10-CM

## 2019-05-06 DIAGNOSIS — M79641 Pain in right hand: Secondary | ICD-10-CM

## 2019-05-06 NOTE — Patient Instructions (Signed)
Pt to do HEP for hands every day - definitely L hand - because his using R hand more than L during day And then after shoulder exercises after shower  And after eating at table - put 2 pillows in chair and practice sit<> stand  And watching tv - can do contrast and hand exercises as well as wrist   Don't have to do them all at the same time

## 2019-05-06 NOTE — Therapy (Signed)
[place] [facility] PH[facility]ICINE [address], [place], [address] Phone: [phone]   Fax:  [phone]  Occupational Therapy Treatment  Patient Details  Name: Shane Duncan MRN: [id number] Date of Birth: January 07, 1951 Referring Provider (OT): Olevia Perches   Encounter Date: 05/06/2019  OT End of Session - 05/06/19 1414    Visit Number  5    Number of Visits  16    Date for OT Re-Evaluation  06/18/19    OT Start Time  1347    OT Stop Time  1438    OT Time Calculation (min)  51 min    Activity Tolerance  Patient tolerated treatment well;Patient limited by pain    Behavior During Therapy  [facility] for tasks assessed/performed       Past Medical History:  Diagnosis Date  . Bronchitis   . Chronic sinusitis   . Esophagitis   . Hemorrhoids   . Hepatitis A   . Hyperlipidemia   . Hypertension   . IFG (impaired fasting glucose)   . Temporal arteritis (HCC) 01/2018    Past Surgical History:  Procedure Laterality Date  . ACHILLES TENDON SURGERY Right 1997  . ANUS SURGERY  1990's   anal fistula repair  . ARTERY BIOPSY Left 02/05/2018   Procedure: BIOPSY TEMPORAL ARTERY;  Surgeon: Annice Needy, MD;  Location: [facility] ORS;  Service: Vascular;  Laterality: Left;  . TONSILLECTOMY  1961    There were no vitals filed for this visit.  Subjective Assessment - 05/06/19 1412    Subjective   I done some of my hand exercises Sat one time , and Friday - but not today - feels good today my body - used the massager on my shoulders last night and was not to tired after my shower today    Pertinent History  Per Dr Renard Matter note -Seronegative arthritisNegative CCP negative inflammatory arthritis, multiple joints neck shoulder knees ankles and handS. Synovitis in both hands+ wrists unable to make a SEMI-fist. NO psoriasis. BETTER with methotrexate initiated in November 30, 2018, REMICADE (3.2 MG/KG SINCE 03/18/2019) - had 2nd dosis Dec 4th and again next  week - need help - L hand worse than R , wrist also stiff - L hand swollen - and then pain too with moving    Patient Stated Goals  I want to be able to make fist with both hands, to use my hands to play golf, fix things around the house, grip tools , work out , push up , wash hair, grip during bathing and dressing    Currently in Pain?  Yes    Pain Score  4     Pain Location  Shoulder    Pain Orientation  Left    Pain Descriptors / Indicators  Aching    Pain Type  Chronic pain    Pain Onset  More than a month ago    Pain Frequency  Constant         [facility] OT Assessment - 05/06/19 0001      AROM   Right Wrist Extension  44 Degrees    Right Wrist Flexion  74 Degrees    Left Wrist Extension  40 Degrees    Left Wrist Flexion  58 Degrees      Strength   Right Hand Grip (lbs)  25    Right Hand Lateral Pinch  20 lbs    Right Hand 3 Point Pinch  15 lbs  Left Hand Grip (lbs)  12    Left Hand Lateral Pinch  17 lbs    Left Hand 3 Point Pinch  10 lbs      Right Hand AROM   R Index  MCP 0-90  75 Degrees    R Index PIP 0-100  85 Degrees    R Long  MCP 0-90  80 Degrees    R Long PIP 0-100  85 Degrees    R Ring  MCP 0-90  75 Degrees    R Ring PIP 0-100  82 Degrees    R Little  MCP 0-90  82 Degrees    R Little PIP 0-100  84 Degrees      Left Hand AROM   L Index  MCP 0-90  60 Degrees    L Index PIP 0-100  75 Degrees    L Long  MCP 0-90  60 Degrees    L Long PIP 0-100  65 Degrees    L Ring  MCP 0-90  65 Degrees    L Ring PIP 0-100  75 Degrees    L Little  MCP 0-90  80 Degrees    L Little PIP 0-100  70 Degrees       AROM digits taken - progress from [facility] to now and in session R more than L - cont to have edema , pain in L hand limiting progress  pt to do hand exercises with contrast and isotoner glove every day  Did get baseline for grip and prehension strength - see flowsheet          OT Treatments/Exercises (OP) - 05/06/19 0001      RUE Paraffin   Number Minutes  Paraffin  9 Minutes    RUE Paraffin Location  --   hand   Comments  coban flexion wrap -to all digits       LUE Contrast Bath   Time  9 minutes    Comments  prior to soft tissue mobs         soft tissue mobs - MC spreads and joint mobs to [facility] - gentle traction on all digits   prior to ROM-tenderness over A1pulley's of 3rd and 4th - L hand and R 3rd  Interlocking by OT in bilateral hands   contrast done to L handwhile R hand in paraffin- priorsoft tissue and ROM Pt'swife tocont withMC spreads, CT spreads and webspace     Done gentle AAROM to MC flexion and intrinsic interlocking with OT Tendon glides - block for MC and intrinsic fist  Composite flexion to3cm foam block on L and2cm R In session 8-10 reps  Pain less than 2-3/10 Reinforce for pt to do same to 2-3 cm foam block at home during HEP  And NEED TO DO HAND EXERCISES DAILY  Yellow light foam block for R hand gripping - to do 15 reps pain free after tendon glides   AAROM for wrist in all planes - pain less than 2/10   Cont with isotoner glove to wear on L hand some during day and night time          OT Education - 05/06/19 1446    Education Details  HEP changes    Person(s) Educated  Patient    Methods  Explanation;Demonstration;Tactile cues;Verbal cues;Handout    Comprehension  Verbal cues required;Returned demonstration;Verbalized understanding       OT Short Term Goals - 04/23/19 1625      OT SHORT TERM  GOAL #1   Title  Pt to be independent in HEP to increase digits flexion AROM to grasp 2 cm cylinder objects    Baseline  MC's R 65-85, PIP's 75-85 degrees , L MC 50-60 and PIP's 60-75 - and decrease PIP extentoin at all digits -10 to -35    Time  3    Period  Weeks    Status  New    Target Date  05/14/19      OT SHORT TERM GOAL #2   Title  Bilateral wrist AROM improve in all planes by 10 degrees to ease dressing  and turning doorknob    Baseline  see flowsheet    Time  4     Period  Weeks    Status  New    Target Date  05/21/19        OT Long Term Goals - 04/23/19 1637      OT LONG TERM GOAL #1   Title  Digits flexion improve for pt to touch palm to hold golf club, cut food    Baseline  MC's R 65-85, PIP's 75-85 degrees , L MC 50-60 and PIP's 60-75 - and decrease PIP extentoin at all digits -10 to -35    Time  6    Period  Weeks    Status  New    Target Date  06/04/19      OT LONG TERM GOAL #2   Title  Grip strength increase in bilateral hands to in range for his age to carry more than 10 lbs without increase symptoms , grip tools    Baseline  NT - decrease AROM - pain and edema in L hand worse than R    Time  8    Period  Weeks    Status  New    Target Date  06/18/19      OT LONG TERM GOAL #3   Title  Function score on PRWHE improve with more than 15 points to participate in sports, and fixing things around house    Baseline  eval PRWHE score for function was 23.5/50    Time  8    Period  Weeks    Status  New    Target Date  06/18/19      OT LONG TERM GOAL #4   Title  Wrist Strength increase for pt to push up from chair , bath tub ,drive  and pull shirt over head    Baseline  wrist AROM in all planes decrease  with pain and cannot do resistance at end range    Time  8    Period  Weeks    Status  New    Target Date  06/18/19            Plan - 05/06/19 1415    Clinical Impression Statement  Pt show slow progress with some pain on volar palm on L worse than R - and edema still cont to be more in L hand - pt encourage to do HEP for hands every day and incoporate HEP in daily activites - not all at one time    OT Occupational Profile and History  Problem Focused Assessment - Including review of records relating to presenting problem    Occupational performance deficits (Please refer to evaluation for details):  ADL's;IADL's;Rest and Sleep;Education;Leisure;Social Participation;Play    Body Structure / Function / Physical Skills   ADL;Flexibility;ROM;UE functional use;FMC;Dexterity;Edema;Pain;Strength;IADL;Coordination    Rehab Potential  Good  Clinical Decision Making  Limited treatment options, no task modification necessary    Comorbidities Affecting Occupational Performance:  May have comorbidities impacting occupational performance    Modification or Assistance to Complete Evaluation   No modification of tasks or assist necessary to complete eval    OT Frequency  2x / week    OT Duration  8 weeks    OT Treatment/Interventions  Self-care/ADL training;Therapeutic exercise;Patient/family education;Splinting;Paraffin;Fluidtherapy;Contrast Bath;DME and/or AE instruction;Manual Therapy;Passive range of motion    Plan  assess progress with HEP and adjust as needed    OT Home Exercise Plan  see pt instruction    Consulted and Agree with Plan of Care  Patient       Patient will benefit from skilled therapeutic intervention in order to improve the following deficits and impairments:   Body Structure / Function / Physical Skills: ADL, Flexibility, ROM, UE functional use, FMC, Dexterity, Edema, Pain, Strength, IADL, Coordination       Visit Diagnosis: Stiffness of left hand, not elsewhere classified  Stiffness of right hand, not elsewhere classified  Stiffness of left wrist, not elsewhere classified  Stiffness of right wrist, not elsewhere classified  Pain in left hand  Muscle weakness (generalized)  Pain in right hand  Localized edema    Problem List Patient Active Problem List   Diagnosis Date Noted  . Seronegative arthritis 01/15/2019  . BPH (benign prostatic hyperplasia) 06/22/2018  . Giant cell arteritis (HCC) 02/13/2018  . Microcytic anemia 02/01/2018  . Diet-controlled diabetes mellitus (HCC)   . Hyperlipidemia   . Hypertension     Oletta Cohn OTR/L,CLT 05/06/2019, 2:48 PM  [facility] PHYSICAL AND SPORTS MEDICINE [address], [place],  [address] Phone: [phone]   Fax:  [phone]  Name: Shane Duncan MRN: [id number] Date of Birth: [birth year]

## 2019-05-09 ENCOUNTER — Other Ambulatory Visit: Payer: Self-pay

## 2019-05-09 ENCOUNTER — Ambulatory Visit: Payer: Medicare Other | Admitting: Occupational Therapy

## 2019-05-09 DIAGNOSIS — M25641 Stiffness of right hand, not elsewhere classified: Secondary | ICD-10-CM | POA: Diagnosis not present

## 2019-05-09 DIAGNOSIS — M79642 Pain in left hand: Secondary | ICD-10-CM | POA: Diagnosis not present

## 2019-05-09 DIAGNOSIS — M25631 Stiffness of right wrist, not elsewhere classified: Secondary | ICD-10-CM

## 2019-05-09 DIAGNOSIS — M6281 Muscle weakness (generalized): Secondary | ICD-10-CM

## 2019-05-09 DIAGNOSIS — M25632 Stiffness of left wrist, not elsewhere classified: Secondary | ICD-10-CM

## 2019-05-09 DIAGNOSIS — R600 Localized edema: Secondary | ICD-10-CM | POA: Diagnosis not present

## 2019-05-09 DIAGNOSIS — M25642 Stiffness of left hand, not elsewhere classified: Secondary | ICD-10-CM | POA: Diagnosis not present

## 2019-05-09 DIAGNOSIS — M79641 Pain in right hand: Secondary | ICD-10-CM

## 2019-05-09 NOTE — Therapy (Signed)
[place] [facility]TS MEDICINE [address], [place], [address] Phone: [phone]   Fax:  [phone]  Occupational Therapy Treatment  Patient Details  Name: Shane Duncan MRN: [id number] Date of Birth: 1950/11/22 Referring Provider (OT): Olevia Perches   Encounter Date: 05/09/2019  OT End of Session - 05/09/19 1203    Visit Number  6    Number of Visits  16    Date for OT Re-Evaluation  06/18/19    OT Start Time  1147    OT Stop Time  1230    OT Time Calculation (min)  43 min    Activity Tolerance  Patient tolerated treatment well;Patient limited by pain    Behavior During Therapy  [facility] for tasks assessed/performed       Past Medical History:  Diagnosis Date  . Bronchitis   . Chronic sinusitis   . Esophagitis   . Hemorrhoids   . Hepatitis A   . Hyperlipidemia   . Hypertension   . IFG (impaired fasting glucose)   . Temporal arteritis (HCC) 01/2018    Past Surgical History:  Procedure Laterality Date  . ACHILLES TENDON SURGERY Right 1997  . ANUS SURGERY  1990's   anal fistula repair  . ARTERY BIOPSY Left 02/05/2018   Procedure: BIOPSY TEMPORAL ARTERY;  Surgeon: Annice Needy, MD;  Location: [facility] ORS;  Service: Vascular;  Laterality: Left;  . TONSILLECTOMY  1961    There were no vitals filed for this visit.  Subjective Assessment - 05/09/19 1154    Subjective   I feel good- sleeping better, have more energy - but sometimes I over do it - like in yard playing with dog- hands coming along - using them more- trying to use my L more    Pertinent History  Per Dr Renard Matter note -Seronegative arthritisNegative CCP negative inflammatory arthritis, multiple joints neck shoulder knees ankles and handS. Synovitis in both hands+ wrists unable to make a SEMI-fist. NO psoriasis. BETTER with methotrexate initiated in November 30, 2018, REMICADE (3.2 MG/KG SINCE 03/18/2019) - had 2nd dosis Dec 4th and again next week - need help - L  hand worse than R , wrist also stiff - L hand swollen - and then pain too with moving    Patient Stated Goals  I want to be able to make fist with both hands, to use my hands to play golf, fix things around the house, grip tools , work out , push up , wash hair, grip during bathing and dressing    Currently in Pain?  Yes    Pain Score  6     Pain Location  Hand    Pain Orientation  Right;Left    Pain Descriptors / Indicators  Aching    Pain Type  Chronic pain    Pain Onset  More than a month ago    Aggravating Factors   with PROM                   OT Treatments/Exercises (OP) - 05/09/19 0001      RUE Paraffin   Number Minutes Paraffin  9 Minutes    RUE Paraffin Location  Hand    Comments  intrinsic stretch -       LUE Paraffin   Number Minutes Paraffin  9 Minutes    LUE Paraffin Location  Hand    Comments  coban flexion wrap to all digits  soft tissue mobs - MC spreads and joint mobs to [facility] - gentle traction on all digits   prior to ROM-tenderness over A1pulley's of 3rd and 4th - L hand and R 3rd  Interlocking by OT in bilateral handsand interlocking into intrinsic fist graston tool nr 2 for volar brushing in hands and digtis and sweeping over volar wrist and forearm prior to ROM    Pt'swife tocont [place] spreads, CT spreads and webspace     Done gentle AAROM to MC flexion and intrinsic interlocking with OT- pt to focus on R for intrinsic fist Tendon glides - block for MC and intrinsic fist  Composite PROM flexion to all digits  Composite flexion AROM  to3cm foam block on L and2cm R In session 8-10 reps  Pain less than 2-3/10 Reinforce for pt to do same to 2-3 cm foam block at home during HEP  And NEED TO DO HAND EXERCISES DAILY  Yellow light foam block for R hand gripping - to do 15 reps pain free after tendon glides  AAROM for wrist in all planes - pain less than 2/10   Cont with isotoner glove to wear on L hand some during day  and night time         OT Education - 05/09/19 1203    Education Details  HEP changes    Person(s) Educated  Patient    Methods  Explanation;Demonstration;Tactile cues;Verbal cues;Handout    Comprehension  Verbal cues required;Returned demonstration;Verbalized understanding       OT Short Term Goals - 04/23/19 1625      OT SHORT TERM GOAL #1   Title  Pt to be independent in HEP to increase digits flexion AROM to grasp 2 cm cylinder objects    Baseline  MC's R 65-85, PIP's 75-85 degrees , L MC 50-60 and PIP's 60-75 - and decrease PIP extentoin at all digits -10 to -35    Time  3    Period  Weeks    Status  New    Target Date  05/14/19      OT SHORT TERM GOAL #2   Title  Bilateral wrist AROM improve in all planes by 10 degrees to ease dressing  and turning doorknob    Baseline  see flowsheet    Time  4    Period  Weeks    Status  New    Target Date  05/21/19        OT Long Term Goals - 04/23/19 1637      OT LONG TERM GOAL #1   Title  Digits flexion improve for pt to touch palm to hold golf club, cut food    Baseline  MC's R 65-85, PIP's 75-85 degrees , L MC 50-60 and PIP's 60-75 - and decrease PIP extentoin at all digits -10 to -35    Time  6    Period  Weeks    Status  New    Target Date  06/04/19      OT LONG TERM GOAL #2   Title  Grip strength increase in bilateral hands to in range for his age to carry more than 10 lbs without increase symptoms , grip tools    Baseline  NT - decrease AROM - pain and edema in L hand worse than R    Time  8    Period  Weeks    Status  New    Target Date  06/18/19      OT  LONG TERM GOAL #3   Title  Function score on PRWHE improve with more than 15 points to participate in sports, and fixing things around house    Baseline  eval PRWHE score for function was 23.5/50    Time  8    Period  Weeks    Status  New    Target Date  06/18/19      OT LONG TERM GOAL #4   Title  Wrist Strength increase for pt to push up from chair  , bath tub ,drive  and pull shirt over head    Baseline  wrist AROM in all planes decrease  with pain and cannot do resistance at end range    Time  8    Period  Weeks    Status  New    Target Date  06/18/19            Plan - 05/09/19 1205    Clinical Impression Statement  Pt show slow but steady progress in bilateral hand with R better than L - report using hands more and after last infusion -sleeping better and more energy - pt getting close to touching palm on R - L hand still limited by edema and pain    OT Occupational Profile and History  Problem Focused Assessment - Including review of records relating to presenting problem    Occupational performance deficits (Please refer to evaluation for details):  ADL's;IADL's;Rest and Sleep;Education;Leisure;Social Participation;Play    Body Structure / Function / Physical Skills  ADL;Flexibility;ROM;UE functional use;FMC;Dexterity;Edema;Pain;Strength;IADL;Coordination    Rehab Potential  Good    Clinical Decision Making  Limited treatment options, no task modification necessary    Comorbidities Affecting Occupational Performance:  May have comorbidities impacting occupational performance    Modification or Assistance to Complete Evaluation   No modification of tasks or assist necessary to complete eval    OT Frequency  2x / week    OT Duration  6 weeks    OT Treatment/Interventions  Self-care/ADL training;Therapeutic exercise;Patient/family education;Splinting;Paraffin;Fluidtherapy;Contrast Bath;DME and/or AE instruction;Manual Therapy;Passive range of motion    Plan  assess progress with HEP and adjust as needed    OT Home Exercise Plan  see pt instruction    Consulted and Agree with Plan of Care  Patient       Patient will benefit from skilled therapeutic intervention in order to improve the following deficits and impairments:   Body Structure / Function / Physical Skills: ADL, Flexibility, ROM, UE functional use, FMC, Dexterity,  Edema, Pain, Strength, IADL, Coordination       Visit Diagnosis: Stiffness of right hand, not elsewhere classified  Stiffness of left wrist, not elsewhere classified  Stiffness of right wrist, not elsewhere classified  Pain in left hand  Muscle weakness (generalized)  Stiffness of left hand, not elsewhere classified  Pain in right hand  Localized edema    Problem List Patient Active Problem List   Diagnosis Date Noted  . Seronegative arthritis 01/15/2019  . BPH (benign prostatic hyperplasia) 06/22/2018  . Giant cell arteritis (HCC) 02/13/2018  . Microcytic anemia 02/01/2018  . Diet-controlled diabetes mellitus (HCC)   . Hyperlipidemia   . Hypertension     Shane Duncan  OTR/L,CLT 05/09/2019, 12:40 PM  [place] [facility] PHYSICAL AND SPORTS MEDICINE [address], [place], [address] Phone: [phone]   Fax:  [phone]  Name: Shane Duncan MRN: [id number] Date of Birth: 1950/10/16

## 2019-05-09 NOTE — Patient Instructions (Signed)
Same HEP but focus on intrinsic fist R hand  Prior to composite  And composite for L hand  Composite flexion 4th and 5th to palm -  Wrist AAROM and PROM

## 2019-05-13 ENCOUNTER — Other Ambulatory Visit: Payer: Self-pay

## 2019-05-13 ENCOUNTER — Ambulatory Visit: Payer: Medicare Other | Admitting: Occupational Therapy

## 2019-05-13 DIAGNOSIS — M79642 Pain in left hand: Secondary | ICD-10-CM

## 2019-05-13 DIAGNOSIS — M25631 Stiffness of right wrist, not elsewhere classified: Secondary | ICD-10-CM | POA: Diagnosis not present

## 2019-05-13 DIAGNOSIS — M25632 Stiffness of left wrist, not elsewhere classified: Secondary | ICD-10-CM | POA: Diagnosis not present

## 2019-05-13 DIAGNOSIS — M25641 Stiffness of right hand, not elsewhere classified: Secondary | ICD-10-CM | POA: Diagnosis not present

## 2019-05-13 DIAGNOSIS — R600 Localized edema: Secondary | ICD-10-CM

## 2019-05-13 DIAGNOSIS — M25642 Stiffness of left hand, not elsewhere classified: Secondary | ICD-10-CM

## 2019-05-13 DIAGNOSIS — M79641 Pain in right hand: Secondary | ICD-10-CM

## 2019-05-13 DIAGNOSIS — M6281 Muscle weakness (generalized): Secondary | ICD-10-CM

## 2019-05-13 NOTE — Therapy (Signed)
[place] [facility] [address] [place], [address] Phone: [phone]   Fax:  [phone]  Occupational Therapy Treatment  Patient Details  Name: Shane Duncan MRN: [id number] Date of Birth: April 15, 1951 Referring Provider (OT): Olevia Perches   Encounter Date: 05/13/2019  OT End of Session - 05/13/19 1330    Visit Number  7    Number of Visits  16    Date for OT Re-Evaluation  06/18/19    OT Start Time  1303    OT Stop Time  1349    OT Time Calculation (min)  46 min    Activity Tolerance  Patient tolerated treatment well;Patient limited by pain    Behavior During Therapy  [facility] for tasks assessed/performed       Past Medical History:  Diagnosis Date  . Bronchitis   . Chronic sinusitis   . Esophagitis   . Hemorrhoids   . Hepatitis A   . Hyperlipidemia   . Hypertension   . IFG (impaired fasting glucose)   . Temporal arteritis (HCC) 01/2018    Past Surgical History:  Procedure Laterality Date  . ACHILLES TENDON SURGERY Right 1997  . ANUS SURGERY  1990's   anal fistula repair  . ARTERY BIOPSY Left 02/05/2018   Procedure: BIOPSY TEMPORAL ARTERY;  Surgeon: Annice Needy, MD;  Location: [facility] ORS;  Service: Vascular;  Laterality: Left;  . TONSILLECTOMY  1961    There were no vitals filed for this visit.  Subjective Assessment - 05/13/19 1414    Subjective   I did not had good day yesterday - fingers are stil stiff and sore - but slowly getting better -    Pertinent History  Per Dr Renard Matter note -Seronegative arthritisNegative CCP negative inflammatory arthritis, multiple joints neck shoulder knees ankles and handS. Synovitis in both hands+ wrists unable to make a SEMI-fist. NO psoriasis. BETTER with methotrexate initiated in November 30, 2018, REMICADE (3.2 MG/KG SINCE 03/18/2019) - had 2nd dosis Dec 4th and again next week - need help - L hand worse than R , wrist also stiff - L hand swollen - and then pain too  with moving    Patient Stated Goals  I want to be able to make fist with both hands, to use my hands to play golf, fix things around the house, grip tools , work out , push up , wash hair, grip during bathing and dressing    Currently in Pain?  Yes    Pain Score  5     Pain Location  Hand    Pain Orientation  Right;Left    Pain Descriptors / Indicators  Aching    Pain Type  Chronic pain    Pain Onset  More than a month ago    Aggravating Factors   PROM for digits fleion         [facility] OT Assessment - 05/13/19 0001      Strength   Right Hand Grip (lbs)  31    Right Hand Lateral Pinch  21 lbs    Right Hand 3 Point Pinch  18 lbs    Left Hand Grip (lbs)  18    Left Hand Lateral Pinch  16 lbs    Left Hand 3 Point Pinch  11 lbs      Right Hand AROM   R Index  MCP 0-90  75 Degrees    R Index PIP 0-100  90 Degrees  R Long  MCP 0-90  80 Degrees    R Long PIP 0-100  90 Degrees    R Ring  MCP 0-90  75 Degrees    R Ring PIP 0-100  85 Degrees    R Little  MCP 0-90  90 Degrees    R Little PIP 0-100  90 Degrees      Left Hand AROM   L Index  MCP 0-90  60 Degrees    L Index PIP 0-100  80 Degrees    L Long  MCP 0-90  60 Degrees    L Long PIP 0-100  65 Degrees    L Ring  MCP 0-90  65 Degrees    L Ring PIP 0-100  80 Degrees    L Little  MCP 0-90  80 Degrees    L Little PIP 0-100  70 Degrees         Show some progress L hand more than R hand - limited by edema and pain more in L hand than R  Grip and prehension did increase since last time But pain 4/10 with gripping      OT Treatments/Exercises (OP) - 05/13/19 0001      RUE Paraffin   Number Minutes Paraffin  8 Minutes    RUE Paraffin Location  Hand    Comments  intrinsic fist stretch       LUE Paraffin   Number Minutes Paraffin  8 Minutes    LUE Paraffin Location  Hand    Comments  prior to knuckle bender        knuckle bender fitted on bilateral hands - and pt ed on wearing and donning  3-4 rubber bands use on R  hand and 2 band on ulnar side of L hand  2-3 min -and slight pull - less than 2/10 pain  And can do composite AROM and AAROM while in knuckle bender  And then do tendon glides    showed some increase MC flexion after using knuckle bender     OT Education - 05/13/19 1416    Education Details  HEP changes add knucke bender    Person(s) Educated  Patient    Methods  Explanation;Demonstration;Tactile cues;Verbal cues;Handout    Comprehension  Verbal cues required;Returned demonstration;Verbalized understanding       OT Short Term Goals - 04/23/19 1625      OT SHORT TERM GOAL #1   Title  Pt to be independent in HEP to increase digits flexion AROM to grasp 2 cm cylinder objects    Baseline  MC's R 65-85, PIP's 75-85 degrees , L MC 50-60 and PIP's 60-75 - and decrease PIP extentoin at all digits -10 to -35    Time  3    Period  Weeks    Status  New    Target Date  05/14/19      OT SHORT TERM GOAL #2   Title  Bilateral wrist AROM improve in all planes by 10 degrees to ease dressing  and turning doorknob    Baseline  see flowsheet    Time  4    Period  Weeks    Status  New    Target Date  05/21/19        OT Long Term Goals - 04/23/19 1637      OT LONG TERM GOAL #1   Title  Digits flexion improve for pt to touch palm to hold golf club, cut food  Baseline  MC's R 65-85, PIP's 75-85 degrees , L MC 50-60 and PIP's 60-75 - and decrease PIP extentoin at all digits -10 to -35    Time  6    Period  Weeks    Status  New    Target Date  06/04/19      OT LONG TERM GOAL #2   Title  Grip strength increase in bilateral hands to in range for his age to carry more than 10 lbs without increase symptoms , grip tools    Baseline  NT - decrease AROM - pain and edema in L hand worse than R    Time  8    Period  Weeks    Status  New    Target Date  06/18/19      OT LONG TERM GOAL #3   Title  Function score on PRWHE improve with more than 15 points to participate in sports, and fixing  things around house    Baseline  eval PRWHE score for function was 23.5/50    Time  8    Period  Weeks    Status  New    Target Date  06/18/19      OT LONG TERM GOAL #4   Title  Wrist Strength increase for pt to push up from chair , bath tub ,drive  and pull shirt over head    Baseline  wrist AROM in all planes decrease  with pain and cannot do resistance at end range    Time  8    Period  Weeks    Status  New    Target Date  06/18/19            Plan - 05/13/19 1330    Clinical Impression Statement  Pt cont to show slow but steady progress in digits flexion and grip/prehension- did add this date knuckle bender splint to use for increase MC flexion with composite - but to keep pain under 2/10 pain - to keep edema and pain under control    OT Occupational Profile and History  Problem Focused Assessment - Including review of records relating to presenting problem    Occupational performance deficits (Please refer to evaluation for details):  ADL's;IADL's;Rest and Sleep;Education;Leisure;Social Participation;Play    Body Structure / Function / Physical Skills  ADL;Flexibility;ROM;UE functional use;FMC;Dexterity;Edema;Pain;Strength;IADL;Coordination    Rehab Potential  Good    Clinical Decision Making  Limited treatment options, no task modification necessary    Comorbidities Affecting Occupational Performance:  May have comorbidities impacting occupational performance    Modification or Assistance to Complete Evaluation   No modification of tasks or assist necessary to complete eval    OT Frequency  2x / week    OT Duration  6 weeks    OT Treatment/Interventions  Self-care/ADL training;Therapeutic exercise;Patient/family education;Splinting;Paraffin;Fluidtherapy;Contrast Bath;DME and/or AE instruction;Manual Therapy;Passive range of motion    Plan  assess progress with HEP and adjust as needed    OT Home Exercise Plan  see pt instruction    Consulted and Agree with Plan of Care   Patient       Patient will benefit from skilled therapeutic intervention in order to improve the following deficits and impairments:   Body Structure / Function / Physical Skills: ADL, Flexibility, ROM, UE functional use, FMC, Dexterity, Edema, Pain, Strength, IADL, Coordination       Visit Diagnosis: Stiffness of right hand, not elsewhere classified  Stiffness of left wrist, not elsewhere classified  Stiffness of  right wrist, not elsewhere classified  Pain in left hand  Muscle weakness (generalized)  Stiffness of left hand, not elsewhere classified  Localized edema  Pain in right hand    Problem List Patient Active Problem List   Diagnosis Date Noted  . Seronegative arthritis 01/15/2019  . BPH (benign prostatic hyperplasia) 06/22/2018  . Giant cell arteritis (HCC) 02/13/2018  . Microcytic anemia 02/01/2018  . Diet-controlled diabetes mellitus (HCC)   . Hyperlipidemia   . Hypertension     Oletta Cohn OTR/l,CLT 05/13/2019, 3:45 PM  [place] [facility] PHYSICAL AND SPORTS MEDICINE [address] [place], [address] Phone: [phone]   Fax:  [phone]  Name: Shane Duncan MRN: [id number] Date of Birth: 1950/10/12

## 2019-05-13 NOTE — Patient Instructions (Signed)
After contrast and massage  Add knuckle bender to bilateral hands - to use for MC flexion stretch - 2-3 min  Slight pull less than 2/10- pain  4 and 3 bands on R hand and 2 on ulnar side of L hand  Prior to [facility] and PROM

## 2019-05-15 ENCOUNTER — Ambulatory Visit: Payer: Medicare Other | Admitting: Occupational Therapy

## 2019-05-20 DIAGNOSIS — Z6828 Body mass index (BMI) 28.0-28.9, adult: Secondary | ICD-10-CM | POA: Diagnosis not present

## 2019-05-20 DIAGNOSIS — J322 Chronic ethmoidal sinusitis: Secondary | ICD-10-CM | POA: Diagnosis not present

## 2019-05-20 DIAGNOSIS — M180 Bilateral primary osteoarthritis of first carpometacarpal joints: Secondary | ICD-10-CM | POA: Diagnosis not present

## 2019-05-20 DIAGNOSIS — M064 Inflammatory polyarthropathy: Secondary | ICD-10-CM | POA: Diagnosis not present

## 2019-05-20 DIAGNOSIS — J321 Chronic frontal sinusitis: Secondary | ICD-10-CM | POA: Diagnosis not present

## 2019-05-20 DIAGNOSIS — M85842 Other specified disorders of bone density and structure, left hand: Secondary | ICD-10-CM | POA: Diagnosis not present

## 2019-05-20 DIAGNOSIS — M19072 Primary osteoarthritis, left ankle and foot: Secondary | ICD-10-CM | POA: Diagnosis not present

## 2019-05-20 DIAGNOSIS — Z880 Allergy status to penicillin: Secondary | ICD-10-CM | POA: Diagnosis not present

## 2019-05-20 DIAGNOSIS — Z8379 Family history of other diseases of the digestive system: Secondary | ICD-10-CM | POA: Diagnosis not present

## 2019-05-20 DIAGNOSIS — M222X2 Patellofemoral disorders, left knee: Secondary | ICD-10-CM | POA: Diagnosis not present

## 2019-05-20 DIAGNOSIS — M19042 Primary osteoarthritis, left hand: Secondary | ICD-10-CM | POA: Diagnosis not present

## 2019-05-20 DIAGNOSIS — M1990 Unspecified osteoarthritis, unspecified site: Secondary | ICD-10-CM | POA: Diagnosis not present

## 2019-05-20 DIAGNOSIS — M222X1 Patellofemoral disorders, right knee: Secondary | ICD-10-CM | POA: Diagnosis not present

## 2019-05-20 DIAGNOSIS — M85841 Other specified disorders of bone density and structure, right hand: Secondary | ICD-10-CM | POA: Diagnosis not present

## 2019-05-20 DIAGNOSIS — M19071 Primary osteoarthritis, right ankle and foot: Secondary | ICD-10-CM | POA: Diagnosis not present

## 2019-05-20 DIAGNOSIS — Z9225 Personal history of immunosupression therapy: Secondary | ICD-10-CM | POA: Diagnosis not present

## 2019-05-20 DIAGNOSIS — M7918 Myalgia, other site: Secondary | ICD-10-CM | POA: Diagnosis not present

## 2019-05-20 DIAGNOSIS — R682 Dry mouth, unspecified: Secondary | ICD-10-CM | POA: Diagnosis not present

## 2019-05-20 DIAGNOSIS — M316 Other giant cell arteritis: Secondary | ICD-10-CM | POA: Diagnosis not present

## 2019-05-20 DIAGNOSIS — Z7409 Other reduced mobility: Secondary | ICD-10-CM | POA: Diagnosis not present

## 2019-05-20 DIAGNOSIS — L409 Psoriasis, unspecified: Secondary | ICD-10-CM | POA: Diagnosis not present

## 2019-05-20 DIAGNOSIS — J9811 Atelectasis: Secondary | ICD-10-CM | POA: Diagnosis not present

## 2019-05-20 DIAGNOSIS — H04123 Dry eye syndrome of bilateral lacrimal glands: Secondary | ICD-10-CM | POA: Diagnosis not present

## 2019-05-20 DIAGNOSIS — Z79891 Long term (current) use of opiate analgesic: Secondary | ICD-10-CM | POA: Diagnosis not present

## 2019-05-20 DIAGNOSIS — M19041 Primary osteoarthritis, right hand: Secondary | ICD-10-CM | POA: Diagnosis not present

## 2019-05-20 DIAGNOSIS — R519 Headache, unspecified: Secondary | ICD-10-CM | POA: Diagnosis not present

## 2019-05-21 DIAGNOSIS — H538 Other visual disturbances: Secondary | ICD-10-CM | POA: Diagnosis not present

## 2019-05-21 DIAGNOSIS — G932 Benign intracranial hypertension: Secondary | ICD-10-CM | POA: Diagnosis not present

## 2019-05-21 DIAGNOSIS — M1990 Unspecified osteoarthritis, unspecified site: Secondary | ICD-10-CM | POA: Diagnosis not present

## 2019-05-22 ENCOUNTER — Ambulatory Visit: Payer: Medicare Other | Attending: Family Medicine | Admitting: Occupational Therapy

## 2019-05-22 ENCOUNTER — Other Ambulatory Visit: Payer: Self-pay

## 2019-05-22 DIAGNOSIS — R600 Localized edema: Secondary | ICD-10-CM | POA: Insufficient documentation

## 2019-05-22 DIAGNOSIS — M25641 Stiffness of right hand, not elsewhere classified: Secondary | ICD-10-CM | POA: Diagnosis not present

## 2019-05-22 DIAGNOSIS — M79642 Pain in left hand: Secondary | ICD-10-CM | POA: Diagnosis not present

## 2019-05-22 DIAGNOSIS — M79641 Pain in right hand: Secondary | ICD-10-CM | POA: Diagnosis not present

## 2019-05-22 DIAGNOSIS — M25632 Stiffness of left wrist, not elsewhere classified: Secondary | ICD-10-CM | POA: Diagnosis not present

## 2019-05-22 DIAGNOSIS — M25631 Stiffness of right wrist, not elsewhere classified: Secondary | ICD-10-CM | POA: Diagnosis not present

## 2019-05-22 DIAGNOSIS — M25642 Stiffness of left hand, not elsewhere classified: Secondary | ICD-10-CM | POA: Insufficient documentation

## 2019-05-22 DIAGNOSIS — M6281 Muscle weakness (generalized): Secondary | ICD-10-CM

## 2019-05-22 NOTE — Patient Instructions (Signed)
same

## 2019-05-22 NOTE — Therapy (Signed)
[place] [facility]ORTS MEDICINE [address], [place], [address] Phone: [phone]   Fax:  [phone]  Occupational Therapy Treatment  Patient Details  Name: Shane Duncan MRN: [id number] Date of Birth: Jul 11, 1950 Referring Provider (OT): Olevia Perches   Encounter Date: 05/22/2019  OT End of Session - 05/22/19 1732    Visit Number  8    Number of Visits  16    Date for OT Re-Evaluation  06/18/19    OT Start Time  1300    OT Stop Time  1348    OT Time Calculation (min)  48 min    Activity Tolerance  Patient tolerated treatment well;Patient limited by pain    Behavior During Therapy  [facility] for tasks assessed/performed       Past Medical History:  Diagnosis Date  . Bronchitis   . Chronic sinusitis   . Esophagitis   . Hemorrhoids   . Hepatitis A   . Hyperlipidemia   . Hypertension   . IFG (impaired fasting glucose)   . Temporal arteritis (HCC) 01/2018    Past Surgical History:  Procedure Laterality Date  . ACHILLES TENDON SURGERY Right 1997  . ANUS SURGERY  1990's   anal fistula repair  . ARTERY BIOPSY Left 02/05/2018   Procedure: BIOPSY TEMPORAL ARTERY;  Surgeon: Annice Needy, MD;  Location: [facility] ORS;  Service: Vascular;  Laterality: Left;  . TONSILLECTOMY  1961    There were no vitals filed for this visit.  Subjective Assessment - 05/22/19 1319    Subjective   I seen a new rheumatholog - she spend a lot of time wiht me - put me back on prednisone 60 mg, 2wks , then 50 and then 40 adn follow April 14-    Pertinent History  Per Dr Renard Matter note -Seronegative arthritisNegative CCP negative inflammatory arthritis, multiple joints neck shoulder knees ankles and handS. Synovitis in both hands+ wrists unable to make a SEMI-fist. NO psoriasis. BETTER with methotrexate initiated in November 30, 2018, REMICADE (3.2 MG/KG SINCE 03/18/2019) - had 2nd dosis Dec 4th and again next week - need help - L hand worse than R , wrist also  stiff - L hand swollen - and then pain too with moving    Patient Stated Goals  I want to be able to make fist with both hands, to use my hands to play golf, fix things around the house, grip tools , work out , push up , wash hair, grip during bathing and dressing    Currently in Pain?  Yes    Pain Score  4     Pain Location  Hand    Pain Orientation  Right;Left    Pain Descriptors / Indicators  Aching    Pain Type  Chronic pain    Pain Onset  More than a month ago    Pain Frequency  Constant         [facility] OT Assessment - 05/22/19 0001      Strength   Right Hand Grip (lbs)  20    Left Hand Grip (lbs)  18      Right Hand AROM   R Index  MCP 0-90  75 Degrees    R Long  MCP 0-90  80 Degrees    R Ring  MCP 0-90  75 Degrees    R Little  MCP 0-90  90 Degrees      Left Hand AROM   L  Index  MCP 0-90  60 Degrees    L Long  MCP 0-90  70 Degrees    L Ring  MCP 0-90  70 Degrees    L Little  MCP 0-90  80 Degrees      Pt do show some progress in AROM R hand more than L - still more edema and pain in L hand  Pt did see new Rheumatologist and started on prednisone  Hope that will help with pain and edema          OT Treatments/Exercises (OP) - 05/22/19 0001      RUE Paraffin   Number Minutes Paraffin  8 Minutes    RUE Paraffin Location  Hand    Comments  intrinsic fist and MC AAROM       LUE Paraffin   Number Minutes Paraffin  8 Minutes    LUE Paraffin Location  Hand    Comments  intrinsic fist / MC flexion      done some graston tool nr 2 sweeping on volar forearm and wrist and volar digits prior to ROM    knuckle bender fitted on bilateral hands and pt able to tolerate R hand better than L - about 4 min each and ten AAROM on R more than L - and pt ed on wearing and donning again   3-4 rubber bands use on R hand and 2 band on ulnar side of L hand  But could do 3 bands on ulnar side of L hand this date  Done some  composite AROM and AAROM while in knuckle bender  And  then do tendon glides   showed some increase MC flexion after using knuckle bender        OT Education - 05/22/19 1732    Education Details  progress and HEP    Person(s) Educated  Patient    Methods  Explanation;Demonstration;Tactile cues;Verbal cues;Handout    Comprehension  Verbal cues required;Returned demonstration;Verbalized understanding       OT Short Term Goals - 04/23/19 1625      OT SHORT TERM GOAL #1   Title  Pt to be independent in HEP to increase digits flexion AROM to grasp 2 cm cylinder objects    Baseline  MC's R 65-85, PIP's 75-85 degrees , L MC 50-60 and PIP's 60-75 - and decrease PIP extentoin at all digits -10 to -35    Time  3    Period  Weeks    Status  New    Target Date  05/14/19      OT SHORT TERM GOAL #2   Title  Bilateral wrist AROM improve in all planes by 10 degrees to ease dressing  and turning doorknob    Baseline  see flowsheet    Time  4    Period  Weeks    Status  New    Target Date  05/21/19        OT Long Term Goals - 04/23/19 1637      OT LONG TERM GOAL #1   Title  Digits flexion improve for pt to touch palm to hold golf club, cut food    Baseline  MC's R 65-85, PIP's 75-85 degrees , L MC 50-60 and PIP's 60-75 - and decrease PIP extentoin at all digits -10 to -35    Time  6    Period  Weeks    Status  New    Target Date  06/04/19  OT LONG TERM GOAL #2   Title  Grip strength increase in bilateral hands to in range for his age to carry more than 10 lbs without increase symptoms , grip tools    Baseline  NT - decrease AROM - pain and edema in L hand worse than R    Time  8    Period  Weeks    Status  New    Target Date  06/18/19      OT LONG TERM GOAL #3   Title  Function score on PRWHE improve with more than 15 points to participate in sports, and fixing things around house    Baseline  eval PRWHE score for function was 23.5/50    Time  8    Period  Weeks    Status  New    Target Date  06/18/19      OT LONG TERM  GOAL #4   Title  Wrist Strength increase for pt to push up from chair , bath tub ,drive  and pull shirt over head    Baseline  wrist AROM in all planes decrease  with pain and cannot do resistance at end range    Time  8    Period  Weeks    Status  New    Target Date  06/18/19            Plan - 05/22/19 1733    Clinical Impression Statement  Pt seen another Rheumathologist for 2nd opinion and started him on 60mg  prednisone -and tampering it off every 2 wks 20 mg - pt cont to progress slow but steady - R hand more than L hand - pt to cont with knucklebender for MC flexion -and then composite - hoping the steriod will help for pain and edema    OT Occupational Profile and History  Problem Focused Assessment - Including review of records relating to presenting problem    Occupational performance deficits (Please refer to evaluation for details):  ADL's;IADL's;Rest and Sleep;Education;Leisure;Social Participation;Play    Body Structure / Function / Physical Skills  ADL;Flexibility;ROM;UE functional use;FMC;Dexterity;Edema;Pain;Strength;IADL;Coordination    Rehab Potential  Good    Clinical Decision Making  Limited treatment options, no task modification necessary    Comorbidities Affecting Occupational Performance:  May have comorbidities impacting occupational performance    Modification or Assistance to Complete Evaluation   No modification of tasks or assist necessary to complete eval    OT Frequency  2x / week    OT Duration  6 weeks    OT Treatment/Interventions  Self-care/ADL training;Therapeutic exercise;Patient/family education;Splinting;Paraffin;Fluidtherapy;Contrast Bath;DME and/or AE instruction;Manual Therapy;Passive range of motion    Plan  assess progress with HEP and adjust as needed    OT Home Exercise Plan  see pt instruction    Consulted and Agree with Plan of Care  Patient       Patient will benefit from skilled therapeutic intervention in order to improve the  following deficits and impairments:   Body Structure / Function / Physical Skills: ADL, Flexibility, ROM, UE functional use, FMC, Dexterity, Edema, Pain, Strength, IADL, Coordination       Visit Diagnosis: Stiffness of right hand, not elsewhere classified  Stiffness of left wrist, not elsewhere classified  Pain in left hand  Muscle weakness (generalized)  Stiffness of left hand, not elsewhere classified  Stiffness of right wrist, not elsewhere classified  Pain in right hand  Localized edema    Problem List Patient Active Problem List  Diagnosis Date Noted  . Seronegative arthritis 01/15/2019  . BPH (benign prostatic hyperplasia) 06/22/2018  . Giant cell arteritis (HCC) 02/13/2018  . Microcytic anemia 02/01/2018  . Diet-controlled diabetes mellitus (HCC)   . Hyperlipidemia   . Hypertension     Oletta Cohn OTR/L,CLT 05/22/2019, 5:36 PM  [place] [facility] PHYSICAL AND SPORTS MEDICINE [address] [place], [address] Phone: [phone]   Fax:  [phone]  Name: TKAI SERFASS MRN: [id number] Date of Birth: 1950-09-15

## 2019-05-27 ENCOUNTER — Other Ambulatory Visit: Payer: Self-pay

## 2019-05-27 ENCOUNTER — Ambulatory Visit: Payer: Medicare Other | Admitting: Occupational Therapy

## 2019-05-27 DIAGNOSIS — M79642 Pain in left hand: Secondary | ICD-10-CM | POA: Diagnosis not present

## 2019-05-27 DIAGNOSIS — M6281 Muscle weakness (generalized): Secondary | ICD-10-CM | POA: Diagnosis not present

## 2019-05-27 DIAGNOSIS — M25642 Stiffness of left hand, not elsewhere classified: Secondary | ICD-10-CM | POA: Diagnosis not present

## 2019-05-27 DIAGNOSIS — M25631 Stiffness of right wrist, not elsewhere classified: Secondary | ICD-10-CM | POA: Diagnosis not present

## 2019-05-27 DIAGNOSIS — M25641 Stiffness of right hand, not elsewhere classified: Secondary | ICD-10-CM

## 2019-05-27 DIAGNOSIS — M25632 Stiffness of left wrist, not elsewhere classified: Secondary | ICD-10-CM | POA: Diagnosis not present

## 2019-05-27 DIAGNOSIS — M79641 Pain in right hand: Secondary | ICD-10-CM

## 2019-05-27 DIAGNOSIS — R600 Localized edema: Secondary | ICD-10-CM

## 2019-05-27 NOTE — Therapy (Signed)
[facility] [address], [place], [address] Phone: [phone]   Fax:  [phone]  Occupational Therapy Treatment  Patient Details  Name: Shane Duncan MRN: [id number] Date of Birth: August 09, 1950 Referring Provider (OT): Park Liter   Encounter Date: 05/27/2019  OT End of Session - 05/27/19 1745    Visit Number  9    Number of Visits  16    Date for OT Re-Evaluation  06/18/19    OT Start Time  1600    OT Stop Time  1637    OT Time Calculation (min)  37 min    Activity Tolerance  Patient tolerated treatment well;Patient limited by pain    Behavior During Therapy  [facility] for tasks assessed/performed       Past Medical History:  Diagnosis Date  . Bronchitis   . Chronic sinusitis   . Esophagitis   . Hemorrhoids   . Hepatitis A   . Hyperlipidemia   . Hypertension   . IFG (impaired fasting glucose)   . Temporal arteritis ([place]) 01/2018    Past Surgical History:  Procedure Laterality Date  . ACHILLES TENDON SURGERY Right 1997  . ANUS SURGERY  1990's   anal fistula repair  . ARTERY BIOPSY Left 02/05/2018   Procedure: BIOPSY TEMPORAL ARTERY;  Surgeon: Algernon Huxley, MD;  Location: [facility] ORS;  Service: Vascular;  Laterality: Left;  . TONSILLECTOMY  1961    There were no vitals filed for this visit.  Subjective Assessment - 05/27/19 1618    Subjective   I am still on the 60 mg prednisone - feeling good - look at my shoulder ROM - done the pulleys - and hands feels little better - maybe not as swollen    Pertinent History  Per Dr Meda Coffee note -Seronegative arthritisNegative CCP negative inflammatory arthritis, multiple joints neck shoulder knees ankles and handS. Synovitis in both hands+ wrists unable to make a SEMI-fist. NO psoriasis. BETTER with methotrexate initiated in November 30, 2018, REMICADE (3.2 MG/KG SINCE 03/18/2019) - had 2nd dosis Dec 4th and again next week - need help - L hand worse than R ,  wrist also stiff - L hand swollen - and then pain too with moving    Patient Stated Goals  I want to be able to make fist with both hands, to use my hands to play golf, fix things around the house, grip tools , work out , push up , wash hair, grip during bathing and dressing    Pain Score  2     Pain Location  Hand    Pain Orientation  Right;Left    Pain Descriptors / Indicators  Aching;Tightness    Pain Type  Chronic pain    Pain Onset  More than a month ago    Aggravating Factors   PROM - stretches         [facility] OT Assessment - 05/27/19 0001      AROM   Right Wrist Extension  50 Degrees    Right Wrist Flexion  75 Degrees    Left Wrist Extension  52 Degrees    Left Wrist Flexion  62 Degrees      Strength   Right Hand Grip (lbs)  36    Left Hand Grip (lbs)  26    Left Hand 3 Point Pinch  15 lbs      AROM assess in wrist , grip and prehension assess -progress very  well - see flow sheet  screen digits flexion -coming in about same -but had decrease edema and pain in L hand - was able to do more PROM this date          OT Treatments/Exercises (OP) - 05/27/19 0001      RUE Paraffin   Number Minutes Paraffin  8 Minutes    RUE Paraffin Location  Hand    Comments  prior to soft tissue and ROM       LUE Paraffin   Number Minutes Paraffin  8 Minutes    LUE Paraffin Location  Hand    Comments  prior to soft tissue and ROM         done some graston tool nr 2 sweeping on volar forearm and wrist and volar digits prior to ROM  And MC and CT spreads by OT with gentle traction to digits    knuckle bender on L hand this date 4 min  And AROM to bar - while OT do manual and ROM in L hand  Able to do 1 band on Radial side and 4 on ulnar side this date on L hand    3-4 rubber bands use on R hand at home   Done some  composite AROM and AAROM fto MC and digits to palm and 2 cm foam block  Gripping into teal putty 12 reps bilateral hands this date -with good success and no pain   - to do at home    Showed increase MC flexion end of session       OT Education - 05/27/19 1745    Education Details  progress and HEP changes    Person(s) Educated  Patient    Methods  Explanation;Demonstration;Tactile cues;Verbal cues;Handout    Comprehension  Verbal cues required;Returned demonstration;Verbalized understanding       OT Short Term Goals - 04/23/19 1625      OT SHORT TERM GOAL #1   Title  Pt to be independent in HEP to increase digits flexion AROM to grasp 2 cm cylinder objects    Baseline  MC's R 65-85, PIP's 75-85 degrees , L MC 50-60 and PIP's 60-75 - and decrease PIP extentoin at all digits -10 to -35    Time  3    Period  Weeks    Status  New    Target Date  05/14/19      OT SHORT TERM GOAL #2   Title  Bilateral wrist AROM improve in all planes by 10 degrees to ease dressing  and turning doorknob    Baseline  see flowsheet    Time  4    Period  Weeks    Status  New    Target Date  05/21/19        OT Long Term Goals - 04/23/19 1637      OT LONG TERM GOAL #1   Title  Digits flexion improve for pt to touch palm to hold golf club, cut food    Baseline  MC's R 65-85, PIP's 75-85 degrees , L MC 50-60 and PIP's 60-75 - and decrease PIP extentoin at all digits -10 to -35    Time  6    Period  Weeks    Status  New    Target Date  06/04/19      OT LONG TERM GOAL #2   Title  Grip strength increase in bilateral hands to in range for his age to carry more than 10  lbs without increase symptoms , grip tools    Baseline  NT - decrease AROM - pain and edema in L hand worse than R    Time  8    Period  Weeks    Status  New    Target Date  06/18/19      OT LONG TERM GOAL #3   Title  Function score on PRWHE improve with more than 15 points to participate in sports, and fixing things around house    Baseline  eval PRWHE score for function was 23.5/50    Time  8    Period  Weeks    Status  New    Target Date  06/18/19      OT LONG TERM GOAL #4   Title   Wrist Strength increase for pt to push up from chair , bath tub ,drive  and pull shirt over head    Baseline  wrist AROM in all planes decrease  with pain and cannot do resistance at end range    Time  8    Period  Weeks    Status  New    Target Date  06/18/19            Plan - 05/27/19 1746    Clinical Impression Statement  Pt showing some decrease inflammation and pain in hands , shoulders this date being on the 60 mg prednisone - show increase grip and 3 point strength , increase wrist extention and able to tolerate more PROM /stretches on L hand this date    OT Occupational Profile and History  Problem Focused Assessment - Including review of records relating to presenting problem    Occupational performance deficits (Please refer to evaluation for details):  ADL's;IADL's;Rest and Sleep;Education;Leisure;Social Participation;Play    Body Structure / Function / Physical Skills  ADL;Flexibility;ROM;UE functional use;FMC;Dexterity;Edema;Pain;Strength;IADL;Coordination    Rehab Potential  Good    Clinical Decision Making  Limited treatment options, no task modification necessary    Comorbidities Affecting Occupational Performance:  May have comorbidities impacting occupational performance    Modification or Assistance to Complete Evaluation   No modification of tasks or assist necessary to complete eval    OT Frequency  2x / week    OT Duration  4 weeks    OT Treatment/Interventions  Self-care/ADL training;Therapeutic exercise;Patient/family education;Splinting;Paraffin;Fluidtherapy;Contrast Bath;DME and/or AE instruction;Manual Therapy;Passive range of motion    Plan  assess progress with HEP and adjust as needed    OT Home Exercise Plan  see pt instruction    Consulted and Agree with Plan of Care  Patient       Patient will benefit from skilled therapeutic intervention in order to improve the following deficits and impairments:   Body Structure / Function / Physical Skills: ADL,  Flexibility, ROM, UE functional use, FMC, Dexterity, Edema, Pain, Strength, IADL, Coordination       Visit Diagnosis: Stiffness of right hand, not elsewhere classified  Stiffness of left wrist, not elsewhere classified  Pain in left hand  Muscle weakness (generalized)  Stiffness of left hand, not elsewhere classified  Stiffness of right wrist, not elsewhere classified  Localized edema  Pain in right hand    Problem List Patient Active Problem List   Diagnosis Date Noted  . Seronegative arthritis 01/15/2019  . BPH (benign prostatic hyperplasia) 06/22/2018  . Giant cell arteritis ([facility]) 02/13/2018  . Microcytic anemia 02/01/2018  . Diet-controlled diabetes mellitus ([facility])   . Hyperlipidemia   . Hypertension  Oletta Cohn OTR/L,CLT 05/27/2019, 5:48 PM  [facility] [address], [place], [address] Phone: [phone]   Fax:  [phone]  Name: Shane Duncan MRN: [id number] Date of Birth: 1951/02/28

## 2019-05-27 NOTE — Patient Instructions (Signed)
Cont with sit<> stand  Pulleys for shoulders  Wrist extention prayer stretches  Knuckle bender on bilateral hands - increase rubberbands on L to tolerance  PROM and AROM for tendon glides  Teal putty for gripping

## 2019-05-30 ENCOUNTER — Ambulatory Visit (INDEPENDENT_AMBULATORY_CARE_PROVIDER_SITE_OTHER): Payer: Medicare Other | Admitting: Family Medicine

## 2019-05-30 ENCOUNTER — Encounter: Payer: Self-pay | Admitting: Family Medicine

## 2019-05-30 ENCOUNTER — Other Ambulatory Visit: Payer: Self-pay

## 2019-05-30 DIAGNOSIS — M1380 Other specified arthritis, unspecified site: Secondary | ICD-10-CM | POA: Diagnosis not present

## 2019-05-30 MED ORDER — TRAMADOL HCL 50 MG PO TABS: 50.0000 mg | ORAL_TABLET | Freq: Two times a day (BID) | ORAL | 2 refills | Status: DC

## 2019-05-30 NOTE — Progress Notes (Signed)
BP (!) 143/87 (BP Location: Left Arm, Patient Position: Sitting, Cuff Size: Normal)   Pulse 66   Temp 97.6 F (36.4 C) (Oral)   SpO2 99%    Subjective:    Patient ID: Shane Duncan, male    DOB: [birth year], 69 y.o.   MRN: [id number]  HPI: Shane Duncan is a 69 y.o. male  Chief Complaint  Patient presents with  . Pain   OT has been helping a lot. Back on the prednisone for the arthritis. Seeing a different doctor at [facility] going to start another medication. Prednisone is helping a lot.   CHRONIC PAIN- when his prednisone kicks in he doesn't really need it. He has been doing well with it.   Present dose:  10 Morphine equivalents Pain control status: controlled Duration: chronic Location: hands  Quality: aching and sore Current Pain Level: mild Previous Pain Level: severe Breakthrough pain: no Benefit from narcotic medications: yes What Activities task can be accomplished with current medication? Able to do his ADLs Interested in weaning off narcotics:yes   Stool softners/OTC fiber: no  Previous pain specialty evaluation: no Non-narcotic analgesic meds: yes Narcotic contract: yes  Relevant past medical, surgical, family and social history reviewed and updated as indicated. Interim medical history since our last visit reviewed. Allergies and medications reviewed and updated.  Review of Systems  Constitutional: Negative.   Respiratory: Negative.   Cardiovascular: Negative.   Musculoskeletal: Positive for arthralgias. Negative for back pain, gait problem, joint swelling, myalgias, neck pain and neck stiffness.  Skin: Negative.   Psychiatric/Behavioral: Negative.     Per HPI unless specifically indicated above     Objective:    BP (!) 143/87 (BP Location: Left Arm, Patient Position: Sitting, Cuff Size: Normal)   Pulse 66   Temp 97.6 F (36.4 C) (Oral)   SpO2 99%   Wt Readings from Last 3 Encounters:  03/28/19 202 lb 4 oz (91.7 kg)  01/24/19 211 lb (95.7 kg)    01/21/19 210 lb (95.3 kg)    Physical Exam Vitals and nursing note reviewed.  Constitutional:      General: He is not in acute distress.    Appearance: Normal appearance. He is not ill-appearing, toxic-appearing or diaphoretic.  HENT:     Head: Normocephalic and atraumatic.     Right Ear: External ear normal.     Left Ear: External ear normal.     Nose: Nose normal.     Mouth/Throat:     Mouth: Mucous membranes are moist.     Pharynx: Oropharynx is clear.  Eyes:     General: No scleral icterus.       Right eye: No discharge.        Left eye: No discharge.     Extraocular Movements: Extraocular movements intact.     Conjunctiva/sclera: Conjunctivae normal.     Pupils: Pupils are equal, round, and reactive to light.  Cardiovascular:     Rate and Rhythm: Normal rate and regular rhythm.     Pulses: Normal pulses.     Heart sounds: Normal heart sounds. No murmur. No friction rub. No gallop.   Pulmonary:     Effort: Pulmonary effort is normal. No respiratory distress.     Breath sounds: Normal breath sounds. No stridor. No wheezing, rhonchi or rales.  Chest:     Chest wall: No tenderness.  Musculoskeletal:        General: Normal range of motion.     Cervical back:  Normal range of motion and neck supple.  Skin:    General: Skin is warm and dry.     Capillary Refill: Capillary refill takes less than 2 seconds.     Coloration: Skin is not jaundiced or pale.     Findings: No bruising, erythema, lesion or rash.  Neurological:     General: No focal deficit present.     Mental Status: He is alert and oriented to person, place, and time. Mental status is at baseline.  Psychiatric:        Mood and Affect: Mood normal.        Behavior: Behavior normal.        Thought Content: Thought content normal.        Judgment: Judgment normal.     Results for orders placed or performed in visit on 03/28/19  Bayer DCA Hb A1c Waived  Result Value Ref Range   HB A1C (BAYER DCA - WAIVED) 5.6  <7.0 %  CBC with Differential OUT  Result Value Ref Range   WBC 5.8 3.4 - 10.8 x10E3/uL   RBC 4.92 4.14 - 5.80 x10E6/uL   Hemoglobin 13.1 13.0 - 17.7 g/dL   Hematocrit 40.2 37.5 - 51.0 %   MCV 82 79 - 97 fL   MCH 26.6 26.6 - 33.0 pg   MCHC 32.6 31.5 - 35.7 g/dL   RDW 14.5 11.6 - 15.4 %   Platelets 362 150 - 450 x10E3/uL   Neutrophils 63 Not Estab. %   Lymphs 21 Not Estab. %   Monocytes 10 Not Estab. %   Eos 5 Not Estab. %   Basos 1 Not Estab. %   Neutrophils Absolute 3.7 1.4 - 7.0 x10E3/uL   Lymphocytes Absolute 1.2 0.7 - 3.1 x10E3/uL   Monocytes Absolute 0.6 0.1 - 0.9 x10E3/uL   EOS (ABSOLUTE) 0.3 0.0 - 0.4 x10E3/uL   Basophils Absolute 0.0 0.0 - 0.2 x10E3/uL   Immature Granulocytes 0 Not Estab. %   Immature Grans (Abs) 0.0 0.0 - 0.1 x10E3/uL  Comp Met (CMET)  Result Value Ref Range   Glucose 107 (H) 65 - 99 mg/dL   BUN 10 8 - 27 mg/dL   Creatinine, Ser 0.78 0.76 - 1.27 mg/dL   GFR calc non Af Amer 94 >59 mL/min/1.73   GFR calc Af Amer 109 >59 mL/min/1.73   BUN/Creatinine Ratio 13 10 - 24   Sodium 139 134 - 144 mmol/L   Potassium 4.2 3.5 - 5.2 mmol/L   Chloride 103 96 - 106 mmol/L   CO2 19 (L) 20 - 29 mmol/L   Calcium 9.5 8.6 - 10.2 mg/dL   Total Protein 6.6 6.0 - 8.5 g/dL   Albumin 4.1 3.8 - 4.8 g/dL   Globulin, Total 2.5 1.5 - 4.5 g/dL   Albumin/Globulin Ratio 1.6 1.2 - 2.2   Bilirubin Total 0.4 0.0 - 1.2 mg/dL   Alkaline Phosphatase 99 39 - 117 IU/L   AST 18 0 - 40 IU/L   ALT 8 0 - 44 IU/L  Lipid Panel w/o Chol/HDL Ratio OUT  Result Value Ref Range   Cholesterol, Total 192 100 - 199 mg/dL   Triglycerides 114 0 - 149 mg/dL   HDL 41 >39 mg/dL   VLDL Cholesterol Cal 21 5 - 40 mg/dL   LDL Chol Calc (NIH) 130 (H) 0 - 99 mg/dL  PSA  Result Value Ref Range   Prostate Specific Ag, Serum 3.0 0.0 - 4.0 ng/mL  TSH  Result Value Ref  Range   TSH 2.320 0.450 - 4.500 uIU/mL  UA/M w/rflx Culture, Routine   Specimen: Blood   BLD  Result Value Ref Range   Specific  Gravity, UA CANCELED    pH, UA CANCELED    Protein,UA CANCELED    Glucose, UA CANCELED    Ketones, UA CANCELED   Hepatitis C Antibody  Result Value Ref Range   Hep C Virus Ab <0.1 0.0 - 0.9 s/co ratio  UA/M w/rflx Culture, Routine   Specimen: Urine   URINE  Result Value Ref Range   Specific Gravity, UA 1.025 1.005 - 1.030   pH, UA 5.5 5.0 - 7.5   Color, UA Yellow Yellow   Appearance Ur Clear Clear   Leukocytes,UA Negative Negative   Protein,UA Negative Negative/Trace   Glucose, UA Negative Negative   Ketones, UA Negative Negative   RBC, UA Negative Negative   Bilirubin, UA Negative Negative   Urobilinogen, Ur 0.2 0.2 - 1.0 mg/dL   Nitrite, UA Negative Negative  Microalbumin, Urine Waived  Result Value Ref Range   Microalb, Ur Waived 10 0 - 19 mg/L   Creatinine, Urine Waived 300 10 - 300 mg/dL   Microalb/Creat Ratio <30 <30 mg/g  Sedimentation rate  Result Value Ref Range   Sed Rate 47 (H) 0 - 30 mm/hr      Assessment & Plan:   Problem List Items Addressed This Visit      Musculoskeletal and Integument   Seronegative arthritis    Back on prednisone so his pain is much better. Only needing his tramadol occasionally. Will refill, with expectation that 3 month supply last at least 4-5 months. Call with any concerns. Continue to monitor.       Relevant Medications   ibuprofen (ADVIL) 200 MG tablet   predniSONE (STERAPRED UNI-PAK 21 TAB) 10 MG (21) TBPK tablet   traMADol (ULTRAM) 50 MG tablet       Follow up plan: Return 4-5 months.

## 2019-05-30 NOTE — Assessment & Plan Note (Signed)
Back on prednisone so his pain is much better. Only needing his tramadol occasionally. Will refill, with expectation that 3 month supply last at least 4-5 months. Call with any concerns. Continue to monitor.

## 2019-05-31 DIAGNOSIS — M1990 Unspecified osteoarthritis, unspecified site: Secondary | ICD-10-CM | POA: Diagnosis not present

## 2019-05-31 DIAGNOSIS — Z1159 Encounter for screening for other viral diseases: Secondary | ICD-10-CM | POA: Diagnosis not present

## 2019-05-31 DIAGNOSIS — H538 Other visual disturbances: Secondary | ICD-10-CM | POA: Diagnosis not present

## 2019-06-03 ENCOUNTER — Ambulatory Visit: Payer: Medicare Other | Admitting: Occupational Therapy

## 2019-06-03 DIAGNOSIS — M85852 Other specified disorders of bone density and structure, left thigh: Secondary | ICD-10-CM | POA: Diagnosis not present

## 2019-06-06 ENCOUNTER — Ambulatory Visit: Payer: Medicare Other | Admitting: Occupational Therapy

## 2019-06-11 ENCOUNTER — Other Ambulatory Visit: Payer: Self-pay

## 2019-06-11 ENCOUNTER — Ambulatory Visit: Payer: Medicare Other | Admitting: Occupational Therapy

## 2019-06-11 DIAGNOSIS — M25632 Stiffness of left wrist, not elsewhere classified: Secondary | ICD-10-CM | POA: Diagnosis not present

## 2019-06-11 DIAGNOSIS — M79641 Pain in right hand: Secondary | ICD-10-CM

## 2019-06-11 DIAGNOSIS — M25642 Stiffness of left hand, not elsewhere classified: Secondary | ICD-10-CM | POA: Diagnosis not present

## 2019-06-11 DIAGNOSIS — M6281 Muscle weakness (generalized): Secondary | ICD-10-CM

## 2019-06-11 DIAGNOSIS — M79642 Pain in left hand: Secondary | ICD-10-CM | POA: Diagnosis not present

## 2019-06-11 DIAGNOSIS — M25631 Stiffness of right wrist, not elsewhere classified: Secondary | ICD-10-CM

## 2019-06-11 DIAGNOSIS — M25641 Stiffness of right hand, not elsewhere classified: Secondary | ICD-10-CM

## 2019-06-11 DIAGNOSIS — R600 Localized edema: Secondary | ICD-10-CM

## 2019-06-11 NOTE — Therapy (Signed)
[facility] [address], [place], [address] Phone: [phone]   Fax:  [phone]  Occupational Therapy Treatment/PRogress report  Patient Details  Name: Shane Duncan MRN: [id number] Date of Birth: 12-Jan-1951 Referring Provider (OT): Park Liter   Encounter Date: 06/11/2019  OT End of Session - 06/11/19 1131    Visit Number  10    Number of Visits  16    Date for OT Re-Evaluation  06/18/19    OT Start Time  9024    OT Stop Time  1128    OT Time Calculation (min)  41 min    Activity Tolerance  Patient tolerated treatment well;Patient limited by pain    Behavior During Therapy  [facility] for tasks assessed/performed       Past Medical History:  Diagnosis Date  . Bronchitis   . Chronic sinusitis   . Esophagitis   . Hemorrhoids   . Hepatitis A   . Hyperlipidemia   . Hypertension   . IFG (impaired fasting glucose)   . Temporal arteritis ([place]) 01/2018    Past Surgical History:  Procedure Laterality Date  . ACHILLES TENDON SURGERY Right 1997  . ANUS SURGERY  1990's   anal fistula repair  . ARTERY BIOPSY Left 02/05/2018   Procedure: BIOPSY TEMPORAL ARTERY;  Surgeon: Algernon Huxley, MD;  Location: [facility] ORS;  Service: Vascular;  Laterality: Left;  . TONSILLECTOMY  1961    There were no vitals filed for this visit.  Subjective Assessment - 06/11/19 1048    Subjective   50 mg Prednisone until 2nd march - and don't go back until April - doing much better with pain , stiffness , weakness - getting my energy back - working on my truck    Pertinent History  Per Dr Meda Coffee note -Seronegative arthritisNegative CCP negative inflammatory arthritis, multiple joints neck shoulder knees ankles and handS. Synovitis in both hands+ wrists unable to make a SEMI-fist. NO psoriasis. BETTER with methotrexate initiated in November 30, 2018, REMICADE (3.2 MG/KG SINCE 03/18/2019) - had 2nd dosis Dec 4th and again next week - need  help - L hand worse than R , wrist also stiff - L hand swollen - and then pain too with moving    Patient Stated Goals  I want to be able to make fist with both hands, to use my hands to play golf, fix things around the house, grip tools , work out , push up , wash hair, grip during bathing and dressing    Currently in Pain?  No/denies         [facility] OT Assessment - 06/11/19 0001      AROM   Right Wrist Extension  58 Degrees    Right Wrist Flexion  75 Degrees    Right Wrist Radial Deviation  25 Degrees    Right Wrist Ulnar Deviation  28 Degrees    Left Wrist Extension  50 Degrees    Left Wrist Flexion  62 Degrees    Left Wrist Radial Deviation  25 Degrees    Left Wrist Ulnar Deviation  30 Degrees      Strength   Right Hand Grip (lbs)  60    Right Hand Lateral Pinch  22.5 lbs    Right Hand 3 Point Pinch  23 lbs    Left Hand Grip (lbs)  55    Left Hand Lateral Pinch  21 lbs    Left Hand 3  Point Pinch  17 lbs      Right Hand AROM   R Index  MCP 0-90  80 Degrees    R Index PIP 0-100  95 Degrees    R Long  MCP 0-90  85 Degrees    R Long PIP 0-100  95 Degrees    R Ring  MCP 0-90  90 Degrees    R Ring PIP 0-100  95 Degrees    R Little  MCP 0-90  90 Degrees    R Little PIP 0-100  90 Degrees      Left Hand AROM   L Index  MCP 0-90  60 Degrees    L Index PIP 0-100  90 Degrees    L Long  MCP 0-90  65 Degrees    L Long PIP 0-100  75 Degrees    L Ring  MCP 0-90  70 Degrees    L Ring PIP 0-100  90 Degrees    L Little  MCP 0-90  90 Degrees    L Little PIP 0-100  85 Degrees      great progress in AROM of digits bilateral hands and wrist- pt still on prednisone 50mg   And decrease pain and edema   still in L 2nd and 3rd digit  decrease wirst flexion and extention - see flowsheet       grip increase greatly in bilateral hands - prehension about same     OT Treatments/Exercises (OP) - 06/11/19 0001      LUE Paraffin   Number Minutes Paraffin  8 Minutes    LUE Paraffin  Location  Hand    Comments  intrinsic fist stretch prior to soft tissue and ROM       soft tissue mobs to volar L hand and wrist prior to review of HEP  graston tool brushing and sweeping tool nr 2 Wrist extention - prayer stretch and flexion over armrest - 30 sec at time  1 min  Few times prior to weight     Cont with moist heat or contrast  intrinsic fist for bilateral hands - stretch Knuckle bender 5 min - L hand 2 band and 3 bands now - can tolerate -with AROM in knuckle bender  Tendon glides  - focus on intrinsic fist  And cont with teal putty  - pain with green firm - with being on prednisone Pain to stay below 2/10  Add AAROM over the edge of table for wrist flexion  And prayer stretch for wrist extention  2 x day         OT Education - 06/11/19 1131    Education Details  progress and changes to HEP    Person(s) Educated  Patient    Methods  Explanation;Demonstration;Tactile cues;Verbal cues;Handout    Comprehension  Verbal cues required;Returned demonstration;Verbalized understanding       OT Short Term Goals - 06/11/19 1235      OT SHORT TERM GOAL #1   Title  Pt to be independent in HEP to increase digits flexion AROM to grasp 2 cm cylinder objects    Status  Achieved      OT SHORT TERM GOAL #2   Title  Bilateral wrist AROM improve in all planes by 10 degrees to ease dressing  and turning doorknob    Status  Achieved        OT Long Term Goals - 06/11/19 1235      OT LONG TERM GOAL #1  Title  Digits flexion improve for pt to touch palm to hold golf club, cut food    Baseline  can touch palm with R hand but not L hand - see flowsheet    Time  6    Period  Weeks    Status  On-going    Target Date  07/23/19      OT LONG TERM GOAL #2   Title  Grip strength increase in bilateral hands to in range for his age to carry more than 10 lbs without increase symptoms , grip tools    Baseline  increase grip - see lfowsheet- can carry 10 lbs    Status  Achieved       OT LONG TERM GOAL #3   Title  Function score on PRWHE improve with more than 15 points to participate in sports, and fixing things around house    Baseline  eval PRWHE score for function was 23.5/50 - improving - but just starting to hold golf club, and around house some activities- but on prednisone 50mg     Time  6    Period  Weeks    Status  On-going    Target Date  07/23/19      OT LONG TERM GOAL #4   Title  Wrist Strength increase for pt to push up from chair , bath tub ,drive  and pull shirt over head    Baseline  decrease extention and flexion of wrist - but good strength in range - cannot push up from tub    Time  6    Period  Weeks    Status  On-going    Target Date  07/23/19            Plan - 06/11/19 1131    Clinical Impression Statement  Pt made great progress from [facility] in Pain , edema and AROM in bilateral hands and wrist- this date shoulder increasing too in pain and AROM with use of pulley's - pt able to touch palm on the R hand , L hand progress but still pain and edema in 2nd and 3rd - great progress in grip strength - but pt still on 55mg  of Prednisone - report increase functional use - pt to cont with wrist and digits AAROM , PROM without pain and increase strengthening    OT Occupational Profile and History  Problem Focused Assessment - Including review of records relating to presenting problem    Occupational performance deficits (Please refer to evaluation for details):  ADL's;IADL's;Rest and Sleep;Education;Leisure;Social Participation;Play    Body Structure / Function / Physical Skills  ADL;Flexibility;ROM;UE functional use;FMC;Dexterity;Edema;Pain;Strength;IADL;Coordination    Rehab Potential  Good    Clinical Decision Making  Limited treatment options, no task modification necessary    Comorbidities Affecting Occupational Performance:  May have comorbidities impacting occupational performance    Modification or Assistance to Complete Evaluation   No  modification of tasks or assist necessary to complete eval    OT Frequency  1x / week    OT Duration  2 weeks    OT Treatment/Interventions  Self-care/ADL training;Therapeutic exercise;Patient/family education;Splinting;Paraffin;Fluidtherapy;Contrast Bath;DME and/or AE instruction;Manual Therapy;Passive range of motion    Plan  assess progress with HEP and adjust as needed    OT Home Exercise Plan  see pt instruction    Consulted and Agree with Plan of Care  Patient       Patient will benefit from skilled therapeutic intervention in order to improve the following deficits and  impairments:   Body Structure / Function / Physical Skills: ADL, Flexibility, ROM, UE functional use, FMC, Dexterity, Edema, Pain, Strength, IADL, Coordination       Visit Diagnosis: Stiffness of left wrist, not elsewhere classified  Stiffness of right hand, not elsewhere classified  Pain in left hand  Muscle weakness (generalized)  Stiffness of left hand, not elsewhere classified  Stiffness of right wrist, not elsewhere classified  Pain in right hand  Localized edema    Problem List Patient Active Problem List   Diagnosis Date Noted  . Seronegative arthritis 01/15/2019  . BPH (benign prostatic hyperplasia) 06/22/2018  . Giant cell arteritis (HCC) 02/13/2018  . Microcytic anemia 02/01/2018  . Diet-controlled diabetes mellitus (HCC)   . Hyperlipidemia   . Hypertension     Oletta Cohn OTR/L,CLT 06/11/2019, 12:38 PM  [facility] [address], [place], [address] Phone: [phone]   Fax:  [phone]  Name: JOVANIE VERGE MRN: [id number] Date of Birth: April 08, 1951

## 2019-06-11 NOTE — Patient Instructions (Signed)
Cont with moist heat or contrast  intrinsic fist for bilateral hands - stretch Knuckle bender 5 min - L hand 2 band and 3 bands now  Tendon glides  And cont with teal putty  Pain to stay below 2/10  Add AAROM over the edge of table for wrist flexion  And prayer stretch for wrist extention  2 x day

## 2019-06-20 DIAGNOSIS — M1990 Unspecified osteoarthritis, unspecified site: Secondary | ICD-10-CM | POA: Diagnosis not present

## 2019-06-20 DIAGNOSIS — R7982 Elevated C-reactive protein (CRP): Secondary | ICD-10-CM | POA: Diagnosis not present

## 2019-06-20 DIAGNOSIS — H538 Other visual disturbances: Secondary | ICD-10-CM | POA: Diagnosis not present

## 2019-06-20 DIAGNOSIS — R519 Headache, unspecified: Secondary | ICD-10-CM | POA: Diagnosis not present

## 2019-06-21 ENCOUNTER — Ambulatory Visit: Payer: Medicare Other | Attending: Family Medicine | Admitting: Occupational Therapy

## 2019-06-21 ENCOUNTER — Other Ambulatory Visit: Payer: Self-pay

## 2019-06-21 DIAGNOSIS — M6281 Muscle weakness (generalized): Secondary | ICD-10-CM | POA: Diagnosis not present

## 2019-06-21 DIAGNOSIS — M25631 Stiffness of right wrist, not elsewhere classified: Secondary | ICD-10-CM | POA: Insufficient documentation

## 2019-06-21 DIAGNOSIS — R600 Localized edema: Secondary | ICD-10-CM | POA: Diagnosis not present

## 2019-06-21 DIAGNOSIS — M25642 Stiffness of left hand, not elsewhere classified: Secondary | ICD-10-CM | POA: Diagnosis not present

## 2019-06-21 DIAGNOSIS — M79641 Pain in right hand: Secondary | ICD-10-CM

## 2019-06-21 DIAGNOSIS — M25632 Stiffness of left wrist, not elsewhere classified: Secondary | ICD-10-CM

## 2019-06-21 DIAGNOSIS — M79642 Pain in left hand: Secondary | ICD-10-CM | POA: Diagnosis not present

## 2019-06-21 DIAGNOSIS — M25641 Stiffness of right hand, not elsewhere classified: Secondary | ICD-10-CM | POA: Insufficient documentation

## 2019-06-21 NOTE — Therapy (Signed)
[facility] [address] [place], [address] Phone: [phone]   Fax:  [phone]  Occupational Therapy Treatment  Patient Details  Name: Shane Duncan MRN: [id number] Date of Birth: [birth year] Referring Provider (OT): Park Liter   Encounter Date: 06/21/2019  OT End of Session - 06/21/19 1230    Visit Number  11    Number of Visits  16    Date for OT Re-Evaluation  08/16/19    OT Start Time  1100    OT Stop Time  1152    OT Time Calculation (min)  52 min    Activity Tolerance  Patient tolerated treatment well;Patient limited by pain    Behavior During Therapy  [facility] for tasks assessed/performed       Past Medical History:  Diagnosis Date  . Bronchitis   . Chronic sinusitis   . Esophagitis   . Hemorrhoids   . Hepatitis A   . Hyperlipidemia   . Hypertension   . IFG (impaired fasting glucose)   . Temporal arteritis ([place]) 01/2018    Past Surgical History:  Procedure Laterality Date  . ACHILLES TENDON SURGERY Right 1997  . ANUS SURGERY  1990's   anal fistula repair  . ARTERY BIOPSY Left 02/05/2018   Procedure: BIOPSY TEMPORAL ARTERY;  Surgeon: Algernon Huxley, MD;  Location: [facility] ORS;  Service: Vascular;  Laterality: Left;  . TONSILLECTOMY  1961    There were no vitals filed for this visit.  Subjective Assessment - 06/21/19 1227    Subjective   I am down to 40mg  Prednisone - doing okay - motion feels better - hit some golf balls the other days- working in yard more - but little dizzy and more cramping -but if I feel good - I over do things and pay for it at night time or next day - hands feels better    Pertinent History  Per Dr Meda Coffee note -Seronegative arthritisNegative CCP negative inflammatory arthritis, multiple joints neck shoulder knees ankles and handS. Synovitis in both hands+ wrists unable to make a SEMI-fist. NO psoriasis. BETTER with methotrexate initiated in November 30, 2018, REMICADE  (3.2 MG/KG SINCE 03/18/2019) - had 2nd dosis Dec 4th and again next week - need help - L hand worse than R , wrist also stiff - L hand swollen - and then pain too with moving    Patient Stated Goals  I want to be able to make fist with both hands, to use my hands to play golf, fix things around the house, grip tools , work out , push up , wash hair, grip during bathing and dressing    Currently in Pain?  No/denies         [facility] OT Assessment - 06/21/19 0001      AROM   Right Wrist Extension  58 Degrees    Right Wrist Flexion  75 Degrees    Left Wrist Extension  55 Degrees    Left Wrist Flexion  65 Degrees      Strength   Right Hand Grip (lbs)  68    Right Hand Lateral Pinch  25 lbs    Right Hand 3 Point Pinch  24 lbs    Left Hand Grip (lbs)  68    Left Hand Lateral Pinch  21 lbs    Left Hand 3 Point Pinch  19 lbs      Right Hand AROM   R Index  MCP 0-90  82 Degrees    R Index PIP 0-100  98 Degrees    R Long  MCP 0-90  90 Degrees    R Long PIP 0-100  96 Degrees    R Ring  MCP 0-90  85 Degrees    R Ring PIP 0-100  98 Degrees    R Little  MCP 0-90  90 Degrees    R Little PIP 0-100  90 Degrees      Left Hand AROM   L Index  MCP 0-90  65 Degrees    L Index PIP 0-100  95 Degrees    L Long  MCP 0-90  75 Degrees    L Long PIP 0-100  80 Degrees    L Ring  MCP 0-90  84 Degrees    L Ring PIP 0-100  95 Degrees    L Little  MCP 0-90  90 Degrees    L Little PIP 0-100  80 Degrees        AROM for bilateral wrist and digits assess - cont to make great progress  And grip and prehension strength- see flow sheets         OT Treatments/Exercises (OP) - 06/21/19 0001      RUE Paraffin   Number Minutes Paraffin  8 Minutes    RUE Paraffin Location  Hand   wrist   Comments  prior to soft tissue and CPM for wrist       LUE Paraffin   Number Minutes Paraffin  8 Minutes    LUE Paraffin Location  Hand;Wrist    Comments  prior to soft tissue and CPM for wrist      Shane Duncan tool nr  2 done on volar digits and wrist prior to PROM for PIP extention - pt ed on doing at home  Cont with HEP for digits flexion -made great progress  did do CPM on BTE this date for bilateral wrist flexion and extention -  Progress well   pt to focus on wrist PROM  Shoulder AROM flexion and ABD progress great - but ext rotation reaching behind head- pect stretch done with pt and add to HEP - gentle doorway stretch  After warm shower         OT Education - 06/21/19 1230    Education Details  progress and changes to HEP    Person(s) Educated  Patient    Methods  Explanation;Demonstration;Tactile cues;Verbal cues;Handout    Comprehension  Verbal cues required;Returned demonstration;Verbalized understanding       OT Short Term Goals - 06/21/19 1233      OT SHORT TERM GOAL #1   Title  Pt to be independent in HEP to increase digits flexion AROM to grasp 2 cm cylinder objects    Status  Achieved      OT SHORT TERM GOAL #2   Title  Bilateral wrist AROM improve in all planes by 10 degrees to ease dressing  and turning doorknob    Baseline  did increase - but about the same as last week    Time  4    Period  Weeks    Status  On-going    Target Date  07/19/19        OT Long Term Goals - 06/11/19 1235      OT LONG TERM GOAL #1   Title  Digits flexion improve for pt to touch palm to hold golf club, cut food    Baseline  can touch palm with R hand but not L hand - see flowsheet    Time  6    Period  Weeks    Status  On-going    Target Date  07/23/19      OT LONG TERM GOAL #2   Title  Grip strength increase in bilateral hands to in range for his age to carry more than 10 lbs without increase symptoms , grip tools    Baseline  increase grip - see lfowsheet- can carry 10 lbs    Status  Achieved      OT LONG TERM GOAL #3   Title  Function score on PRWHE improve with more than 15 points to participate in sports, and fixing things around house    Baseline  eval PRWHE score for  function was 23.5/50 - improving - but just starting to hold golf club, and around house some activities- but on prednisone 50mg     Time  6    Period  Weeks    Status  On-going    Target Date  07/23/19      OT LONG TERM GOAL #4   Title  Wrist Strength increase for pt to push up from chair , bath tub ,drive  and pull shirt over head    Baseline  decrease extention and flexion of wrist - but good strength in range - cannot push up from tub    Time  6    Period  Weeks    Status  On-going    Target Date  07/23/19            Plan - 06/21/19 1231    Clinical Impression Statement  Pt made great progress again this last week doing HEP at home , being on Prednisone - did decrease this week to 40 mg - increase AROM in hands , grip and prehension strength increase - elbow and shoulder AROM - did add this date PROM for PIP extention , pect stretch for ext rotation , and focus on wrist flexion and extention ROM - to pace himself - keep pain under 2/10    OT Occupational Profile and History  Problem Focused Assessment - Including review of records relating to presenting problem    Occupational performance deficits (Please refer to evaluation for details):  ADL's;IADL's;Rest and Sleep;Education;Leisure;Social Participation;Play    Body Structure / Function / Physical Skills  ADL;Flexibility;ROM;UE functional use;FMC;Dexterity;Edema;Pain;Strength;IADL;Coordination    Rehab Potential  Good    Clinical Decision Making  Limited treatment options, no task modification necessary    Comorbidities Affecting Occupational Performance:  May have comorbidities impacting occupational performance    Modification or Assistance to Complete Evaluation   No modification of tasks or assist necessary to complete eval    OT Frequency  Biweekly    OT Duration  8 weeks    OT Treatment/Interventions  Self-care/ADL training;Therapeutic exercise;Patient/family education;Splinting;Paraffin;Fluidtherapy;Contrast Bath;DME  and/or AE instruction;Manual Therapy;Passive range of motion    Plan  assess progress with HEP and adjust as needed    OT Home Exercise Plan  see pt instruction    Consulted and Agree with Plan of Care  Patient       Patient will benefit from skilled therapeutic intervention in order to improve the following deficits and impairments:   Body Structure / Function / Physical Skills: ADL, Flexibility, ROM, UE functional use, FMC, Dexterity, Edema, Pain, Strength, IADL, Coordination       Visit Diagnosis: Stiffness of right hand, not elsewhere classified  Pain in left hand  Muscle weakness (generalized)  Stiffness of left hand, not elsewhere classified  Stiffness of right wrist, not elsewhere classified  Stiffness of left wrist, not elsewhere classified  Pain in right hand  Localized edema    Problem List Patient Active Problem List   Diagnosis Date Noted  . Seronegative arthritis 01/15/2019  . BPH (benign prostatic hyperplasia) 06/22/2018  . Giant cell arteritis ([facility]) 02/13/2018  . Microcytic anemia 02/01/2018  . Diet-controlled diabetes mellitus ([facility])   . Hyperlipidemia   . Hypertension     Shane Duncan OTR/L,CLT 06/21/2019, 12:36 PM  [facility] [address] [place], [address] Phone: [phone]   Fax:  [phone]  Name: Shane Duncan MRN: [id number] Date of Birth: 1950-12-30

## 2019-06-21 NOTE — Patient Instructions (Signed)
Gentle pect stretch in doorway AAROM and PROM for wrist flexion , ext Cont HEP for hands -but do some PIP extention PROM  Not over do - keep pain under 2/10

## 2019-07-01 ENCOUNTER — Ambulatory Visit: Payer: Medicare Other | Admitting: Occupational Therapy

## 2019-07-01 ENCOUNTER — Other Ambulatory Visit: Payer: Self-pay

## 2019-07-01 DIAGNOSIS — R600 Localized edema: Secondary | ICD-10-CM

## 2019-07-01 DIAGNOSIS — M25641 Stiffness of right hand, not elsewhere classified: Secondary | ICD-10-CM | POA: Diagnosis not present

## 2019-07-01 DIAGNOSIS — M6281 Muscle weakness (generalized): Secondary | ICD-10-CM

## 2019-07-01 DIAGNOSIS — M79642 Pain in left hand: Secondary | ICD-10-CM

## 2019-07-01 DIAGNOSIS — M25642 Stiffness of left hand, not elsewhere classified: Secondary | ICD-10-CM | POA: Diagnosis not present

## 2019-07-01 DIAGNOSIS — M79641 Pain in right hand: Secondary | ICD-10-CM

## 2019-07-01 DIAGNOSIS — M25632 Stiffness of left wrist, not elsewhere classified: Secondary | ICD-10-CM | POA: Diagnosis not present

## 2019-07-01 DIAGNOSIS — M25631 Stiffness of right wrist, not elsewhere classified: Secondary | ICD-10-CM | POA: Diagnosis not present

## 2019-07-01 NOTE — Therapy (Signed)
[facility] [address], [place], [address] Phone: [phone]   Fax:  [phone]  Occupational Therapy Treatment  Patient Details  Name: Shane Duncan MRN: [id number] Date of Birth: [birth year] Referring Provider (OT): Park Liter   Encounter Date: 07/01/2019  OT End of Session - 07/01/19 1357    Visit Number  12    Number of Visits  16    Date for OT Re-Evaluation  08/16/19    OT Start Time  1330    OT Stop Time  1415    OT Time Calculation (min)  45 min    Activity Tolerance  Patient tolerated treatment well;Patient limited by pain    Behavior During Therapy  [facility] for tasks assessed/performed       Past Medical History:  Diagnosis Date  . Bronchitis   . Chronic sinusitis   . Esophagitis   . Hemorrhoids   . Hepatitis A   . Hyperlipidemia   . Hypertension   . IFG (impaired fasting glucose)   . Temporal arteritis ([place]) 01/2018    Past Surgical History:  Procedure Laterality Date  . ACHILLES TENDON SURGERY Right 1997  . ANUS SURGERY  1990's   anal fistula repair  . ARTERY BIOPSY Left 02/05/2018   Procedure: BIOPSY TEMPORAL ARTERY;  Surgeon: Algernon Huxley, MD;  Location: [facility] ORS;  Service: Vascular;  Laterality: Left;  . TONSILLECTOMY  1961    There were no vitals filed for this visit.  Subjective Assessment - 07/01/19 1332    Subjective   Used hands more with yardwork, buffing car , raking - throwing ball with dog - hands doirng great - going down to 35 mg of Prednisone tomorrow    Pertinent History  Per Dr Meda Coffee note -Seronegative arthritisNegative CCP negative inflammatory arthritis, multiple joints neck shoulder knees ankles and handS. Synovitis in both hands+ wrists unable to make a SEMI-fist. NO psoriasis. BETTER with methotrexate initiated in November 30, 2018, REMICADE (3.2 MG/KG SINCE 03/18/2019) - had 2nd dosis Dec 4th and again next week - need help - L hand worse than R , wrist  also stiff - L hand swollen - and then pain too with moving    Patient Stated Goals  I want to be able to make fist with both hands, to use my hands to play golf, fix things around the house, grip tools , work out , push up , wash hair, grip during bathing and dressing    Currently in Pain?  Yes    Pain Score  3     Pain Location  Wrist    Pain Orientation  Right;Left    Pain Descriptors / Indicators  Aching;Tightness    Pain Type  Chronic pain    Pain Onset  More than a month ago    Pain Frequency  Constant         [facility] OT Assessment - 07/01/19 0001      AROM   Right Wrist Extension  62 Degrees    Right Wrist Flexion  82 Degrees    Left Wrist Extension  58 Degrees    Left Wrist Flexion  75 Degrees      Strength   Right Hand Grip (lbs)  72    Right Hand Lateral Pinch  25 lbs    Right Hand 3 Point Pinch  24 lbs    Left Hand Grip (lbs)  70    Left Hand Lateral  Pinch  23 lbs    Left Hand 3 Point Pinch  23 lbs      Right Hand AROM   R Index  MCP 0-90  85 Degrees    R Index PIP 0-100  98 Degrees    R Long  MCP 0-90  90 Degrees    R Long PIP 0-100  96 Degrees    R Ring  MCP 0-90  85 Degrees    R Ring PIP 0-100  98 Degrees    R Little  MCP 0-90  90 Degrees    R Little PIP 0-100  95 Degrees      Left Hand AROM   L Index  MCP 0-90  75 Degrees    L Index PIP 0-100  100 Degrees    L Long  MCP 0-90  80 Degrees    L Long PIP 0-100  80 Degrees    L Ring  MCP 0-90  90 Degrees    L Ring PIP 0-100  100 Degrees    L Little  MCP 0-90  90 Degrees    L Little PIP 0-100  90 Degrees         AROM for bilateral wrist and digits assess - cont to make great progress  And grip and prehension strength- see flow sheets          OT Treatments/Exercises (OP) - 07/01/19 0001      RUE Paraffin   Number Minutes Paraffin  8 Minutes    RUE Paraffin Location  Hand   wrist    Comments  prior to soft tissue and stretches       LUE Paraffin   Number Minutes Paraffin  8 Minutes    LUE  Paraffin Location  Hand;Wrist    Comments  prior to soft tissue and ROM        Leonette Monarch tool nr 2 done on volar digits and wrist prior to [facility] for wrist flexion and composite extention for digits and wrist ext done by OT  - pt ed on doing at home  Cont with table or wall slides for wrist extention     tendon glides -with focus on L hand intrinsic fist for 3rd and 5th  And extention of PIP's  Upgrade to green firm putty - gripping 15 reps - rolling afterwards for extention massage  increase weekly another set of 15 -but pain free Cont with shoulder and elbow AAROM and functional strengthening -  Pt is decreasing again the 31st to 30mg  Prednisone - will follow up with me after that but prior to appt with MD    OT Education - 07/01/19 1555    Education Details  progress and changes to HEP    Person(s) Educated  Patient    Methods  Explanation;Demonstration;Tactile cues;Verbal cues;Handout    Comprehension  Verbal cues required;Returned demonstration;Verbalized understanding       OT Short Term Goals - 06/21/19 1233      OT SHORT TERM GOAL #1   Title  Pt to be independent in HEP to increase digits flexion AROM to grasp 2 cm cylinder objects    Status  Achieved      OT SHORT TERM GOAL #2   Title  Bilateral wrist AROM improve in all planes by 10 degrees to ease dressing  and turning doorknob    Baseline  did increase - but about the same as last week    Time  4    Period  Weeks    Status  On-going    Target Date  07/19/19        OT Long Term Goals - 06/11/19 1235      OT LONG TERM GOAL #1   Title  Digits flexion improve for pt to touch palm to hold golf club, cut food    Baseline  can touch palm with R hand but not L hand - see flowsheet    Time  6    Period  Weeks    Status  On-going    Target Date  07/23/19      OT LONG TERM GOAL #2   Title  Grip strength increase in bilateral hands to in range for his age to carry more than 10 lbs without increase symptoms , grip tools     Baseline  increase grip - see lfowsheet- can carry 10 lbs    Status  Achieved      OT LONG TERM GOAL #3   Title  Function score on PRWHE improve with more than 15 points to participate in sports, and fixing things around house    Baseline  eval PRWHE score for function was 23.5/50 - improving - but just starting to hold golf club, and around house some activities- but on prednisone 50mg     Time  6    Period  Weeks    Status  On-going    Target Date  07/23/19      OT LONG TERM GOAL #4   Title  Wrist Strength increase for pt to push up from chair , bath tub ,drive  and pull shirt over head    Baseline  decrease extention and flexion of wrist - but good strength in range - cannot push up from tub    Time  6    Period  Weeks    Status  On-going    Target Date  07/23/19            Plan - 07/01/19 1357    Clinical Impression Statement  Pt cont to make progress every time coming in since being on prednisone- pt grip increase , digits flexion and extention as well as wrist improving - and increase functional use - pt to focus still on wrist ROM , functional use and grip strenght- decreasing prednisone tomorrow to 35mg  - will check on pt in 3 wks - week prior to see MD    OT Occupational Profile and History  Problem Focused Assessment - Including review of records relating to presenting problem    Occupational performance deficits (Please refer to evaluation for details):  ADL's;IADL's;Rest and Sleep;Education;Leisure;Social Participation;Play    Body Structure / Function / Physical Skills  ADL;Flexibility;ROM;UE functional use;FMC;Dexterity;Edema;Pain;Strength;IADL;Coordination    Rehab Potential  Good    Clinical Decision Making  Limited treatment options, no task modification necessary    Comorbidities Affecting Occupational Performance:  May have comorbidities impacting occupational performance    Modification or Assistance to Complete Evaluation   No modification of tasks or assist  necessary to complete eval    OT Frequency  --   3wks   OT Duration  8 weeks    OT Treatment/Interventions  Self-care/ADL training;Therapeutic exercise;Patient/family education;Splinting;Paraffin;Fluidtherapy;Contrast Bath;DME and/or AE instruction;Manual Therapy;Passive range of motion    Plan  assess progress with HEP and adjust as needed    OT Home Exercise Plan  see pt instruction    Consulted and Agree with Plan of Care  Patient  Patient will benefit from skilled therapeutic intervention in order to improve the following deficits and impairments:   Body Structure / Function / Physical Skills: ADL, Flexibility, ROM, UE functional use, FMC, Dexterity, Edema, Pain, Strength, IADL, Coordination       Visit Diagnosis: Pain in left hand  Stiffness of right hand, not elsewhere classified  Muscle weakness (generalized)  Stiffness of left hand, not elsewhere classified  Stiffness of right wrist, not elsewhere classified  Stiffness of left wrist, not elsewhere classified  Pain in right hand  Localized edema    Problem List Patient Active Problem List   Diagnosis Date Noted  . Seronegative arthritis 01/15/2019  . BPH (benign prostatic hyperplasia) 06/22/2018  . Giant cell arteritis ([facility]) 02/13/2018  . Microcytic anemia 02/01/2018  . Diet-controlled diabetes mellitus ([facility])   . Hyperlipidemia   . Hypertension     Oletta Cohn OTR/L,CLT  07/01/2019, 4:59 PM  [facility] [address] [place], [address] Phone: [phone]   Fax:  [phone]  Name: TALIB HEADLEY MRN: [id number] Date of Birth: 1951-03-07

## 2019-07-01 NOTE — Patient Instructions (Signed)
Focus on wrist stretches for flexion , extention  And tendon glides -with focus on L hand intrinsic fist for 3rd and 5th  And extention of PIP's  Upgrade to green firm putty - gripping and 15 reps - rolling afterwards for extention massage  increase weekly another set of 15 -but pain free Cont with shoulder and elbow AAROM and functional strengthening - keeping pain under 2/10

## 2019-07-25 ENCOUNTER — Other Ambulatory Visit: Payer: Self-pay

## 2019-07-25 ENCOUNTER — Ambulatory Visit: Payer: Medicare Other | Attending: Family Medicine | Admitting: Occupational Therapy

## 2019-07-25 DIAGNOSIS — M6281 Muscle weakness (generalized): Secondary | ICD-10-CM | POA: Insufficient documentation

## 2019-07-25 DIAGNOSIS — R600 Localized edema: Secondary | ICD-10-CM | POA: Insufficient documentation

## 2019-07-25 DIAGNOSIS — M79642 Pain in left hand: Secondary | ICD-10-CM | POA: Insufficient documentation

## 2019-07-25 DIAGNOSIS — M79641 Pain in right hand: Secondary | ICD-10-CM | POA: Diagnosis not present

## 2019-07-25 DIAGNOSIS — M25631 Stiffness of right wrist, not elsewhere classified: Secondary | ICD-10-CM | POA: Insufficient documentation

## 2019-07-25 DIAGNOSIS — M25641 Stiffness of right hand, not elsewhere classified: Secondary | ICD-10-CM | POA: Insufficient documentation

## 2019-07-25 DIAGNOSIS — M25642 Stiffness of left hand, not elsewhere classified: Secondary | ICD-10-CM | POA: Diagnosis not present

## 2019-07-25 DIAGNOSIS — M25632 Stiffness of left wrist, not elsewhere classified: Secondary | ICD-10-CM | POA: Diagnosis not present

## 2019-07-25 NOTE — Patient Instructions (Signed)
Work on flexibility more than strength for UE now  - yoga maybe and then some LE strengthening - pool or biking

## 2019-07-25 NOTE — Therapy (Signed)
[facility] [address], [place], [address] Phone: [phone]   Fax:  [phone]  Occupational Therapy Treatment  Patient Details  Name: Shane Duncan MRN: [id number] Date of Birth: 11/28/1950 Referring Provider (OT): Olevia Perches   Encounter Date: 07/25/2019  OT End of Session - 07/25/19 1359    Visit Number  13    Number of Visits  16    Date for OT Re-Evaluation  08/16/19    OT Start Time  1330    OT Stop Time  1359    OT Time Calculation (min)  29 min    Activity Tolerance  Patient tolerated treatment well    Behavior During Therapy  [facility] for tasks assessed/performed       Past Medical History:  Diagnosis Date  . Bronchitis   . Chronic sinusitis   . Esophagitis   . Hemorrhoids   . Hepatitis A   . Hyperlipidemia   . Hypertension   . IFG (impaired fasting glucose)   . Temporal arteritis ([facility]) 01/2018    Past Surgical History:  Procedure Laterality Date  . ACHILLES TENDON SURGERY Right 1997  . ANUS SURGERY  1990's   anal fistula repair  . ARTERY BIOPSY Left 02/05/2018   Procedure: BIOPSY TEMPORAL ARTERY;  Surgeon: Annice Needy, MD;  Location: [facility] ORS;  Service: Vascular;  Laterality: Left;  . TONSILLECTOMY  1961    There were no vitals filed for this visit.  Subjective Assessment - 07/25/19 1357    Subjective   I am doing great - played yesterday and the day before 18 holes of golf, working in yard, doing every thing I want to - no pain ,stiffness - grip feels good, and elbow/shoulders too    Pertinent History  Per Dr Renard Matter note -Seronegative arthritisNegative CCP negative inflammatory arthritis, multiple joints neck shoulder knees ankles and handS. Synovitis in both hands+ wrists unable to make a SEMI-fist. NO psoriasis. BETTER with methotrexate initiated in November 30, 2018, REMICADE (3.2 MG/KG SINCE 03/18/2019) - had 2nd dosis Dec 4th and again next week - need help - L hand worse  than R , wrist also stiff - L hand swollen - and then pain too with moving    Patient Stated Goals  I want to be able to make fist with both hands, to use my hands to play golf, fix things around the house, grip tools , work out , push up , wash hair, grip during bathing and dressing    Currently in Pain?  No/denies         [facility] OT Assessment - 07/25/19 0001      AROM   Right Wrist Extension  62 Degrees    Right Wrist Flexion  80 Degrees    Left Wrist Extension  68 Degrees    Left Wrist Flexion  85 Degrees      Strength   Right Hand Grip (lbs)  80    Right Hand Lateral Pinch  24 lbs    Right Hand 3 Point Pinch  21 lbs    Left Hand Grip (lbs)  75    Left Hand Lateral Pinch  22 lbs    Left Hand 3 Point Pinch  23 lbs      Right Hand AROM   R Index  MCP 0-90  85 Degrees    R Index PIP 0-100  95 Degrees    R Long  MCP 0-90  90 Degrees    R Long PIP 0-100  95 Degrees    R Ring  MCP 0-90  85 Degrees    R Ring PIP 0-100  95 Degrees    R Little  MCP 0-90  90 Degrees    R Little PIP 0-100  95 Degrees      Left Hand AROM   L Index  MCP 0-90  80 Degrees    L Index PIP 0-100  100 Degrees    L Long  MCP 0-90  85 Degrees    L Long PIP 0-100  90 Degrees    L Ring  MCP 0-90  85 Degrees    L Ring PIP 0-100  100 Degrees    L Little  MCP 0-90  90 Degrees    L Little PIP 0-100  90 Degrees       Pt seen for 13 visits since 5th January - made fantastic progress in pain ,stiffness, AROM and strength in bilateral UE  Grip and prehension strength WFL - and digits flexion ,ext [facility] - see flowsheet  PRWHE function and pain score 0/50   Pt encourage to cont to work on flexibility more than strengthening for UE - yoga  And then for LE maybe need to look into bike or pool  Pt doing a lot of yard work and golf Pt to see rheumatologist MOnday -and prednisone still on  30mg - don't know what will change with that   pt not discharge - will keep chart open while medication will be change -can call  if he needs me again                 OT Education - 07/25/19 1359    Education Details  progress and changes to HEP    Person(s) Educated  Patient    Methods  Explanation;Demonstration;Tactile cues;Verbal cues;Handout    Comprehension  Verbal cues required;Returned demonstration;Verbalized understanding       OT Short Term Goals - 07/25/19 1403      OT SHORT TERM GOAL #1   Title  Pt to be independent in HEP to increase digits flexion AROM to grasp 2 cm cylinder objects    Status  Achieved      OT SHORT TERM GOAL #2   Title  Bilateral wrist AROM improve in all planes by 10 degrees to ease dressing  and turning doorknob    Status  Achieved        OT Long Term Goals - 07/25/19 1403      OT LONG TERM GOAL #1   Title  Digits flexion improve for pt to touch palm to hold golf club, cut food    Status  Achieved      OT LONG TERM GOAL #2   Title  Grip strength increase in bilateral hands to in range for his age to carry more than 10 lbs without increase symptoms , grip tools    Status  Achieved      OT LONG TERM GOAL #3   Title  Function score on PRWHE improve with more than 15 points to participate in sports, and fixing things around house    Baseline  eval PRWHE score for function was 23.5/50 -  and now 0/50 - but still on 30mg  prednisone    Status  Achieved      OT LONG TERM GOAL #4   Title  Wrist Strength increase for pt to push up from chair , bath tub ,  drive  and pull shirt over head    Status  Achieved            Plan - 07/25/19 1400    Clinical Impression Statement  Pt made fantactic progress in 13 visits in pain , AROM and strength in UE - pt AROM WFL at wrist and digits , grip and prehension WFL - pt still on 30mg  Prednisone - appt with MD on MOnday - pt to cont with functional strength but recommend for pt to maintain and focus on flexibility - yoga, pool , bike - and can contact me if he needs me over the next 2-3 months if any needs occur with  medication changes    OT Occupational Profile and History  Problem Focused Assessment - Including review of records relating to presenting problem    Occupational performance deficits (Please refer to evaluation for details):  ADL's;IADL's;Rest and Sleep;Education;Leisure;Social Participation;Play    Body Structure / Function / Physical Skills  ADL;Flexibility;ROM;UE functional use;FMC;Dexterity;Edema;Pain;Strength;IADL;Coordination    Rehab Potential  Good    Clinical Decision Making  Limited treatment options, no task modification necessary    Comorbidities Affecting Occupational Performance:  May have comorbidities impacting occupational performance    Modification or Assistance to Complete Evaluation   No modification of tasks or assist necessary to complete eval    OT Frequency  Monthly    OT Duration  4 weeks    OT Treatment/Interventions  Self-care/ADL training;Therapeutic exercise;Patient/family education;Splinting;Paraffin;Fluidtherapy;Contrast Bath;DME and/or AE instruction;Manual Therapy;Passive range of motion    Plan  contact me if need during mediation changes    OT Home Exercise Plan  see pt instruction    Consulted and Agree with Plan of Care  Patient       Patient will benefit from skilled therapeutic intervention in order to improve the following deficits and impairments:   Body Structure / Function / Physical Skills: ADL, Flexibility, ROM, UE functional use, FMC, Dexterity, Edema, Pain, Strength, IADL, Coordination       Visit Diagnosis: Pain in left hand  Stiffness of right hand, not elsewhere classified  Muscle weakness (generalized)  Stiffness of left hand, not elsewhere classified  Stiffness of right wrist, not elsewhere classified  Stiffness of left wrist, not elsewhere classified  Localized edema  Pain in right hand    Problem List Patient Active Problem List   Diagnosis Date Noted  . Seronegative arthritis 01/15/2019  . BPH (benign prostatic  hyperplasia) 06/22/2018  . Giant cell arteritis ([place]) 02/13/2018  . Microcytic anemia 02/01/2018  . Diet-controlled diabetes mellitus ([place])   . Hyperlipidemia   . Hypertension     Rosalyn Gess OTR/L,CLT 07/25/2019, 2:04 PM  [facility] [address] [place], [address] Phone: [phone]   Fax:  [phone]  Name: Shane Duncan MRN: [id number] Date of Birth: 1951-02-19

## 2019-07-29 DIAGNOSIS — Z7952 Long term (current) use of systemic steroids: Secondary | ICD-10-CM | POA: Diagnosis not present

## 2019-07-29 DIAGNOSIS — Z0000 Encounter for general adult medical examination without abnormal findings: Secondary | ICD-10-CM | POA: Diagnosis not present

## 2019-07-29 DIAGNOSIS — M1990 Unspecified osteoarthritis, unspecified site: Secondary | ICD-10-CM | POA: Diagnosis not present

## 2019-07-29 DIAGNOSIS — Z6827 Body mass index (BMI) 27.0-27.9, adult: Secondary | ICD-10-CM | POA: Diagnosis not present

## 2019-08-21 ENCOUNTER — Encounter: Payer: Self-pay | Admitting: Family Medicine

## 2019-08-21 DIAGNOSIS — H2513 Age-related nuclear cataract, bilateral: Secondary | ICD-10-CM | POA: Diagnosis not present

## 2019-09-30 ENCOUNTER — Ambulatory Visit: Payer: Medicare Other | Admitting: Family Medicine

## 2019-10-07 ENCOUNTER — Encounter: Payer: Self-pay | Admitting: Family Medicine

## 2019-10-07 ENCOUNTER — Other Ambulatory Visit: Payer: Self-pay

## 2019-10-07 ENCOUNTER — Ambulatory Visit (INDEPENDENT_AMBULATORY_CARE_PROVIDER_SITE_OTHER): Payer: Medicare Other | Admitting: Family Medicine

## 2019-10-07 VITALS — BP 128/90 | HR 98 | Temp 98.8°F | Wt 208.1 lb

## 2019-10-07 DIAGNOSIS — R351 Nocturia: Secondary | ICD-10-CM

## 2019-10-07 DIAGNOSIS — N401 Enlarged prostate with lower urinary tract symptoms: Secondary | ICD-10-CM | POA: Diagnosis not present

## 2019-10-07 DIAGNOSIS — E785 Hyperlipidemia, unspecified: Secondary | ICD-10-CM

## 2019-10-07 DIAGNOSIS — E119 Type 2 diabetes mellitus without complications: Secondary | ICD-10-CM

## 2019-10-07 DIAGNOSIS — I10 Essential (primary) hypertension: Secondary | ICD-10-CM

## 2019-10-07 DIAGNOSIS — D509 Iron deficiency anemia, unspecified: Secondary | ICD-10-CM

## 2019-10-07 DIAGNOSIS — M316 Other giant cell arteritis: Secondary | ICD-10-CM

## 2019-10-07 DIAGNOSIS — M1380 Other specified arthritis, unspecified site: Secondary | ICD-10-CM

## 2019-10-07 LAB — MICROALBUMIN, URINE WAIVED
Creatinine, Urine Waived: 200 mg/dL (ref 10–300)
Microalb, Ur Waived: 10 mg/L (ref 0–19)
Microalb/Creat Ratio: <30 mg/g (ref <30)

## 2019-10-07 LAB — BAYER DCA HB A1C WAIVED: HB A1C (BAYER DCA - WAIVED): 5.6 % (ref <7.0)

## 2019-10-07 NOTE — Assessment & Plan Note (Signed)
Rechecking labs today. Await results. Treat as needed. Call with any concerns.  

## 2019-10-07 NOTE — Assessment & Plan Note (Signed)
Due to chronic prednisone. A1c today normal at 5.6- we will resolve this problem. Continue to monitor.

## 2019-10-07 NOTE — Assessment & Plan Note (Signed)
Following with [facility]. Continue to monitor. Call with any concerns. Does not need tramadol at this time. Continue to monitor.

## 2019-10-07 NOTE — Assessment & Plan Note (Signed)
Following with [facility]. Continue to monitor. Call with any concerns.

## 2019-10-07 NOTE — Assessment & Plan Note (Signed)
Under good control off medicine. Continue to monitor. Call with any concerns. Continue to monitor.

## 2019-10-07 NOTE — Assessment & Plan Note (Signed)
Under good control off medicine. Continue to monitor. Call with any concerns.  

## 2019-10-07 NOTE — Progress Notes (Signed)
BP 128/90 (BP Location: Left Arm, Cuff Size: Normal)   Pulse 98   Temp 98.8 F (37.1 C)   Wt 208 lb 2 oz (94.4 kg)   SpO2 98%   BMI 29.46 kg/m    Subjective:    Patient ID: Shane Duncan, male    DOB: 1950-11-17, 69 y.o.   MRN: [id number]  HPI: Shane Duncan is a 69 y.o. male  Chief Complaint  Patient presents with  . Hyperlipidemia  . Hypertension   CHRONIC PAIN- has been seeing [facility] he's feeling his body a little more, but able to play golf and do a lot more. Feeling much better. Has not needed his tramadol  Present dose: 10 Morphine equivalents Pain control status: better Duration: chronic Location: hands Quality: aching and sore Current Pain Level: mild Previous Pain Level: severe Breakthrough pain: no Benefit from narcotic medications: yes What Activities task can be accomplished with current medication? Able to play golf and enjoy himself Interested in weaning off narcotics:yes   Stool softners/OTC fiber: no  Previous pain specialty evaluation: no Non-narcotic analgesic meds: no Narcotic contract: no  HYPERTENSION / HYPERLIPIDEMIA Satisfied with current treatment? yes Duration of hypertension: chronic BP monitoring frequency: not checking BP medication side effects: not on anything Past BP meds: none Duration of hyperlipidemia: chronic Cholesterol medication side effects: not on anything Cholesterol supplements: none Past cholesterol medications: none Medication compliance: N/A Aspirin: no Recent stressors: no Recurrent headaches: no Visual changes: no Palpitations: no Dyspnea: no Chest pain: no Lower extremity edema: no Dizzy/lightheaded: no  DIABETES Hypoglycemic episodes:no Polydipsia/polyuria: no Visual disturbance: no Chest pain: no Paresthesias: no Glucose Monitoring: no  Accucheck frequency: Not Checking Taking Insulin?: no Blood Pressure Monitoring: not checking Retinal Examination: N/A Foot Exam: Up to Date Diabetic  Education: Not Completed Pneumovax: Up to Date Influenza: Up to Date Aspirin: no  Relevant past medical, surgical, family and social history reviewed and updated as indicated. Interim medical history since our last visit reviewed. Allergies and medications reviewed and updated.  Review of Systems  Constitutional: Negative.   Respiratory: Negative.   Cardiovascular: Negative.   Gastrointestinal: Negative.   Genitourinary: Negative.   Musculoskeletal: Negative.   Neurological: Negative.   Psychiatric/Behavioral: Negative.     Per HPI unless specifically indicated above     Objective:    BP 128/90 (BP Location: Left Arm, Cuff Size: Normal)   Pulse 98   Temp 98.8 F (37.1 C)   Wt 208 lb 2 oz (94.4 kg)   SpO2 98%   BMI 29.46 kg/m   Wt Readings from Last 3 Encounters:  10/07/19 208 lb 2 oz (94.4 kg)  03/28/19 202 lb 4 oz (91.7 kg)  01/24/19 211 lb (95.7 kg)    Physical Exam Vitals and nursing note reviewed.  Constitutional:      General: He is not in acute distress.    Appearance: Normal appearance. He is not ill-appearing, toxic-appearing or diaphoretic.  HENT:     Head: Normocephalic and atraumatic.     Right Ear: External ear normal.     Left Ear: External ear normal.     Nose: Nose normal.     Mouth/Throat:     Mouth: Mucous membranes are moist.     Pharynx: Oropharynx is clear.  Eyes:     General: No scleral icterus.       Right eye: No discharge.        Left eye: No discharge.     Extraocular Movements:  Extraocular movements intact.     Conjunctiva/sclera: Conjunctivae normal.     Pupils: Pupils are equal, round, and reactive to light.  Cardiovascular:     Rate and Rhythm: Normal rate and regular rhythm.     Pulses: Normal pulses.     Heart sounds: Normal heart sounds. No murmur heard.  No friction rub. No gallop.   Pulmonary:     Effort: Pulmonary effort is normal. No respiratory distress.     Breath sounds: Normal breath sounds. No stridor. No  wheezing, rhonchi or rales.  Chest:     Chest wall: No tenderness.  Musculoskeletal:        General: Normal range of motion.     Cervical back: Normal range of motion and neck supple.  Skin:    General: Skin is warm and dry.     Capillary Refill: Capillary refill takes less than 2 seconds.     Coloration: Skin is not jaundiced or pale.     Findings: No bruising, erythema, lesion or rash.  Neurological:     General: No focal deficit present.     Mental Status: He is alert and oriented to person, place, and time. Mental status is at baseline.  Psychiatric:        Mood and Affect: Mood normal.        Behavior: Behavior normal.        Thought Content: Thought content normal.        Judgment: Judgment normal.     Results for orders placed or performed in visit on 10/07/19  Bayer DCA Hb A1c Waived  Result Value Ref Range   HB A1C (BAYER DCA - WAIVED) 5.6 <7.0 %  Microalbumin, Urine Waived  Result Value Ref Range   Microalb, Ur Waived 10 0 - 19 mg/L   Creatinine, Urine Waived 200 10 - 300 mg/dL   Microalb/Creat Ratio <30 <30 mg/g      Assessment & Plan:   Problem List Items Addressed This Visit      Cardiovascular and Mediastinum   Hypertension - Primary    Under good control off medicine. Continue to monitor. Call with any concerns. Continue to monitor.       Relevant Orders   Comprehensive metabolic panel   Giant cell arteritis (HCC)    Following with [facility]. Continue to monitor. Call with any concerns.         Endocrine   RESOLVED: Diet-controlled diabetes mellitus (HCC)    Due to chronic prednisone. A1c today normal at 5.6- we will resolve this problem. Continue to monitor.       Relevant Orders   Bayer DCA Hb A1c Waived (Completed)   Comprehensive metabolic panel   Microalbumin, Urine Waived (Completed)     Musculoskeletal and Integument   Seronegative arthritis    Following with [facility]. Continue to monitor. Call with any concerns. Does not need tramadol at this  time. Continue to monitor.         Genitourinary   BPH (benign prostatic hyperplasia)    Under good control off medicine. Continue to monitor. Call with any concerns.       Relevant Orders   Comprehensive metabolic panel   PSA     Other   Hyperlipidemia    Rechecking labs today. Await results. Treat as needed. Call with any concerns.       Relevant Orders   Comprehensive metabolic panel   Lipid Panel w/o Chol/HDL Ratio   Microcytic anemia  Rechecking labs today. Await results. Treat as needed. Call with any concerns.       Relevant Orders   CBC with Differential/Platelet   Comprehensive metabolic panel       Follow up plan: Return in about 6 months (around 04/07/2020) for physical.

## 2019-10-08 ENCOUNTER — Other Ambulatory Visit: Payer: Self-pay | Admitting: Family Medicine

## 2019-10-08 DIAGNOSIS — R972 Elevated prostate specific antigen [PSA]: Secondary | ICD-10-CM

## 2019-10-08 LAB — CBC WITH DIFFERENTIAL/PLATELET
Basophils Absolute: 0 10*3/uL (ref 0.0–0.2)
Basos: 0 %
EOS (ABSOLUTE): 0 10*3/uL (ref 0.0–0.4)
Eos: 0 %
Hematocrit: 45.6 % (ref 37.5–51.0)
Hemoglobin: 15.1 g/dL (ref 13.0–17.7)
Immature Grans (Abs): 0.1 10*3/uL (ref 0.0–0.1)
Immature Granulocytes: 1 %
Lymphocytes Absolute: 0.6 10*3/uL — ABNORMAL LOW (ref 0.7–3.1)
Lymphs: 6 %
MCH: 29.6 pg (ref 26.6–33.0)
MCHC: 33.1 g/dL (ref 31.5–35.7)
MCV: 89 fL (ref 79–97)
Monocytes Absolute: 0.3 10*3/uL (ref 0.1–0.9)
Monocytes: 3 %
Neutrophils Absolute: 7.9 10*3/uL — ABNORMAL HIGH (ref 1.4–7.0)
Neutrophils: 90 %
Platelets: 271 10*3/uL (ref 150–450)
RBC: 5.1 x10E6/uL (ref 4.14–5.80)
RDW: 13.1 % (ref 11.6–15.4)
WBC: 8.8 10*3/uL (ref 3.4–10.8)

## 2019-10-08 LAB — LIPID PANEL W/O CHOL/HDL RATIO
Cholesterol, Total: 193 mg/dL (ref 100–199)
HDL: 50 mg/dL (ref >39)
LDL Chol Calc (NIH): 112 mg/dL — ABNORMAL HIGH (ref 0–99)
Triglycerides: 179 mg/dL — ABNORMAL HIGH (ref 0–149)
VLDL Cholesterol Cal: 31 mg/dL (ref 5–40)

## 2019-10-08 LAB — COMPREHENSIVE METABOLIC PANEL
ALT: 9 IU/L (ref 0–44)
AST: 16 IU/L (ref 0–40)
Albumin/Globulin Ratio: 2.2 (ref 1.2–2.2)
Albumin: 4.4 g/dL (ref 3.8–4.8)
Alkaline Phosphatase: 86 IU/L (ref 48–121)
BUN/Creatinine Ratio: 13 (ref 10–24)
BUN: 13 mg/dL (ref 8–27)
Bilirubin Total: 0.5 mg/dL (ref 0.0–1.2)
CO2: 18 mmol/L — ABNORMAL LOW (ref 20–29)
Calcium: 9.6 mg/dL (ref 8.6–10.2)
Chloride: 103 mmol/L (ref 96–106)
Creatinine, Ser: 0.98 mg/dL (ref 0.76–1.27)
GFR calc Af Amer: 92 mL/min/{1.73_m2} (ref >59)
GFR calc non Af Amer: 79 mL/min/{1.73_m2} (ref >59)
Globulin, Total: 2 g/dL (ref 1.5–4.5)
Glucose: 182 mg/dL — ABNORMAL HIGH (ref 65–99)
Potassium: 4.3 mmol/L (ref 3.5–5.2)
Sodium: 138 mmol/L (ref 134–144)
Total Protein: 6.4 g/dL (ref 6.0–8.5)

## 2019-10-08 LAB — PSA: Prostate Specific Ag, Serum: 4.9 ng/mL — ABNORMAL HIGH (ref 0.0–4.0)

## 2019-10-24 DIAGNOSIS — R7982 Elevated C-reactive protein (CRP): Secondary | ICD-10-CM | POA: Diagnosis not present

## 2019-10-24 DIAGNOSIS — R519 Headache, unspecified: Secondary | ICD-10-CM | POA: Diagnosis not present

## 2019-10-24 DIAGNOSIS — H538 Other visual disturbances: Secondary | ICD-10-CM | POA: Diagnosis not present

## 2019-11-04 DIAGNOSIS — Z6828 Body mass index (BMI) 28.0-28.9, adult: Secondary | ICD-10-CM | POA: Diagnosis not present

## 2019-11-04 DIAGNOSIS — M1990 Unspecified osteoarthritis, unspecified site: Secondary | ICD-10-CM | POA: Diagnosis not present

## 2019-11-04 DIAGNOSIS — Z79899 Other long term (current) drug therapy: Secondary | ICD-10-CM | POA: Diagnosis not present

## 2019-11-07 ENCOUNTER — Other Ambulatory Visit: Payer: Self-pay

## 2019-11-07 ENCOUNTER — Other Ambulatory Visit: Payer: Medicare Other

## 2019-11-07 DIAGNOSIS — R972 Elevated prostate specific antigen [PSA]: Secondary | ICD-10-CM

## 2019-11-08 LAB — PSA: Prostate Specific Ag, Serum: 3.9 ng/mL (ref 0.0–4.0)

## 2020-01-01 ENCOUNTER — Telehealth: Payer: Self-pay | Admitting: Family Medicine

## 2020-01-01 ENCOUNTER — Other Ambulatory Visit: Payer: Self-pay | Admitting: Family Medicine

## 2020-01-01 NOTE — Telephone Encounter (Signed)
Copied from CRM [id number]. Topic: General - Other >> Jan 01, 2020 10:20 AM Marylen Ponto wrote: Reason for CRM: Pt stated he needs a refill for the Rx for cyclobenzaprine that was prescribed by a doctor at urgent care. Pt stated he needs it before going to the chiropractor. Pt requests call back as he declined to schedule an appt.

## 2020-01-01 NOTE — Telephone Encounter (Signed)
Routing to provider to advise.  

## 2020-01-01 NOTE — Telephone Encounter (Signed)
Copied from CRM [id number]. Topic: Quick Communication - Rx Refill/Question >> Jan 01, 2020 10:16 AM Marylen Ponto wrote: Medication: traMADol (ULTRAM) 50 MG tablet   Has the patient contacted their pharmacy? no  Preferred Pharmacy (with phone number or street name): [id number] - [place], [place] - [address] Phone: [phone] Fax: [phone]  Agent: Please be advised that RX refills may take up to 3 business days. We ask that you follow-up with your pharmacy.

## 2020-01-01 NOTE — Telephone Encounter (Signed)
Requested medication (s) are due for refill today: Yes  Requested medication (s) are on the active medication list: Yes  Last refill:  05/30/19  Future visit scheduled: Yes  Notes to clinic:  See request.    Requested Prescriptions  Pending Prescriptions Disp Refills   traMADol (ULTRAM) 50 MG tablet 60 tablet 2    Sig: Take 1 tablet (50 mg total) by mouth 2 (two) times daily.      Not Delegated - Analgesics:  Opioid Agonists Failed - 01/01/2020 10:24 AM      Failed - This refill cannot be delegated      Failed - Urine Drug Screen completed in last 360 days.      Passed - Valid encounter within last 6 months    Recent Outpatient Visits           2 months ago Essential hypertension   [facility] [place], Megan P, DO   7 months ago Seronegative arthritis   [facility] [place], [place], DO   9 months ago Essential hypertension   [facility] [place], [place], DO   10 months ago Seronegative arthritis   [facility] [place], [place], DO   11 months ago Seronegative arthritis   [facility] [place], [place], DO       Future Appointments             In 3 months Johnson, Oralia Rud, DO [employer], [facility]

## 2020-01-02 MED ORDER — CYCLOBENZAPRINE HCL 10 MG PO TABS: 10.0000 mg | ORAL_TABLET | Freq: Every day | ORAL | 0 refills | Status: DC

## 2020-01-06 NOTE — Telephone Encounter (Signed)
Pt stated he did not want a refill at the moment ibuprofen has been working fine and his muscle relaxer have been helping . Pt refused multiple apt times stated he would call if he needs anything.

## 2020-01-06 NOTE — Telephone Encounter (Signed)
Needs appt

## 2020-01-08 DIAGNOSIS — Z79899 Other long term (current) drug therapy: Secondary | ICD-10-CM | POA: Diagnosis not present

## 2020-01-08 DIAGNOSIS — M1990 Unspecified osteoarthritis, unspecified site: Secondary | ICD-10-CM | POA: Diagnosis not present

## 2020-01-21 ENCOUNTER — Telehealth: Payer: Self-pay | Admitting: Family Medicine

## 2020-01-21 NOTE — Telephone Encounter (Signed)
Copied from CRM [id number]. Topic: Medicare AWV >> Jan 21, 2020 10:52 AM Claudette Laws R wrote: Reason for CRM: Left message for patient to call back and schedule the Medicare Annual Wellness Visit (AWV) virtually.  Last AWV 01/21/2019  Please schedule at anytime with [facility] Health Advisor.  45 minute appointment  Any questions, please call me at [phone]

## 2020-02-03 ENCOUNTER — Ambulatory Visit: Payer: Medicare Other

## 2020-02-10 DIAGNOSIS — Z79899 Other long term (current) drug therapy: Secondary | ICD-10-CM | POA: Diagnosis not present

## 2020-02-10 DIAGNOSIS — M1990 Unspecified osteoarthritis, unspecified site: Secondary | ICD-10-CM | POA: Diagnosis not present

## 2020-02-14 ENCOUNTER — Ambulatory Visit (INDEPENDENT_AMBULATORY_CARE_PROVIDER_SITE_OTHER): Payer: Medicare Other

## 2020-02-14 VITALS — Ht 72.0 in | Wt 206.0 lb

## 2020-02-14 DIAGNOSIS — Z0000 Encounter for general adult medical examination without abnormal findings: Secondary | ICD-10-CM | POA: Diagnosis not present

## 2020-02-14 NOTE — Patient Instructions (Signed)
Shane Duncan , Thank you for taking time to come for your Medicare Wellness Visit. I appreciate your ongoing commitment to your health goals. Please review the following plan we discussed and let me know if I can assist you in the future.   Screening recommendations/referrals: Colonoscopy: completed 01/13/2014 Recommended yearly ophthalmology/optometry visit for glaucoma screening and checkup Recommended yearly dental visit for hygiene and checkup  Vaccinations: Influenza vaccine: decline Pneumococcal vaccine: due Tdap vaccine: completed 12/30/2010, due 12/29/2020 Shingles vaccine: discussed   Covid-19:  01/20/2020, 06/18/2019, 05/28/2019  Advanced directives: Please bring a copy of your POA (Power of Attorney) and/or Living Will to your next appointment.   Conditions/risks identified: none  Next appointment: Follow up in one year for your annual wellness visit.   Preventive Care 8 Years and Older, Male Preventive care refers to lifestyle choices and visits with your health care provider that can promote health and wellness. What does preventive care include?  A yearly physical exam. This is also called an annual well check.  Dental exams once or twice a year.  Routine eye exams. Ask your health care provider how often you should have your eyes checked.  Personal lifestyle choices, including:  Daily care of your teeth and gums.  Regular physical activity.  Eating a healthy diet.  Avoiding tobacco and drug use.  Limiting alcohol use.  Practicing safe sex.  Taking low doses of aspirin every day.  Taking vitamin and mineral supplements as recommended by your health care provider. What happens during an annual well check? The services and screenings done by your health care provider during your annual well check will depend on your age, overall health, lifestyle risk factors, and family history of disease. Counseling  Your health care provider may ask you questions about  your:  Alcohol use.  Tobacco use.  Drug use.  Emotional well-being.  Home and relationship well-being.  Sexual activity.  Eating habits.  History of falls.  Memory and ability to understand (cognition).  Work and work Astronomer. Screening  You may have the following tests or measurements:  Height, weight, and BMI.  Blood pressure.  Lipid and cholesterol levels. These may be checked every 5 years, or more frequently if you are over 47 years old.  Skin check.  Lung cancer screening. You may have this screening every year starting at age 67 if you have a 30-pack-year history of smoking and currently smoke or have quit within the past 15 years.  Fecal occult blood test (FOBT) of the stool. You may have this test every year starting at age 44.  Flexible sigmoidoscopy or colonoscopy. You may have a sigmoidoscopy every 5 years or a colonoscopy every 10 years starting at age 42.  Prostate cancer screening. Recommendations will vary depending on your family history and other risks.  Hepatitis C blood test.  Hepatitis B blood test.  Sexually transmitted disease (STD) testing.  Diabetes screening. This is done by checking your blood sugar (glucose) after you have not eaten for a while (fasting). You may have this done every 1-3 years.  Abdominal aortic aneurysm (AAA) screening. You may need this if you are a current or former smoker.  Osteoporosis. You may be screened starting at age 18 if you are at high risk. Talk with your health care provider about your test results, treatment options, and if necessary, the need for more tests. Vaccines  Your health care provider may recommend certain vaccines, such as:  Influenza vaccine. This is recommended every year.  Tetanus, diphtheria, and acellular pertussis (Tdap, Td) vaccine. You may need a Td booster every 10 years.  Zoster vaccine. You may need this after age 3.  Pneumococcal 13-valent conjugate (PCV13) vaccine.  One dose is recommended after age 82.  Pneumococcal polysaccharide (PPSV23) vaccine. One dose is recommended after age 60. Talk to your health care provider about which screenings and vaccines you need and how often you need them. This information is not intended to replace advice given to you by your health care provider. Make sure you discuss any questions you have with your health care provider. Document Released: 05/01/2015 Document Revised: 12/23/2015 Document Reviewed: 02/03/2015 Elsevier Interactive Patient Education  2017 [place] Prevention in the Home Falls can cause injuries. They can happen to people of all ages. There are many things you can do to make your home safe and to help prevent falls. What can I do on the outside of my home?  Regularly fix the edges of walkways and driveways and fix any cracks.  Remove anything that might make you trip as you walk through a door, such as a raised step or threshold.  Trim any bushes or trees on the path to your home.  Use bright outdoor lighting.  Clear any walking paths of anything that might make someone trip, such as rocks or tools.  Regularly check to see if handrails are loose or broken. Make sure that both sides of any steps have handrails.  Any raised decks and porches should have guardrails on the edges.  Have any leaves, snow, or ice cleared regularly.  Use sand or salt on walking paths during winter.  Clean up any spills in your garage right away. This includes oil or grease spills. What can I do in the bathroom?  Use night lights.  Install grab bars by the toilet and in the tub and shower. Do not use towel bars as grab bars.  Use non-skid mats or decals in the tub or shower.  If you need to sit down in the shower, use a plastic, non-slip stool.  Keep the floor dry. Clean up any water that spills on the floor as soon as it happens.  Remove soap buildup in the tub or shower regularly.  Attach bath  mats securely with double-sided non-slip rug tape.  Do not have throw rugs and other things on the floor that can make you trip. What can I do in the bedroom?  Use night lights.  Make sure that you have a light by your bed that is easy to reach.  Do not use any sheets or blankets that are too big for your bed. They should not hang down onto the floor.  Have a firm chair that has side arms. You can use this for support while you get dressed.  Do not have throw rugs and other things on the floor that can make you trip. What can I do in the kitchen?  Clean up any spills right away.  Avoid walking on wet floors.  Keep items that you use a lot in easy-to-reach places.  If you need to reach something above you, use a strong step stool that has a grab bar.  Keep electrical cords out of the way.  Do not use floor polish or wax that makes floors slippery. If you must use wax, use non-skid floor wax.  Do not have throw rugs and other things on the floor that can make you trip. What can I do  with my stairs?  Do not leave any items on the stairs.  Make sure that there are handrails on both sides of the stairs and use them. Fix handrails that are broken or loose. Make sure that handrails are as long as the stairways.  Check any carpeting to make sure that it is firmly attached to the stairs. Fix any carpet that is loose or worn.  Avoid having throw rugs at the top or bottom of the stairs. If you do have throw rugs, attach them to the floor with carpet tape.  Make sure that you have a light switch at the top of the stairs and the bottom of the stairs. If you do not have them, ask someone to add them for you. What else can I do to help prevent falls?  Wear shoes that:  Do not have high heels.  Have rubber bottoms.  Are comfortable and fit you well.  Are closed at the toe. Do not wear sandals.  If you use a stepladder:  Make sure that it is fully opened. Do not climb a closed  stepladder.  Make sure that both sides of the stepladder are locked into place.  Ask someone to hold it for you, if possible.  Clearly mark and make sure that you can see:  Any grab bars or handrails.  First and last steps.  Where the edge of each step is.  Use tools that help you move around (mobility aids) if they are needed. These include:  Canes.  Walkers.  Scooters.  Crutches.  Turn on the lights when you go into a dark area. Replace any light bulbs as soon as they burn out.  Set up your furniture so you have a clear path. Avoid moving your furniture around.  If any of your floors are uneven, fix them.  If there are any pets around you, be aware of where they are.  Review your medicines with your doctor. Some medicines can make you feel dizzy. This can increase your chance of falling. Ask your doctor what other things that you can do to help prevent falls. This information is not intended to replace advice given to you by your health care provider. Make sure you discuss any questions you have with your health care provider. Document Released: 01/29/2009 Document Revised: 09/10/2015 Document Reviewed: 05/09/2014 Elsevier Interactive Patient Education  2017 [employer].

## 2020-02-14 NOTE — Progress Notes (Signed)
I connected with [name] today by telephone and verified that I am speaking with the correct person using two identifiers. Location patient: home Location provider: work Persons participating in the virtual visit: [name], Elisha Ponder LPN.   I discussed the limitations, risks, security and privacy concerns of performing an evaluation and management service by telephone and the availability of in person appointments. I also discussed with the patient that there may be a patient responsible charge related to this service. The patient expressed understanding and verbally consented to this telephonic visit.    Interactive audio and video telecommunications were attempted between this provider and patient, however failed, due to patient having technical difficulties OR patient did not have access to video capability.  We continued and completed visit with audio only.     Vital signs may be patient reported or missing.  Subjective:   Shane Duncan is a 69 y.o. male who presents for Medicare Annual/Subsequent preventive examination.  Review of Systems     Cardiac Risk Factors include: advanced age (>32men, >47 women);hypertension;male gender     Objective:    Today's Vitals   02/14/20 0941  Weight: 206 lb (93.4 kg)  Height: 6' (1.829 m)   Body mass index is 27.94 kg/m.  Advanced Directives 02/14/2020 01/21/2019 06/11/2018 02/02/2018  Does Patient Have a Medical Advance Directive? Yes Yes Yes Yes  Type of Estate agent of [place];Living will Living will;Healthcare Power of Attorney Living will;Healthcare Power of [employer] Power of [place];Living will  Does patient want to make changes to medical advance directive? - - No - Patient declined -  Copy of Healthcare Power of Attorney in Chart? No - copy requested No - copy requested No - copy requested No - copy requested    Current Medications (verified) Outpatient Encounter Medications  as of 02/14/2020  Medication Sig  . Cholecalciferol (VITAMIN D3) 50 MCG (2000 UT) capsule Take by mouth.  . folic acid (FOLVITE) 1 MG tablet Take 1 mg by mouth daily.  Marland Kitchen ibuprofen (ADVIL) 200 MG tablet Take 200 mg by mouth every 8 (eight) hours as needed.  . methotrexate (RHEUMATREX) 2.5 MG tablet Take 20 mg by mouth once a week.   . predniSONE (STERAPRED UNI-PAK 21 TAB) 10 MG (21) TBPK tablet Take by mouth daily. Take 60 mg for 1 week, 50 mg for 2 weeks, 40 mg the next until completed  . cyclobenzaprine (FLEXERIL) 10 MG tablet Take 1 tablet (10 mg total) by mouth at bedtime. (Patient not taking: Reported on 02/14/2020)  . Multiple Vitamin (MULTIVITAMIN) capsule Take 1 capsule by mouth daily. (Patient not taking: Reported on 02/14/2020)  . traMADol (ULTRAM) 50 MG tablet Take 1 tablet (50 mg total) by mouth 2 (two) times daily. (Patient not taking: Reported on 02/14/2020)   No facility-administered encounter medications on file as of 02/14/2020.    Allergies (verified) Azathioprine, Lactose, and Penicillins   History: Past Medical History:  Diagnosis Date  . Bronchitis   . Chronic sinusitis   . Esophagitis   . Hemorrhoids   . Hepatitis A   . Hyperlipidemia   . Hypertension   . IFG (impaired fasting glucose)   . Temporal arteritis (HCC) 01/2018   Past Surgical History:  Procedure Laterality Date  . ACHILLES TENDON SURGERY Right 1997  . ANUS SURGERY  1990's   anal fistula repair  . ARTERY BIOPSY Left 02/05/2018   Procedure: BIOPSY TEMPORAL ARTERY;  Surgeon: Annice Needy, MD;  Location: [facility]  ORS;  Service: Vascular;  Laterality: Left;  . TONSILLECTOMY  1961   Family History  Problem Relation Age of Onset  . Cancer Father        Colon  . Hypertension Father   . Hyperlipidemia Father   . Ulcerative colitis Son    Social History   Socioeconomic History  . Marital status: Married    Spouse name: Not on file  . Number of children: Not on file  . Years of education: Not on  file  . Highest education level: Associate degree: academic program  Occupational History  . Occupation: retired   Tobacco Use  . Smoking status: Never Smoker  . Smokeless tobacco: Never Used  Vaping Use  . Vaping Use: Never used  Substance and Sexual Activity  . Alcohol use: Yes    Comment: occassional   . Drug use: Yes    Types: Marijuana    Comment: daily   . Sexual activity: Yes  Other Topics Concern  . Not on file  Social History Narrative  . Not on file   Social Determinants of Health   Financial Resource Strain: Low Risk   . Difficulty of Paying Living Expenses: Not hard at all  Food Insecurity: No Food Insecurity  . Worried About Programme researcher, broadcasting/film/videounning Out of Food in the Last Year: Never true  . Ran Out of Food in the Last Year: Never true  Transportation Needs: No Transportation Needs  . Lack of Transportation (Medical): No  . Lack of Transportation (Non-Medical): No  Physical Activity: Sufficiently Active  . Days of Exercise per Week: 5 days  . Minutes of Exercise per Session: 120 min  Stress: No Stress Concern Present  . Feeling of Stress : Not at all  Social Connections:   . Frequency of Communication with Friends and Family: Not on file  . Frequency of Social Gatherings with Friends and Family: Not on file  . Attends Religious Services: Not on file  . Active Member of Clubs or Organizations: Not on file  . Attends BankerClub or Organization Meetings: Not on file  . Marital Status: Not on file    Tobacco Counseling Counseling given: Not Answered   Clinical Intake:  Pre-visit preparation completed: Yes  Pain : No/denies pain     Nutritional Status: BMI 25 -29 Overweight Nutritional Risks: None Diabetes: No  How often do you need to have someone help you when you read instructions, pamphlets, or other written materials from your doctor or pharmacy?: 1 - Never What is the last grade level you completed in school?: some college  Diabetic? no  Interpreter Needed?:  No  Information entered by :: NAllen LPN   Activities of Daily Living In your present state of health, do you have any difficulty performing the following activities: 02/14/2020  Hearing? Y  Comment slightly decreased  Vision? N  Difficulty concentrating or making decisions? Y  Walking or climbing stairs? N  Dressing or bathing? N  Doing errands, shopping? N  Preparing Food and eating ? N  Using the Toilet? N  In the past six months, have you accidently leaked urine? N  Do you have problems with loss of bowel control? N  Managing your Medications? N  Managing your Finances? N  Housekeeping or managing your Housekeeping? N  Some recent data might be hidden    Patient Care Team: Dorcas CarrowJohnson, Megan P, DO as PCP - General (Family Medicine)  Indicate any recent Medical Services you may have received from other than  [facility] providers in the past year (date may be approximate).     Assessment:   This is a routine wellness examination for Shane Duncan.  Hearing/Vision screen  Hearing Screening   125Hz  250Hz  500Hz  1000Hz  2000Hz  3000Hz  4000Hz  6000Hz  8000Hz   Right ear:           Left ear:           Vision Screening Comments: Regular eye exams, Dr.  Dietary issues and exercise activities discussed: Current Exercise Habits: Home exercise routine, Type of exercise: Other - see comments (yard work, ), Time (Minutes): > 60, Frequency (Times/Week): 5, Weekly Exercise (Minutes/Week): 0  Goals    . Patient Stated     02/14/2020, wants to weigh 200 pounds      Depression Screen PHQ 2/9 Scores 02/14/2020 03/28/2019 01/21/2019 01/15/2019 01/03/2018  PHQ - 2 Score 0 0 0 0 0    Fall Risk Fall Risk  02/14/2020 03/28/2019 01/21/2019 01/03/2018  Falls in the past year? 0 0 0 No  Number falls in past yr: - 0 0 -  Injury with Fall? - 0 0 -  Risk for fall due to : Medication side effect - - -  Follow up Falls evaluation completed;Education provided;Falls prevention discussed - - -     Any stairs in or around the home? Yes  If so, are there any without handrails? No  Home free of loose throw rugs in walkways, pet beds, electrical cords, etc? Yes  Adequate lighting in your home to reduce risk of falls? Yes   ASSISTIVE DEVICES UTILIZED TO PREVENT FALLS:  Life alert? No  Use of a cane, walker or w/c? No  Grab bars in the bathroom? No  Shower chair or bench in shower? No  Elevated toilet seat or a handicapped toilet? Yes   TIMED UP AND GO:  Was the test performed? No .     Cognitive Function:     6CIT Screen 02/14/2020  What Year? 0 points  What month? 0 points  What time? 0 points  Count back from 20 0 points  Months in reverse 0 points  Repeat phrase 0 points  Total Score 0    Immunizations Immunization History  Administered Date(s) Administered  . Fluad Quad(high Dose 65+) 01/16/2019  . PFIZER SARS-COV-2 Vaccination 05/28/2019, 06/18/2019  . Pneumococcal Conjugate-13 01/16/2019  . Tdap 12/30/2010    TDAP status: Up to date Flu Vaccine status: Declined, Education has been provided regarding the importance of this vaccine but patient still declined. Advised may receive this vaccine at local pharmacy or Health Dept. Aware to provide a copy of the vaccination record if obtained from local pharmacy or Health Dept. Verbalized acceptance and understanding. Pneumococcal vaccine status: due Covid-19 vaccine status: Completed vaccines  Qualifies for Shingles Vaccine? Yes   Zostavax completed No   Shingrix Completed?: No.    Education has been provided regarding the importance of this vaccine. Patient has been advised to call insurance company to determine out of pocket expense if they have not yet received this vaccine. Advised may also receive vaccine at local pharmacy or Health Dept. Verbalized acceptance and understanding.  Screening Tests Health Maintenance  Topic Date Due  . PNA vac Low Risk Adult (2 of 2 - PPSV23) 01/16/2020  . OPHTHALMOLOGY  EXAM  02/14/2020 (Originally 08/27/1960)  . INFLUENZA VACCINE  07/16/2020 (Originally 11/17/2019)  . FOOT EXAM  02/13/2021 (Originally 09/25/2019)  . COLONOSCOPY  02/13/2021 (Originally 01/10/2019)  . HEMOGLOBIN A1C  04/07/2020  .  URINE MICROALBUMIN  10/06/2020  . TETANUS/TDAP  12/29/2020  . COVID-19 Vaccine  Completed  . Hepatitis C Screening  Completed    Health Maintenance  Health Maintenance Due  Topic Date Due  . PNA vac Low Risk Adult (2 of 2 - PPSV23) 01/16/2020    Colorectal cancer screening: Completed 01/13/2014 . Repeat every 10 years per patient  Lung Cancer Screening: (Low Dose CT Chest recommended if Age 68-80 years, 30 pack-year currently smoking OR have quit w/in 15years.) does not qualify.   Lung Cancer Screening Referral: no  Additional Screening:  Hepatitis C Screening: does qualify; Completed 03/28/2019  Vision Screening: Recommended annual ophthalmology exams for early detection of glaucoma and other disorders of the eye. Is the patient up to date with their annual eye exam?  Yes  Who is the provider or what is the name of the office in which the patient attends annual eye exams? Dr. Clydene Pugh If pt is not established with a provider, would they like to be referred to a provider to establish care? No .   Dental Screening: Recommended annual dental exams for proper oral hygiene  Community Resource Referral / Chronic Care Management: CRR required this visit?  No   CCM required this visit?  No      Plan:     I have personally reviewed and noted the following in the patient's chart:   . Medical and social history . Use of alcohol, tobacco or illicit drugs  . Current medications and supplements . Functional ability and status . Nutritional status . Physical activity . Advanced directives . List of other physicians . Hospitalizations, surgeries, and ER visits in previous 12 months . Vitals . Screenings to include cognitive, depression, and  falls . Referrals and appointments  In addition, I have reviewed and discussed with patient certain preventive protocols, quality metrics, and best practice recommendations. A written personalized care plan for preventive services as well as general preventive health recommendations were provided to patient.     Barb Merino, LPN   [id number]   Nurse Notes:

## 2020-04-01 DIAGNOSIS — Z0000 Encounter for general adult medical examination without abnormal findings: Secondary | ICD-10-CM | POA: Diagnosis not present

## 2020-04-01 DIAGNOSIS — H538 Other visual disturbances: Secondary | ICD-10-CM | POA: Diagnosis not present

## 2020-04-01 DIAGNOSIS — Z79899 Other long term (current) drug therapy: Secondary | ICD-10-CM | POA: Diagnosis not present

## 2020-04-01 DIAGNOSIS — M1990 Unspecified osteoarthritis, unspecified site: Secondary | ICD-10-CM | POA: Diagnosis not present

## 2020-04-09 ENCOUNTER — Ambulatory Visit (INDEPENDENT_AMBULATORY_CARE_PROVIDER_SITE_OTHER): Payer: Medicare Other | Admitting: Family Medicine

## 2020-04-09 ENCOUNTER — Encounter: Payer: Self-pay | Admitting: Family Medicine

## 2020-04-09 ENCOUNTER — Other Ambulatory Visit: Payer: Self-pay

## 2020-04-09 VITALS — BP 136/76 | HR 75 | Temp 97.5°F | Ht 72.0 in | Wt 212.6 lb

## 2020-04-09 DIAGNOSIS — M1380 Other specified arthritis, unspecified site: Secondary | ICD-10-CM | POA: Diagnosis not present

## 2020-04-09 DIAGNOSIS — N401 Enlarged prostate with lower urinary tract symptoms: Secondary | ICD-10-CM

## 2020-04-09 DIAGNOSIS — Z23 Encounter for immunization: Secondary | ICD-10-CM

## 2020-04-09 DIAGNOSIS — E785 Hyperlipidemia, unspecified: Secondary | ICD-10-CM | POA: Diagnosis not present

## 2020-04-09 DIAGNOSIS — R739 Hyperglycemia, unspecified: Secondary | ICD-10-CM

## 2020-04-09 DIAGNOSIS — M316 Other giant cell arteritis: Secondary | ICD-10-CM | POA: Diagnosis not present

## 2020-04-09 DIAGNOSIS — R972 Elevated prostate specific antigen [PSA]: Secondary | ICD-10-CM | POA: Diagnosis not present

## 2020-04-09 DIAGNOSIS — I10 Essential (primary) hypertension: Secondary | ICD-10-CM

## 2020-04-09 DIAGNOSIS — R351 Nocturia: Secondary | ICD-10-CM

## 2020-04-09 LAB — URINALYSIS, ROUTINE W REFLEX MICROSCOPIC
Bilirubin, UA: NEGATIVE
Glucose, UA: NEGATIVE
Ketones, UA: NEGATIVE
Leukocytes,UA: NEGATIVE
Nitrite, UA: NEGATIVE
Protein,UA: NEGATIVE
Specific Gravity, UA: 1.025 (ref 1.005–1.030)
Urobilinogen, Ur: 0.2 mg/dL (ref 0.2–1.0)
pH, UA: 5 (ref 5.0–7.5)

## 2020-04-09 LAB — MICROSCOPIC EXAMINATION
Bacteria, UA: NONE SEEN
WBC, UA: NONE SEEN /hpf (ref 0–5)

## 2020-04-09 LAB — BAYER DCA HB A1C WAIVED: HB A1C (BAYER DCA - WAIVED): 5.2 % (ref <7.0)

## 2020-04-09 LAB — MICROALBUMIN, URINE WAIVED
Creatinine, Urine Waived: 200 mg/dL (ref 10–300)
Microalb, Ur Waived: 10 mg/L (ref 0–19)
Microalb/Creat Ratio: <30 mg/g (ref <30)

## 2020-04-09 NOTE — Progress Notes (Signed)
BP 136/76   Pulse 75   Temp (!) 97.5 F (36.4 C)   Ht 6' (1.829 m)   Wt 212 lb 9.6 oz (96.4 kg)   SpO2 96%   BMI 28.83 kg/m    Subjective:    Patient ID: Shane Duncan, male    DOB: 01-29-1951, 69 y.o.   MRN: [id number]  HPI: Shane Duncan is a 69 y.o. male presenting on 04/09/2020 for comprehensive medical examination. Current medical complaints include:  HYPERTENSION / HYPERLIPIDEMIA Satisfied with current treatment? yes Duration of hypertension: chronic BP monitoring frequency: not checking BP medication side effects: no Past BP meds: none Duration of hyperlipidemia: chronic Cholesterol medication side effects: not on anything Cholesterol supplements: none Past cholesterol medications: none Aspirin: no Recent stressors: no Recurrent headaches: no Visual changes: no Palpitations: no Dyspnea: no Chest pain: no Lower extremity edema: no Dizzy/lightheaded: no  BPH BPH status: controlled Satisfied with current treatment?: yes Medication side effects: no Medication compliance: excellent compliance Duration: chronic Nocturia: no Urinary frequency:no Incomplete voiding: no Urgency: no Weak urinary stream: no Straining to start stream: no Dysuria: no Onset: gradual Severity: severe  Interim Problems from his last visit: no  Depression Screen done today and results listed below:  Depression screen [facility] 2/9 04/09/2020 02/14/2020 03/28/2019 01/21/2019 01/15/2019  Decreased Interest 0 0 0 0 0  Down, Depressed, Hopeless 0 0 0 0 0  PHQ - 2 Score 0 0 0 0 0    Past Medical History:  Past Medical History:  Diagnosis Date  . Bronchitis   . Chronic sinusitis   . Esophagitis   . Hemorrhoids   . Hepatitis A   . Hyperlipidemia   . Hypertension   . IFG (impaired fasting glucose)   . Temporal arteritis (HCC) 01/2018    Surgical History:  Past Surgical History:  Procedure Laterality Date  . ACHILLES TENDON SURGERY Right 1997  . ANUS SURGERY  1990's    anal fistula repair  . ARTERY BIOPSY Left 02/05/2018   Procedure: BIOPSY TEMPORAL ARTERY;  Surgeon: Annice Needy, MD;  Location: [facility] ORS;  Service: Vascular;  Laterality: Left;  . TONSILLECTOMY  1961    Medications:  Current Outpatient Medications on File Prior to Visit  Medication Sig  . Cholecalciferol (VITAMIN D3) 50 MCG (2000 UT) capsule Take by mouth.  . folic acid (FOLVITE) 1 MG tablet Take 1 mg by mouth daily.  . methotrexate (RHEUMATREX) 2.5 MG tablet Take 20 mg by mouth once a week.   . predniSONE (STERAPRED UNI-PAK 21 TAB) 10 MG (21) TBPK tablet Take by mouth daily. Take 60 mg for 1 week, 50 mg for 2 weeks, 40 mg the next until completed  . zinc gluconate 50 MG tablet Take by mouth.   No current facility-administered medications on file prior to visit.    Allergies:  Allergies  Allergen Reactions  . Azathioprine Rash  . Lactose Other (See Comments)    Lactose intolerant  . Penicillins Rash    Social History:  Social History   Socioeconomic History  . Marital status: Married    Spouse name: Not on file  . Number of children: Not on file  . Years of education: Not on file  . Highest education level: Associate degree: academic program  Occupational History  . Occupation: retired   Tobacco Use  . Smoking status: Never Smoker  . Smokeless tobacco: Never Used  Vaping Use  . Vaping Use: Never used  Substance and Sexual Activity  .  Alcohol use: Yes    Comment: occassional   . Drug use: Yes    Types: Marijuana    Comment: daily   . Sexual activity: Yes  Other Topics Concern  . Not on file  Social History Narrative  . Not on file   Social Determinants of Health   Financial Resource Strain: Low Risk   . Difficulty of Paying Living Expenses: Not hard at all  Food Insecurity: No Food Insecurity  . Worried About Programme researcher, broadcasting/film/video in the Last Year: Never true  . Ran Out of Food in the Last Year: Never true  Transportation Needs: No Transportation Needs  .  Lack of Transportation (Medical): No  . Lack of Transportation (Non-Medical): No  Physical Activity: Sufficiently Active  . Days of Exercise per Week: 5 days  . Minutes of Exercise per Session: 120 min  Stress: No Stress Concern Present  . Feeling of Stress : Not at all  Social Connections: Not on file  Intimate Partner Violence: Not on file   Social History   Tobacco Use  Smoking Status Never Smoker  Smokeless Tobacco Never Used   Social History   Substance and Sexual Activity  Alcohol Use Yes   Comment: occassional     Family History:  Family History  Problem Relation Age of Onset  . Cancer Father        Colon  . Hypertension Father   . Hyperlipidemia Father   . Ulcerative colitis Son     Past medical history, surgical history, medications, allergies, family history and social history reviewed with patient today and changes made to appropriate areas of the chart.   Review of Systems  Constitutional: Negative.   HENT: Negative.   Eyes: Negative.   Respiratory: Negative.   Cardiovascular: Negative.   Gastrointestinal: Negative.   Genitourinary: Negative.   Musculoskeletal: Positive for neck pain. Negative for back pain, falls, joint pain and myalgias.  Skin: Negative.   Neurological: Negative.   Endo/Heme/Allergies: Negative.   Psychiatric/Behavioral: Negative.     All other ROS negative except what is listed above and in the HPI.      Objective:    BP 136/76   Pulse 75   Temp (!) 97.5 F (36.4 C)   Ht 6' (1.829 m)   Wt 212 lb 9.6 oz (96.4 kg)   SpO2 96%   BMI 28.83 kg/m   Wt Readings from Last 3 Encounters:  04/09/20 212 lb 9.6 oz (96.4 kg)  02/14/20 206 lb (93.4 kg)  10/07/19 208 lb 2 oz (94.4 kg)    Physical Exam Vitals and nursing note reviewed.  Constitutional:      General: He is not in acute distress.    Appearance: Normal appearance. He is normal weight. He is not ill-appearing, toxic-appearing or diaphoretic.  HENT:     Head:  Normocephalic and atraumatic.     Right Ear: Tympanic membrane, ear canal and external ear normal. There is no impacted cerumen.     Left Ear: Tympanic membrane, ear canal and external ear normal. There is no impacted cerumen.     Nose: Nose normal. No congestion or rhinorrhea.     Mouth/Throat:     Mouth: Mucous membranes are moist.     Pharynx: Oropharynx is clear. No oropharyngeal exudate or posterior oropharyngeal erythema.  Eyes:     General: No scleral icterus.       Right eye: No discharge.        Left eye:  No discharge.     Extraocular Movements: Extraocular movements intact.     Conjunctiva/sclera: Conjunctivae normal.     Pupils: Pupils are equal, round, and reactive to light.  Neck:     Vascular: No carotid bruit.  Cardiovascular:     Rate and Rhythm: Normal rate and regular rhythm.     Pulses: Normal pulses.     Heart sounds: No murmur heard. No friction rub. No gallop.   Pulmonary:     Effort: Pulmonary effort is normal. No respiratory distress.     Breath sounds: Normal breath sounds. No stridor. No wheezing, rhonchi or rales.  Chest:     Chest wall: No tenderness.  Abdominal:     General: Abdomen is flat. Bowel sounds are normal. There is no distension.     Palpations: Abdomen is soft. There is no mass.     Tenderness: There is no abdominal tenderness. There is no right CVA tenderness, left CVA tenderness, guarding or rebound.     Hernia: No hernia is present.  Genitourinary:    Comments: Genital exam deferred with shared decision making Musculoskeletal:        General: No swelling, tenderness, deformity or signs of injury.     Cervical back: Normal range of motion and neck supple. No rigidity. No muscular tenderness.     Right lower leg: No edema.     Left lower leg: No edema.  Lymphadenopathy:     Cervical: No cervical adenopathy.  Skin:    General: Skin is warm and dry.     Capillary Refill: Capillary refill takes less than 2 seconds.     Coloration: Skin  is not jaundiced or pale.     Findings: No bruising, erythema, lesion or rash.  Neurological:     General: No focal deficit present.     Mental Status: He is alert and oriented to person, place, and time.     Cranial Nerves: No cranial nerve deficit.     Sensory: No sensory deficit.     Motor: No weakness.     Coordination: Coordination normal.     Gait: Gait normal.     Deep Tendon Reflexes: Reflexes normal.  Psychiatric:        Mood and Affect: Mood normal.        Behavior: Behavior normal.        Thought Content: Thought content normal.        Judgment: Judgment normal.     Results for orders placed or performed in visit on 11/07/19  PSA  Result Value Ref Range   Prostate Specific Ag, Serum 3.9 0.0 - 4.0 ng/mL      Assessment & Plan:   Problem List Items Addressed This Visit      Cardiovascular and Mediastinum   Hypertension    Doing well off medicine. Continue to monitor. Call with any concerns. Continue to monitor. Call with any concerns. Rechecking labs today. Await results.       Relevant Orders   CBC with Differential/Platelet   Comprehensive metabolic panel   Microalbumin, Urine Waived   TSH   Urinalysis, Routine w reflex microscopic   Giant cell arteritis (HCC)    Continues to follow with neurology. Call with any concerns. Doing much better.        Musculoskeletal and Integument   Seronegative arthritis    Continues to follow with neurology. Call with any concerns. Doing much better.        Genitourinary  BPH (benign prostatic hyperplasia) - Primary    Rechecking labs today. Await results. Treat as needed.       Relevant Orders   CBC with Differential/Platelet   Comprehensive metabolic panel   PSA     Other   Hyperlipidemia    Doing well off medicine. Continue to monitor. Call with any concerns. Continue to monitor. Call with any concerns. Rechecking labs today. Await results.       Relevant Orders   CBC with Differential/Platelet    Comprehensive metabolic panel   Lipid Panel w/o Chol/HDL Ratio    Other Visit Diagnoses    Hyperglycemia       Rechecking labs today. Await results.    Relevant Orders   Bayer DCA Hb A1c Waived   Urinalysis, Routine w reflex microscopic   Elevated PSA       Rechecking labs today. Await results.    Relevant Orders   CBC with Differential/Platelet   Comprehensive metabolic panel   PSA       Discussed aspirin prophylaxis for myocardial infarction prevention and decision was made to continue ASA  LABORATORY TESTING:  Health maintenance labs ordered today as discussed above.   The natural history of prostate cancer and ongoing controversy regarding screening and potential treatment outcomes of prostate cancer has been discussed with the patient. The meaning of a false positive PSA and a false negative PSA has been discussed. He indicates understanding of the limitations of this screening test and wishes to proceed with screening PSA testing.   IMMUNIZATIONS:   - Tdap: Tetanus vaccination status reviewed: last tetanus booster within 10 years. - Influenza: Refused - Pneumovax: Administered today - Prevnar: Up to date  SCREENING: - Colonoscopy: Up to date  Discussed with patient purpose of the colonoscopy is to detect colon cancer at curable precancerous or early stages   PATIENT COUNSELING:    Sexuality: Discussed sexually transmitted diseases, partner selection, use of condoms, avoidance of unintended pregnancy  and contraceptive alternatives.   Advised to avoid cigarette smoking.  I discussed with the patient that most people either abstain from alcohol or drink within safe limits (<=14/week and <=4 drinks/occasion for males, <=7/weeks and <= 3 drinks/occasion for females) and that the risk for alcohol disorders and other health effects rises proportionally with the number of drinks per week and how often a drinker exceeds daily limits.  Discussed cessation/primary prevention  of drug use and availability of treatment for abuse.   Diet: Encouraged to adjust caloric intake to maintain  or achieve ideal body weight, to reduce intake of dietary saturated fat and total fat, to limit sodium intake by avoiding high sodium foods and not adding table salt, and to maintain adequate dietary potassium and calcium preferably from fresh fruits, vegetables, and low-fat dairy products.    stressed the importance of regular exercise  Injury prevention: Discussed safety belts, safety helmets, smoke detector, smoking near bedding or upholstery.   Dental health: Discussed importance of regular tooth brushing, flossing, and dental visits.   Follow up plan: NEXT PREVENTATIVE PHYSICAL DUE IN 1 YEAR. Return in about 6 months (around 10/08/2020).

## 2020-04-09 NOTE — Assessment & Plan Note (Signed)
Doing well off medicine. Continue to monitor. Call with any concerns. Continue to monitor. Call with any concerns. Rechecking labs today. Await results.

## 2020-04-09 NOTE — Assessment & Plan Note (Signed)
Continues to follow with neurology. Call with any concerns. Doing much better.

## 2020-04-09 NOTE — Assessment & Plan Note (Addendum)
Rechecking labs today. Await results. Treat as needed.  °

## 2020-04-09 NOTE — Patient Instructions (Signed)
Health Maintenance After Age 67 After age 67, you are at a higher risk for certain long-term diseases and infections as well as injuries from falls. Falls are a major cause of broken bones and head injuries in people who are older than age 67. Getting regular preventive care can help to keep you healthy and well. Preventive care includes getting regular testing and making lifestyle changes as recommended by your health care provider. Talk with your health care provider about:  Which screenings and tests you should have. A screening is a test that checks for a disease when you have no symptoms.  A diet and exercise plan that is right for you. What should I know about screenings and tests to prevent falls? Screening and testing are the best ways to find a health problem early. Early diagnosis and treatment give you the best chance of managing medical conditions that are common after age 67. Certain conditions and lifestyle choices may make you more likely to have a fall. Your health care provider may recommend:  Regular vision checks. Poor vision and conditions such as cataracts can make you more likely to have a fall. If you wear glasses, make sure to get your prescription updated if your vision changes.  Medicine review. Work with your health care provider to regularly review all of the medicines you are taking, including over-the-counter medicines. Ask your health care provider about any side effects that may make you more likely to have a fall. Tell your health care provider if any medicines that you take make you feel dizzy or sleepy.  Osteoporosis screening. Osteoporosis is a condition that causes the bones to get weaker. This can make the bones weak and cause them to break more easily.  Blood pressure screening. Blood pressure changes and medicines to control blood pressure can make you feel dizzy.  Strength and balance checks. Your health care provider may recommend certain tests to check your  strength and balance while standing, walking, or changing positions.  Foot health exam. Foot pain and numbness, as well as not wearing proper footwear, can make you more likely to have a fall.  Depression screening. You may be more likely to have a fall if you have a fear of falling, feel emotionally low, or feel unable to do activities that you used to do.  Alcohol use screening. Using too much alcohol can affect your balance and may make you more likely to have a fall. What actions can I take to lower my risk of falls? General instructions  Talk with your health care provider about your risks for falling. Tell your health care provider if: ? You fall. Be sure to tell your health care provider about all falls, even ones that seem minor. ? You feel dizzy, sleepy, or off-balance.  Take over-the-counter and prescription medicines only as told by your health care provider. These include any supplements.  Eat a healthy diet and maintain a healthy weight. A healthy diet includes low-fat dairy products, low-fat (lean) meats, and fiber from whole grains, beans, and lots of fruits and vegetables. Home safety  Remove any tripping hazards, such as rugs, cords, and clutter.  Install safety equipment such as grab bars in bathrooms and safety rails on stairs.  Keep rooms and walkways well-lit. Activity   Follow a regular exercise program to stay fit. This will help you maintain your balance. Ask your health care provider what types of exercise are appropriate for you.  If you need a cane or   walker, use it as recommended by your health care provider.  Wear supportive shoes that have nonskid soles. Lifestyle  Do not drink alcohol if your health care provider tells you not to drink.  If you drink alcohol, limit how much you have: ? 0-1 drink a day for women. ? 0-2 drinks a day for men.  Be aware of how much alcohol is in your drink. In the [place] one drink equals one typical bottle of beer (12  oz), one-half glass of wine (5 oz), or one shot of hard liquor (1 oz).  Do not use any products that contain nicotine or tobacco, such as cigarettes and e-cigarettes. If you need help quitting, ask your health care provider. Summary  Having a healthy lifestyle and getting preventive care can help to protect your health and wellness after age 67.  Screening and testing are the best way to find a health problem early and help you avoid having a fall. Early diagnosis and treatment give you the best chance for managing medical conditions that are more common for people who are older than age 67.  Falls are a major cause of broken bones and head injuries in people who are older than age 67. Take precautions to prevent a fall at home.  Work with your health care provider to learn what changes you can make to improve your health and wellness and to prevent falls. This information is not intended to replace advice given to you by your health care provider. Make sure you discuss any questions you have with your health care provider. Document Revised: 07/26/2018 Document Reviewed: 02/15/2017 Elsevier Patient Education  2020 [employer] Pneumococcal Polysaccharide Vaccine (PPSV23): What You Need to Know 1. Why get vaccinated? Pneumococcal polysaccharide vaccine (PPSV23) can prevent pneumococcal disease. Pneumococcal disease refers to any illness caused by pneumococcal bacteria. These bacteria can cause many types of illnesses, including pneumonia, which is an infection of the lungs. Pneumococcal bacteria are one of the most common causes of pneumonia. Besides pneumonia, pneumococcal bacteria can also cause:  Ear infections  Sinus infections  Meningitis (infection of the tissue covering the brain and spinal cord)  Bacteremia (bloodstream infection) Anyone can get pneumococcal disease, but children under 2 years of age, people with certain medical conditions, adults 65 years or older, and  cigarette smokers are at the highest risk. Most pneumococcal infections are mild. However, some can result in long-term problems, such as brain damage or hearing loss. Meningitis, bacteremia, and pneumonia caused by pneumococcal disease can be fatal. 2. PPSV23 PPSV23 protects against 23 types of bacteria that cause pneumococcal disease. PPSV23 is recommended for:  All adults 65 years or older,  Anyone 2 years or older with certain medical conditions that can lead to an increased risk for pneumococcal disease. Most people need only one dose of PPSV23. A second dose of PPSV23, and another type of pneumococcal vaccine called PCV13, are recommended for certain high-risk groups. Your health care provider can give you more information. People 65 years or older should get a dose of PPSV23 even if they have already gotten one or more doses of the vaccine before they turned 65. 3. Talk with your health care provider Tell your vaccine provider if the person getting the vaccine:  Has had an allergic reaction after a previous dose of PPSV23, or has any severe, life-threatening allergies. In some cases, your health care provider may decide to postpone PPSV23 vaccination to a future visit. People with minor illnesses, such as a   cold, may be vaccinated. People who are moderately or severely ill should usually wait until they recover before getting PPSV23. Your health care provider can give you more information. 4. Risks of a vaccine reaction  Redness or pain where the shot is given, feeling tired, fever, or muscle aches can happen after PPSV23. People sometimes faint after medical procedures, including vaccination. Tell your provider if you feel dizzy or have vision changes or ringing in the ears. As with any medicine, there is a very remote chance of a vaccine causing a severe allergic reaction, other serious injury, or death. 5. What if there is a serious problem? An allergic reaction could occur after  the vaccinated person leaves the clinic. If you see signs of a severe allergic reaction (hives, swelling of the face and throat, difficulty breathing, a fast heartbeat, dizziness, or weakness), call 9-1-1 and get the person to the nearest hospital. For other signs that concern you, call your health care provider. Adverse reactions should be reported to the Vaccine Adverse Event Reporting System (VAERS). Your health care provider will usually file this report, or you can do it yourself. Visit the VAERS website at www.vaers.hhs.gov or call 1-800-822-7967. VAERS is only for reporting reactions, and VAERS staff do not give medical advice. 6. How can I learn more?  Ask your health care provider.  Call your local or state health department.  Contact the Centers for Disease Control and Prevention (CDC): ? Call 1-800-232-4636 (1-800-CDC-INFO) or ? Visit CDC's website at www.cdc.gov/vaccines CDC Vaccine Information Statement PPSV23 Vaccine (02/14/2018) This information is not intended to replace advice given to you by your health care provider. Make sure you discuss any questions you have with your health care provider. Document Revised: 07/24/2018 Document Reviewed: 11/14/2017 Elsevier Patient Education  2020 Elsevier Inc.  

## 2020-04-09 NOTE — Assessment & Plan Note (Signed)
Doing well off medicine. Continue to monitor. Call with any concerns. Continue to monitor. Call with any concerns. Rechecking labs today. Await results.  

## 2020-04-09 NOTE — Assessment & Plan Note (Signed)
Continues to follow with neurology. Call with any concerns. Doing much better. °

## 2020-04-10 LAB — TSH: TSH: 1.97 u[IU]/mL (ref 0.450–4.500)

## 2020-04-10 LAB — PSA: Prostate Specific Ag, Serum: 3.8 ng/mL (ref 0.0–4.0)

## 2020-04-10 LAB — LIPID PANEL W/O CHOL/HDL RATIO
Cholesterol, Total: 185 mg/dL (ref 100–199)
HDL: 54 mg/dL (ref >39)
LDL Chol Calc (NIH): 115 mg/dL — ABNORMAL HIGH (ref 0–99)
Triglycerides: 90 mg/dL (ref 0–149)
VLDL Cholesterol Cal: 16 mg/dL (ref 5–40)

## 2020-04-10 LAB — CBC WITH DIFFERENTIAL/PLATELET
Basophils Absolute: 0 10*3/uL (ref 0.0–0.2)
Basos: 1 %
EOS (ABSOLUTE): 0.2 10*3/uL (ref 0.0–0.4)
Eos: 3 %
Hematocrit: 45.3 % (ref 37.5–51.0)
Hemoglobin: 14.7 g/dL (ref 13.0–17.7)
Immature Grans (Abs): 0.1 10*3/uL (ref 0.0–0.1)
Immature Granulocytes: 1 %
Lymphocytes Absolute: 1.4 10*3/uL (ref 0.7–3.1)
Lymphs: 21 %
MCH: 28.5 pg (ref 26.6–33.0)
MCHC: 32.5 g/dL (ref 31.5–35.7)
MCV: 88 fL (ref 79–97)
Monocytes Absolute: 0.6 10*3/uL (ref 0.1–0.9)
Monocytes: 10 %
Neutrophils Absolute: 4.2 10*3/uL (ref 1.4–7.0)
Neutrophils: 64 %
Platelets: 267 10*3/uL (ref 150–450)
RBC: 5.16 x10E6/uL (ref 4.14–5.80)
RDW: 13.2 % (ref 11.6–15.4)
WBC: 6.5 10*3/uL (ref 3.4–10.8)

## 2020-04-10 LAB — COMPREHENSIVE METABOLIC PANEL
ALT: 6 IU/L (ref 0–44)
AST: 15 IU/L (ref 0–40)
Albumin/Globulin Ratio: 1.9 (ref 1.2–2.2)
Albumin: 4 g/dL (ref 3.8–4.8)
Alkaline Phosphatase: 103 IU/L (ref 44–121)
BUN/Creatinine Ratio: 13 (ref 10–24)
BUN: 11 mg/dL (ref 8–27)
Bilirubin Total: 0.3 mg/dL (ref 0.0–1.2)
CO2: 21 mmol/L (ref 20–29)
Calcium: 9.1 mg/dL (ref 8.6–10.2)
Chloride: 104 mmol/L (ref 96–106)
Creatinine, Ser: 0.86 mg/dL (ref 0.76–1.27)
GFR calc Af Amer: 104 mL/min/{1.73_m2} (ref >59)
GFR calc non Af Amer: 90 mL/min/{1.73_m2} (ref >59)
Globulin, Total: 2.1 g/dL (ref 1.5–4.5)
Glucose: 90 mg/dL (ref 65–99)
Potassium: 4 mmol/L (ref 3.5–5.2)
Sodium: 139 mmol/L (ref 134–144)
Total Protein: 6.1 g/dL (ref 6.0–8.5)

## 2020-06-22 DIAGNOSIS — M1990 Unspecified osteoarthritis, unspecified site: Secondary | ICD-10-CM | POA: Diagnosis not present

## 2020-06-22 DIAGNOSIS — Z6828 Body mass index (BMI) 28.0-28.9, adult: Secondary | ICD-10-CM | POA: Diagnosis not present

## 2020-06-22 DIAGNOSIS — Z79899 Other long term (current) drug therapy: Secondary | ICD-10-CM | POA: Diagnosis not present

## 2020-07-30 DIAGNOSIS — Z23 Encounter for immunization: Secondary | ICD-10-CM | POA: Diagnosis not present

## 2020-09-28 ENCOUNTER — Telehealth: Payer: Self-pay

## 2020-09-28 ENCOUNTER — Ambulatory Visit: Payer: Self-pay | Admitting: *Deleted

## 2020-09-28 NOTE — Telephone Encounter (Signed)
Copied from CRM #[id number]. Topic: Appointment Scheduling - Scheduling Inquiry for Clinic >> Sep 28, 2020 11:06 AM Benton, Victoria M wrote: Reason for CRM:patient wants to know if he can have virtual visit sooner than 06/20, for covid, diagnosed 06/12.  Please call back 

## 2020-09-28 NOTE — Telephone Encounter (Signed)
Lvm to schddle sooner apt with other Providers.

## 2020-09-28 NOTE — Telephone Encounter (Signed)
Please advise. Virtual scheduled tomorrow

## 2020-09-28 NOTE — Telephone Encounter (Signed)
Has an appt with you tomorrow !!!

## 2020-09-28 NOTE — Telephone Encounter (Signed)
Reason for Disposition  [1] COVID-19 diagnosed by positive lab test (e.g., PCR, rapid self-test kit) AND [2] mild symptoms (e.g., cough, fever, others) AND [6] no complications or SOB  Answer Assessment - Initial Assessment Questions 1. COVID-19 DIAGNOSIS: "Who made your COVID-19 diagnosis?" "Was it confirmed by a positive lab test or self-test?" If not diagnosed by a doctor (or NP/PA), ask "Are there lots of cases (community spread) where you live?" Note: See public health department website, if unsure.     At home test positive  2. COVID-19 EXPOSURE: "Was there any known exposure to COVID before the symptoms began?" CDC Definition of close contact: within 6 feet (2 meters) for a total of 15 minutes or more over a 24-hour period.      Possible , was on plane Thursday  3. ONSET: "When did the COVID-19 symptoms start?"      Last week 4. WORST SYMPTOM: "What is your worst symptom?" (e.g., cough, fever, shortness of breath, muscle aches)     Headache, cough, feeling tired 5. COUGH: "Do you have a cough?" If Yes, ask: "How bad is the cough?"       yes 6. FEVER: "Do you have a fever?" If Yes, ask: "What is your temperature, how was it measured, and when did it start?"     No but chills 7. RESPIRATORY STATUS: "Describe your breathing?" (e.g., shortness of breath, wheezing, unable to speak)      no 8. BETTER-SAME-WORSE: "Are you getting better, staying the same or getting worse compared to yesterday?"  If getting worse, ask, "In what way?"     na 9. HIGH RISK DISEASE: "Do you have any chronic medical problems?" (e.g., asthma, heart or lung disease, weak immune system, obesity, etc.)     na 10. VACCINE: "Have you had the COVID-19 vaccine?" If Yes, ask: "Which one, how many shots, when did you get it?"       Yes. Pfizer, 2 shots  11. BOOSTER: "Have you received your COVID-19 booster?" If Yes, ask: "Which one and when did you get it?"       Yes x 2 pfizer 12. PREGNANCY: "Is there any chance you are  pregnant?" "When was your last menstrual period?"       na 13. OTHER SYMPTOMS: "Do you have any other symptoms?"  (e.g., chills, fatigue, headache, loss of smell or taste, muscle pain, sore throat)       Headache, fatigue, chills, cough  14. O2 SATURATION MONITOR:  "Do you use an oxygen saturation monitor (pulse oximeter) at home?" If Yes, ask "What is your reading (oxygen level) today?" "What is your usual oxygen saturation reading?" (e.g., 95%)       na  Protocols used: Coronavirus (COVID-19) Diagnosed or Suspected-A-AH

## 2020-09-28 NOTE — Telephone Encounter (Signed)
Lvm to make sooner apt with other pcps

## 2020-09-28 NOTE — Telephone Encounter (Signed)
Dx with covid 09/27/20. Patient would like to know something to take until appt 10/05/20.  Patient called to review symptoms prior to appt. Patient reports symptoms started after a flight on Thursday. Now has headache , cough, fatigue, and chills last night. Took at home covid test and results positive. Denies chest pain , difficulty breathing or significant symptoms. Patient has been vaccinated and received boosters x 2 of Pfizer. Reviewed isolation guidelines. Patient would like PCP to know he is no longer taking prednisone and is now only taking methotrexate and folic acid. My Chart appt scheduled with Dr. Charlotta Newton Tuesday 09/29/20. Please advise if any other recommendations to treat symptoms. Care advise given. Patient verbalized understanding of care advise and to call back or go to [facility] or ED if symptoms worsen.

## 2020-09-28 NOTE — Telephone Encounter (Signed)
Copied from CRM [id number]. Topic: Appointment Scheduling - Scheduling Inquiry for Clinic >> Sep 28, 2020 11:06 AM Glean Salen wrote: Reason for PFY:[id number] wants to know if he can have virtual visit sooner than 06/20, for covid, diagnosed 06/12.  Please call back

## 2020-09-29 ENCOUNTER — Other Ambulatory Visit: Payer: Self-pay

## 2020-09-29 ENCOUNTER — Telehealth (INDEPENDENT_AMBULATORY_CARE_PROVIDER_SITE_OTHER): Payer: Medicare Other | Admitting: Internal Medicine

## 2020-09-29 ENCOUNTER — Encounter: Payer: Self-pay | Admitting: Internal Medicine

## 2020-09-29 VITALS — Temp 97.7°F

## 2020-09-29 DIAGNOSIS — U071 COVID-19: Secondary | ICD-10-CM | POA: Diagnosis not present

## 2020-09-29 MED ORDER — FEXOFENADINE HCL 180 MG PO TABS: 180.0000 mg | ORAL_TABLET | Freq: Every day | ORAL | 1 refills | Status: DC

## 2020-09-29 MED ORDER — AZITHROMYCIN 250 MG PO TABS: ORAL_TABLET | ORAL | 0 refills | Status: AC

## 2020-09-29 NOTE — Progress Notes (Signed)
Temp 97.7 F (36.5 C)    Subjective:    Patient ID: Shane Duncan, male    DOB: [birth year], 70 y.o.   MRN: [id number]  Chief Complaint  Patient presents with   Covid Positive    On Sunday morning. Wife tested pos. Today.   Sore Throat    HPI: Shane Duncan is a 70 y.o. male  I connected with  Deryl Giroux Roes on 09/29/20 by a video enabled telemedicine application and verified that I am speaking with the correct person using two identifiers.   I discussed the limitations of evaluation and management by telemedicine. The patient expressed understanding and agreed to proceed. "I discussed the limitations of evaluation and management by telemedicine and the availability of in person appointments. The patient expressed understanding and agreed to proceed"      This visit was completed via MyChart due to the restrictions of the COVID-19 pandemic. All issues as above were discussed and addressed. Physical exam was done as above through visual confirmation on MyChart. If it was felt that the patient should be evaluated in the office, they were directed there. The patient verbally consented to this visit. Location of the patient: home Location of the provider: work Those involved with this call:  Provider: Loura Pardon, MD CMA: Tristan Schroeder, CMA Front Desk/Registration: Harriet Pho  Time spent on call: 10 minutes with patient face to face via video conference. More than 50% of this time was spent in counseling and coordination of care. 10 minutes total spent in review of patient's record and preparation of their chart.   Sore Throat  Chronicity: 97.7 f, pt tested at home wife tested positive as wlel. was on a flight from [place]. Associated symptoms include headaches.  Headache  This is a new (sunday, sweats + fever 99 chills and sweating.) problem.   Chief Complaint  Patient presents with   Covid Positive    On Sunday morning. Wife tested pos. Today.   Sore Throat     Relevant past medical, surgical, family and social history reviewed and updated as indicated. Interim medical history since our last visit reviewed. Allergies and medications reviewed and updated.  Review of Systems  Neurological:  Positive for headaches.   Per HPI unless specifically indicated above     Objective:    Temp 97.7 F (36.5 C)   Wt Readings from Last 3 Encounters:  04/09/20 212 lb 9.6 oz (96.4 kg)  02/14/20 206 lb (93.4 kg)  10/07/19 208 lb 2 oz (94.4 kg)    Physical Exam  Unable to peform sec to virtual visit.   Results for orders placed or performed in visit on 04/09/20  Microscopic Examination   BLD  Result Value Ref Range   WBC, UA None seen 0 - 5 /hpf   RBC 0-2 0 - 2 /hpf   Epithelial Cells (non renal) 0-10 0 - 10 /hpf   Bacteria, UA None seen None seen/Few  Bayer DCA Hb A1c Waived  Result Value Ref Range   HB A1C (BAYER DCA - WAIVED) 5.2 <7.0 %  CBC with Differential/Platelet  Result Value Ref Range   WBC 6.5 3.4 - 10.8 x10E3/uL   RBC 5.16 4.14 - 5.80 x10E6/uL   Hemoglobin 14.7 13.0 - 17.7 g/dL   Hematocrit 81.4 48.1 - 51.0 %   MCV 88 79 - 97 fL   MCH 28.5 26.6 - 33.0 pg   MCHC 32.5 31.5 - 35.7 g/dL   RDW 85.6 31.4 -  15.4 %   Platelets 267 150 - 450 x10E3/uL   Neutrophils 64 Not Estab. %   Lymphs 21 Not Estab. %   Monocytes 10 Not Estab. %   Eos 3 Not Estab. %   Basos 1 Not Estab. %   Neutrophils Absolute 4.2 1.4 - 7.0 x10E3/uL   Lymphocytes Absolute 1.4 0.7 - 3.1 x10E3/uL   Monocytes Absolute 0.6 0.1 - 0.9 x10E3/uL   EOS (ABSOLUTE) 0.2 0.0 - 0.4 x10E3/uL   Basophils Absolute 0.0 0.0 - 0.2 x10E3/uL   Immature Granulocytes 1 Not Estab. %   Immature Grans (Abs) 0.1 0.0 - 0.1 x10E3/uL  Comprehensive metabolic panel  Result Value Ref Range   Glucose 90 65 - 99 mg/dL   BUN 11 8 - 27 mg/dL   Creatinine, Ser 1.61 0.76 - 1.27 mg/dL   GFR calc non Af Amer 90 >59 mL/min/1.73   GFR calc Af Amer 104 >59 mL/min/1.73   BUN/Creatinine Ratio 13  10 - 24   Sodium 139 134 - 144 mmol/L   Potassium 4.0 3.5 - 5.2 mmol/L   Chloride 104 96 - 106 mmol/L   CO2 21 20 - 29 mmol/L   Calcium 9.1 8.6 - 10.2 mg/dL   Total Protein 6.1 6.0 - 8.5 g/dL   Albumin 4.0 3.8 - 4.8 g/dL   Globulin, Total 2.1 1.5 - 4.5 g/dL   Albumin/Globulin Ratio 1.9 1.2 - 2.2   Bilirubin Total 0.3 0.0 - 1.2 mg/dL   Alkaline Phosphatase 103 44 - 121 IU/L   AST 15 0 - 40 IU/L   ALT 6 0 - 44 IU/L  Lipid Panel w/o Chol/HDL Ratio  Result Value Ref Range   Cholesterol, Total 185 100 - 199 mg/dL   Triglycerides 90 0 - 149 mg/dL   HDL 54 >09 mg/dL   VLDL Cholesterol Cal 16 5 - 40 mg/dL   LDL Chol Calc (NIH) 604 (H) 0 - 99 mg/dL  Microalbumin, Urine Waived  Result Value Ref Range   Microalb, Ur Waived 10 0 - 19 mg/L   Creatinine, Urine Waived 200 10 - 300 mg/dL   Microalb/Creat Ratio <30 <30 mg/g  PSA  Result Value Ref Range   Prostate Specific Ag, Serum 3.8 0.0 - 4.0 ng/mL  TSH  Result Value Ref Range   TSH 1.970 0.450 - 4.500 uIU/mL  Urinalysis, Routine w reflex microscopic  Result Value Ref Range   Specific Gravity, UA 1.025 1.005 - 1.030   pH, UA 5.0 5.0 - 7.5   Color, UA Yellow Yellow   Appearance Ur Clear Clear   Leukocytes,UA Negative Negative   Protein,UA Negative Negative/Trace   Glucose, UA Negative Negative   Ketones, UA Negative Negative   RBC, UA Trace (A) Negative   Bilirubin, UA Negative Negative   Urobilinogen, Ur 0.2 0.2 - 1.0 mg/dL   Nitrite, UA Negative Negative   Microscopic Examination See below:         Current Outpatient Medications:    Cholecalciferol (VITAMIN D3) 50 MCG (2000 UT) capsule, Take by mouth., Disp: , Rfl:    folic acid (FOLVITE) 1 MG tablet, Take 1 mg by mouth daily., Disp: , Rfl:    methotrexate (RHEUMATREX) 2.5 MG tablet, Take 20 mg by mouth once a week. , Disp: , Rfl:    zinc gluconate 50 MG tablet, Take by mouth., Disp: , Rfl:     Assessment & Plan:  COVID +ve: COVID : positive :  Increase fluid intake.  Headahce -  tyelnol every 4-6 hrs prn and alternate this with ibubrufen 800 mg q 8 hrly.Sinus pressure: use steam inhalation. O TC -  Allegra / claritin. 5 days quarantine.  Ok to rtw in 5 days if tests -ve follow   Problem List Items Addressed This Visit   None    Follow up plan: No follow-ups on file.

## 2020-10-05 ENCOUNTER — Telehealth: Payer: Medicare Other | Admitting: Family Medicine

## 2020-10-08 ENCOUNTER — Ambulatory Visit: Payer: Medicare Other | Admitting: Family Medicine

## 2020-10-15 DIAGNOSIS — M316 Other giant cell arteritis: Secondary | ICD-10-CM | POA: Diagnosis not present

## 2020-10-15 DIAGNOSIS — M1990 Unspecified osteoarthritis, unspecified site: Secondary | ICD-10-CM | POA: Diagnosis not present

## 2020-10-15 DIAGNOSIS — Z79899 Other long term (current) drug therapy: Secondary | ICD-10-CM | POA: Diagnosis not present

## 2020-11-23 DIAGNOSIS — M316 Other giant cell arteritis: Secondary | ICD-10-CM | POA: Diagnosis not present

## 2020-11-23 DIAGNOSIS — Z79899 Other long term (current) drug therapy: Secondary | ICD-10-CM | POA: Diagnosis not present

## 2020-11-23 DIAGNOSIS — Z6827 Body mass index (BMI) 27.0-27.9, adult: Secondary | ICD-10-CM | POA: Diagnosis not present

## 2020-11-23 DIAGNOSIS — M1990 Unspecified osteoarthritis, unspecified site: Secondary | ICD-10-CM | POA: Diagnosis not present

## 2020-11-30 DIAGNOSIS — H902 Conductive hearing loss, unspecified: Secondary | ICD-10-CM | POA: Diagnosis not present

## 2020-11-30 DIAGNOSIS — H6123 Impacted cerumen, bilateral: Secondary | ICD-10-CM | POA: Diagnosis not present

## 2021-02-04 DIAGNOSIS — Z23 Encounter for immunization: Secondary | ICD-10-CM | POA: Diagnosis not present

## 2021-02-15 ENCOUNTER — Ambulatory Visit (INDEPENDENT_AMBULATORY_CARE_PROVIDER_SITE_OTHER): Payer: Medicare Other

## 2021-02-15 VITALS — Ht 72.0 in | Wt 198.0 lb

## 2021-02-15 DIAGNOSIS — Z0000 Encounter for general adult medical examination without abnormal findings: Secondary | ICD-10-CM

## 2021-02-15 NOTE — Progress Notes (Signed)
I connected with [employer] today by telephone and verified that I am speaking with the correct person using two identifiers. Location patient: home Location provider: work Persons participating in the virtual visit: [employer], Glenna Durand LPN.   I discussed the limitations, risks, security and privacy concerns of performing an evaluation and management service by telephone and the availability of in person appointments. I also discussed with the patient that there may be a patient responsible charge related to this service. The patient expressed understanding and verbally consented to this telephonic visit.    Interactive audio and video telecommunications were attempted between this provider and patient, however failed, due to patient having technical difficulties OR patient did not have access to video capability.  We continued and completed visit with audio only.     Vital signs may be patient reported or missing.  Subjective:   Shane Duncan is a 70 y.o. male who presents for Medicare Annual/Subsequent preventive examination.  Review of Systems     Cardiac Risk Factors include: advanced age (>60men, >2 women);dyslipidemia;hypertension;male gender     Objective:    Today's Vitals   02/15/21 0941  Weight: 198 lb (89.8 kg)  Height: 6' (1.829 m)  PainSc: 5    Body mass index is 26.85 kg/m.  Advanced Directives 02/15/2021 02/14/2020 01/21/2019 06/11/2018 02/02/2018  Does Patient Have a Medical Advance Directive? Yes Yes Yes Yes Yes  Type of Paramedic of [place];Living will [place];Living will Living will;Healthcare Power of Attorney Living will;Healthcare Power of [place];Living will  Does patient want to make changes to medical advance directive? - - - No - Patient declined -  Copy of [place] in Chart? No - copy requested No - copy requested No - copy requested No -  copy requested No - copy requested    Current Medications (verified) Outpatient Encounter Medications as of 02/15/2021  Medication Sig   folic acid (FOLVITE) 1 MG tablet Take 1 mg by mouth daily.   methotrexate (RHEUMATREX) 2.5 MG tablet Take 20 mg by mouth once a week.    Cholecalciferol (VITAMIN D3) 50 MCG (2000 UT) capsule Take by mouth. (Patient not taking: Reported on 02/15/2021)   fexofenadine (ALLEGRA ALLERGY) 180 MG tablet Take 1 tablet (180 mg total) by mouth daily for 14 days.   zinc gluconate 50 MG tablet Take by mouth. (Patient not taking: Reported on 02/15/2021)   No facility-administered encounter medications on file as of 02/15/2021.    Allergies (verified) Azathioprine, Lactose, and Penicillins   History: Past Medical History:  Diagnosis Date   Bronchitis    Chronic sinusitis    Esophagitis    Hemorrhoids    Hepatitis A    Hyperlipidemia    Hypertension    IFG (impaired fasting glucose)    Temporal arteritis ([place]) 01/2018   Past Surgical History:  Procedure Laterality Date   ACHILLES TENDON SURGERY Right 1997   ANUS SURGERY  1990's   anal fistula repair   ARTERY BIOPSY Left 02/05/2018   Procedure: BIOPSY TEMPORAL ARTERY;  Surgeon: Algernon Huxley, MD;  Location: [facility] ORS;  Service: Vascular;  Laterality: Left;   TONSILLECTOMY  1961   Family History  Problem Relation Age of Onset   Cancer Father        Colon   Hypertension Father    Hyperlipidemia Father    Ulcerative colitis Son    Social History   Socioeconomic History  Marital status: Married    Spouse name: Not on file   Number of children: Not on file   Years of education: Not on file   Highest education level: Associate degree: academic program  Occupational History   Occupation: retired   Tobacco Use   Smoking status: Never   Smokeless tobacco: Never  Vaping Use   Vaping Use: Never used  Substance and Sexual Activity   Alcohol use: Yes    Comment: occassional    Drug use: Yes     Types: Marijuana    Comment: daily    Sexual activity: Yes  Other Topics Concern   Not on file  Social History Narrative   Not on file   Social Determinants of Health   Financial Resource Strain: Low Risk    Difficulty of Paying Living Expenses: Not hard at all  Food Insecurity: No Food Insecurity   Worried About Charity fundraiser in the Last Year: Never true   [place] in the Last Year: Never true  Transportation Needs: No Transportation Needs   Lack of Transportation (Medical): No   Lack of Transportation (Non-Medical): No  Physical Activity: Sufficiently Active   Days of Exercise per Week: 4 days   Minutes of Exercise per Session: 60 min  Stress: No Stress Concern Present   Feeling of Stress : Not at all  Social Connections: Not on file    Tobacco Counseling Counseling given: Not Answered   Clinical Intake:  Pre-visit preparation completed: Yes  Pain : 0-10 Pain Score: 5  Pain Type: Chronic pain Pain Location: Generalized Pain Descriptors / Indicators: Aching Pain Onset: More than a month ago Pain Frequency: Constant     Nutritional Status: BMI 25 -29 Overweight Nutritional Risks: None Diabetes: No  How often do you need to have someone help you when you read instructions, pamphlets, or other written materials from your doctor or pharmacy?: 1 - Never What is the last grade level you completed in school?: 70yrs college  Diabetic? no  Interpreter Needed?: No  Information entered by :: NAllen LPN   Activities of Daily Living In your present state of health, do you have any difficulty performing the following activities: 02/15/2021 04/09/2020  Hearing? N N  Vision? N N  Difficulty concentrating or making decisions? N N  Walking or climbing stairs? N N  Dressing or bathing? N N  Doing errands, shopping? N N  Preparing Food and eating ? N -  Using the Toilet? N -  In the past six months, have you accidently leaked urine? Y -  Do you have  problems with loss of bowel control? N -  Managing your Medications? N -  Managing your Finances? N -  Housekeeping or managing your Housekeeping? N -  Some recent data might be hidden    Patient Care Team: Valerie Roys, DO as PCP - General (Family Medicine)  Indicate any recent Medical Services you may have received from other than [facility] providers in the past year (date may be approximate).     Assessment:   This is a routine wellness examination for [place].  Hearing/Vision screen No results found.  Dietary issues and exercise activities discussed: Current Exercise Habits: Home exercise routine, Type of exercise: treadmill;strength training/weights (yard work,), Time (Minutes): 60, Frequency (Times/Week): 4, Weekly Exercise (Minutes/Week): 240   Goals Addressed             This Visit's Progress    Patient Stated  02/15/2021, wants to get stronger       Depression Screen PHQ 2/9 Scores 02/15/2021 09/29/2020 04/09/2020 02/14/2020 03/28/2019 01/21/2019 01/15/2019  PHQ - 2 Score 0 0 0 0 0 0 0    Fall Risk Fall Risk  02/15/2021 09/29/2020 04/09/2020 02/14/2020 03/28/2019  Falls in the past year? 0 0 0 0 0  Number falls in past yr: - 0 0 - 0  Injury with Fall? - 0 0 - 0  Risk for fall due to : No Fall Risks No Fall Risks No Fall Risks Medication side effect -  Follow up Falls evaluation completed;Education provided;Falls prevention discussed Falls evaluation completed Falls evaluation completed Falls evaluation completed;Education provided;Falls prevention discussed -    FALL RISK PREVENTION PERTAINING TO THE HOME:  Any stairs in or around the home? Yes  If so, are there any without handrails? No  Home free of loose throw rugs in walkways, pet beds, electrical cords, etc? Yes  Adequate lighting in your home to reduce risk of falls? Yes   ASSISTIVE DEVICES UTILIZED TO PREVENT FALLS:  Life alert? No  Use of a cane, walker or w/c? No  Grab bars in the bathroom?  No  Shower chair or bench in shower? Yes  Elevated toilet seat or a handicapped toilet? Yes   TIMED UP AND GO:  Was the test performed? No .      Cognitive Function:     6CIT Screen 02/15/2021 02/14/2020  What Year? 0 points 0 points  What month? 0 points 0 points  What time? 0 points 0 points  Count back from 20 0 points 0 points  Months in reverse 0 points 0 points  Repeat phrase 2 points 0 points  Total Score 2 0    Immunizations Immunization History  Administered Date(s) Administered   Fluad Quad(high Dose 65+) 01/16/2019   PFIZER(Purple Top)SARS-COV-2 Vaccination 05/28/2019, 06/18/2019, 01/20/2020, 08/04/2020   Pneumococcal Conjugate-13 01/16/2019   Pneumococcal Polysaccharide-23 04/09/2020   Tdap 12/30/2010    TDAP status: Due, Education has been provided regarding the importance of this vaccine. Advised may receive this vaccine at local pharmacy or Health Dept. Aware to provide a copy of the vaccination record if obtained from local pharmacy or Health Dept. Verbalized acceptance and understanding.  Flu Vaccine status: Due, Education has been provided regarding the importance of this vaccine. Advised may receive this vaccine at local pharmacy or Health Dept. Aware to provide a copy of the vaccination record if obtained from local pharmacy or Health Dept. Verbalized acceptance and understanding.  Pneumococcal vaccine status: Up to date  Covid-19 vaccine status: Completed vaccines  Qualifies for Shingles Vaccine? Yes   Zostavax completed No   Shingrix Completed?: No.    Education has been provided regarding the importance of this vaccine. Patient has been advised to call insurance company to determine out of pocket expense if they have not yet received this vaccine. Advised may also receive vaccine at local pharmacy or Health Dept. Verbalized acceptance and understanding.  Screening Tests Health Maintenance  Topic Date Due   Zoster Vaccines- Shingrix (1 of 2)  Never done   COVID-19 Vaccine (5 - Booster for Pfizer series) 09/29/2020   INFLUENZA VACCINE  11/16/2020   TETANUS/TDAP  12/29/2020   URINE MICROALBUMIN  04/09/2021   COLONOSCOPY (Pts 45-56yrs Insurance coverage will need to be confirmed)  01/10/2024   Pneumonia Vaccine 18+ Years old  Completed   Hepatitis C Screening  Completed   HPV VACCINES  Aged Out  Health Maintenance  Health Maintenance Due  Topic Date Due   Zoster Vaccines- Shingrix (1 of 2) Never done   COVID-19 Vaccine (5 - Booster for Pfizer series) 09/29/2020   INFLUENZA VACCINE  11/16/2020   TETANUS/TDAP  12/29/2020   URINE MICROALBUMIN  04/09/2021    Colorectal cancer screening: Type of screening: Colonoscopy. Completed 01/09/2014. Repeat every 10 years  Lung Cancer Screening: (Low Dose CT Chest recommended if Age 73-80 years, 30 pack-year currently smoking OR have quit w/in 15years.) does not qualify.   Lung Cancer Screening Referral: no  Additional Screening:  Hepatitis C Screening: does qualify; Completed 03/28/2019  Vision Screening: Recommended annual ophthalmology exams for early detection of glaucoma and other disorders of the eye. Is the patient up to date with their annual eye exam?  No  Who is the provider or what is the name of the office in which the patient attends annual eye exams? [facility] If pt is not established with a provider, would they like to be referred to a provider to establish care? No .   Dental Screening: Recommended annual dental exams for proper oral hygiene  Community Resource Referral / Chronic Care Management: CRR required this visit?  No   CCM required this visit?  No      Plan:     I have personally reviewed and noted the following in the patient's chart:   Medical and social history Use of alcohol, tobacco or illicit drugs  Current medications and supplements including opioid prescriptions. Patient is not currently taking opioid prescriptions. Functional  ability and status Nutritional status Physical activity Advanced directives List of other physicians Hospitalizations, surgeries, and ER visits in previous 12 months Vitals Screenings to include cognitive, depression, and falls Referrals and appointments  In addition, I have reviewed and discussed with patient certain preventive protocols, quality metrics, and best practice recommendations. A written personalized care plan for preventive services as well as general preventive health recommendations were provided to patient.     Barb Merino, LPN   [id number]   Nurse Notes:

## 2021-02-15 NOTE — Patient Instructions (Signed)
[name] , Thank you for taking time to come for your Medicare Wellness Visit. I appreciate your ongoing commitment to your health goals. Please review the following plan we discussed and let me know if I can assist you in the future.   Screening recommendations/referrals: Colonoscopy: completed 01/09/2014 Recommended yearly ophthalmology/optometry visit for glaucoma screening and checkup Recommended yearly dental visit for hygiene and checkup  Vaccinations: Influenza vaccine: due Pneumococcal vaccine: completed 04/09/2020 Tdap vaccine: due Shingles vaccine: discussed   Covid-19:  02/04/2021, 08/04/2020, 01/20/2020, 06/18/2019, 05/28/2019  Advanced directives: Please bring a copy of your POA (Power of Attorney) and/or Living Will to your next appointment.   Conditions/risks identified: none  Next appointment: Follow up in one year for your annual wellness visit.   Preventive Care 70 Years and Older, Male Preventive care refers to lifestyle choices and visits with your health care provider that can promote health and wellness. What does preventive care include? A yearly physical exam. This is also called an annual well check. Dental exams once or twice a year. Routine eye exams. Ask your health care provider how often you should have your eyes checked. Personal lifestyle choices, including: Daily care of your teeth and gums. Regular physical activity. Eating a healthy diet. Avoiding tobacco and drug use. Limiting alcohol use. Practicing safe sex. Taking low doses of aspirin every day. Taking vitamin and mineral supplements as recommended by your health care provider. What happens during an annual well check? The services and screenings done by your health care provider during your annual well check will depend on your age, overall health, lifestyle risk factors, and family history of disease. Counseling  Your health care provider may ask you questions about your: Alcohol  use. Tobacco use. Drug use. Emotional well-being. Home and relationship well-being. Sexual activity. Eating habits. History of falls. Memory and ability to understand (cognition). Work and work Astronomer. Screening  You may have the following tests or measurements: Height, weight, and BMI. Blood pressure. Lipid and cholesterol levels. These may be checked every 5 years, or more frequently if you are over 44 years old. Skin check. Lung cancer screening. You may have this screening every year starting at age 15 if you have a 30-pack-year history of smoking and currently smoke or have quit within the past 15 years. Fecal occult blood test (FOBT) of the stool. You may have this test every year starting at age 53. Flexible sigmoidoscopy or colonoscopy. You may have a sigmoidoscopy every 5 years or a colonoscopy every 10 years starting at age 54. Prostate cancer screening. Recommendations will vary depending on your family history and other risks. Hepatitis C blood test. Hepatitis B blood test. Sexually transmitted disease (STD) testing. Diabetes screening. This is done by checking your blood sugar (glucose) after you have not eaten for a while (fasting). You may have this done every 1-3 years. Abdominal aortic aneurysm (AAA) screening. You may need this if you are a current or former smoker. Osteoporosis. You may be screened starting at age 37 if you are at high risk. Talk with your health care provider about your test results, treatment options, and if necessary, the need for more tests. Vaccines  Your health care provider may recommend certain vaccines, such as: Influenza vaccine. This is recommended every year. Tetanus, diphtheria, and acellular pertussis (Tdap, Td) vaccine. You may need a Td booster every 10 years. Zoster vaccine. You may need this after age 57. Pneumococcal 13-valent conjugate (PCV13) vaccine. One dose is recommended after age  65. Pneumococcal polysaccharide  (PPSV23) vaccine. One dose is recommended after age 34. Talk to your health care provider about which screenings and vaccines you need and how often you need them. This information is not intended to replace advice given to you by your health care provider. Make sure you discuss any questions you have with your health care provider. Document Released: 05/01/2015 Document Revised: 12/23/2015 Document Reviewed: 02/03/2015 Elsevier Interactive Patient Education  2017 [place] Prevention in the Home Falls can cause injuries. They can happen to people of all ages. There are many things you can do to make your home safe and to help prevent falls. What can I do on the outside of my home? Regularly fix the edges of walkways and driveways and fix any cracks. Remove anything that might make you trip as you walk through a door, such as a raised step or threshold. Trim any bushes or trees on the path to your home. Use bright outdoor lighting. Clear any walking paths of anything that might make someone trip, such as rocks or tools. Regularly check to see if handrails are loose or broken. Make sure that both sides of any steps have handrails. Any raised decks and porches should have guardrails on the edges. Have any leaves, snow, or ice cleared regularly. Use sand or salt on walking paths during winter. Clean up any spills in your garage right away. This includes oil or grease spills. What can I do in the bathroom? Use night lights. Install grab bars by the toilet and in the tub and shower. Do not use towel bars as grab bars. Use non-skid mats or decals in the tub or shower. If you need to sit down in the shower, use a plastic, non-slip stool. Keep the floor dry. Clean up any water that spills on the floor as soon as it happens. Remove soap buildup in the tub or shower regularly. Attach bath mats securely with double-sided non-slip rug tape. Do not have throw rugs and other things on the  floor that can make you trip. What can I do in the bedroom? Use night lights. Make sure that you have a light by your bed that is easy to reach. Do not use any sheets or blankets that are too big for your bed. They should not hang down onto the floor. Have a firm chair that has side arms. You can use this for support while you get dressed. Do not have throw rugs and other things on the floor that can make you trip. What can I do in the kitchen? Clean up any spills right away. Avoid walking on wet floors. Keep items that you use a lot in easy-to-reach places. If you need to reach something above you, use a strong step stool that has a grab bar. Keep electrical cords out of the way. Do not use floor polish or wax that makes floors slippery. If you must use wax, use non-skid floor wax. Do not have throw rugs and other things on the floor that can make you trip. What can I do with my stairs? Do not leave any items on the stairs. Make sure that there are handrails on both sides of the stairs and use them. Fix handrails that are broken or loose. Make sure that handrails are as long as the stairways. Check any carpeting to make sure that it is firmly attached to the stairs. Fix any carpet that is loose or worn. Avoid having throw rugs at  the top or bottom of the stairs. If you do have throw rugs, attach them to the floor with carpet tape. Make sure that you have a light switch at the top of the stairs and the bottom of the stairs. If you do not have them, ask someone to add them for you. What else can I do to help prevent falls? Wear shoes that: Do not have high heels. Have rubber bottoms. Are comfortable and fit you well. Are closed at the toe. Do not wear sandals. If you use a stepladder: Make sure that it is fully opened. Do not climb a closed stepladder. Make sure that both sides of the stepladder are locked into place. Ask someone to hold it for you, if possible. Clearly mark and make  sure that you can see: Any grab bars or handrails. First and last steps. Where the edge of each step is. Use tools that help you move around (mobility aids) if they are needed. These include: Canes. Walkers. Scooters. Crutches. Turn on the lights when you go into a dark area. Replace any light bulbs as soon as they burn out. Set up your furniture so you have a clear path. Avoid moving your furniture around. If any of your floors are uneven, fix them. If there are any pets around you, be aware of where they are. Review your medicines with your doctor. Some medicines can make you feel dizzy. This can increase your chance of falling. Ask your doctor what other things that you can do to help prevent falls. This information is not intended to replace advice given to you by your health care provider. Make sure you discuss any questions you have with your health care provider. Document Released: 01/29/2009 Document Revised: 09/10/2015 Document Reviewed: 05/09/2014 Elsevier Interactive Patient Education  2017 [employer].

## 2021-04-15 ENCOUNTER — Other Ambulatory Visit: Payer: Self-pay | Admitting: Internal Medicine

## 2021-04-15 NOTE — Telephone Encounter (Signed)
Pt states he gets this medication from another provider. He will be calling them to get it.

## 2021-04-15 NOTE — Telephone Encounter (Signed)
Requested medications are due for refill today.  unsure  Requested medications are on the active medications list.  yes  Last refill. 12/27/2018  Future visit scheduled.   yes  Notes to clinic.  Rx written by historical provider.    Requested Prescriptions  Pending Prescriptions Disp Refills   folic acid (FOLVITE) 1 MG tablet [Pharmacy Med Name: FOLIC ACID 1 MG TABLET] 90 tablet 2    Sig: TAKE 1 TABLET BY MOUTH EVERY DAY     Endocrinology:  Vitamins Passed - 04/15/2021  1:33 AM      Passed - Valid encounter within last 12 months    Recent Outpatient Visits           6 months ago COVID-19   [facility] [place], [place], MD   1 year ago Benign prostatic hyperplasia with nocturia   [facility] [place], [place], DO   1 year ago Essential hypertension   [facility] [place], Megan P, DO   1 year ago Seronegative arthritis   [facility] [place], [place], DO   2 years ago Essential hypertension   [facility] [place], Megan P, DO       Future Appointments             In 10 months [employer], [facility]

## 2021-04-21 ENCOUNTER — Ambulatory Visit (INDEPENDENT_AMBULATORY_CARE_PROVIDER_SITE_OTHER): Payer: Medicare Other | Admitting: Nurse Practitioner

## 2021-04-21 ENCOUNTER — Other Ambulatory Visit: Payer: Self-pay | Admitting: Nurse Practitioner

## 2021-04-21 ENCOUNTER — Other Ambulatory Visit: Payer: Self-pay

## 2021-04-21 ENCOUNTER — Encounter: Payer: Self-pay | Admitting: Nurse Practitioner

## 2021-04-21 VITALS — BP 181/69 | HR 72 | Temp 98.1°F | Wt 200.6 lb

## 2021-04-21 DIAGNOSIS — R1110 Vomiting, unspecified: Secondary | ICD-10-CM

## 2021-04-21 DIAGNOSIS — R1011 Right upper quadrant pain: Secondary | ICD-10-CM | POA: Diagnosis not present

## 2021-04-21 NOTE — Assessment & Plan Note (Signed)
Pain has had vomiting, difficulty tolerating food, abdominal pain since Saturday.  Had significant RUQ tenderness on exam. Will obtain CMP, CBC and STAT RUQ [place].  Will make recommendations based on results.

## 2021-04-21 NOTE — Progress Notes (Signed)
BP (!) 181/69    Pulse 72    Temp 98.1 F (36.7 C) (Oral)    Wt 200 lb 9.6 oz (91 kg)    SpO2 98%    BMI 27.21 kg/m    Subjective:    Patient ID: Shane Duncan, male    DOB: [birth year], 71 y.o.   MRN: [id number]  HPI: Shane Duncan is a 71 y.o. male  Chief Complaint  Patient presents with   Abdominal Pain    Pt states he has been having pain in his R side for the last few days. States he had some vomiting on Saturday. States both of his sides hurt when he is laying down and has been having issues with gas at bedtime as well.    Pt states he has been having pain in his R side for the last few days. States he had some vomiting on Saturday. States both of his sides hurt when he is laying down and has been having issues with gas at bedtime as well.   Patient states the pain is a 4-5.  Patient states the worst pain he is having is when he is eats.  He ate poorly leading up to the throwing up on Sunday morning.  He wasn't able to lay on either side the last two nights. When he eats something now it makes everything hurt.  He ate a banana and peanut butter and it did not make things feel right.  He drank some water to prevent dehydration and that did not sit well with him either.   Relevant past medical, surgical, family and social history reviewed and updated as indicated. Interim medical history since our last visit reviewed. Allergies and medications reviewed and updated.  Review of Systems  Gastrointestinal:  Positive for abdominal pain and vomiting.   Per HPI unless specifically indicated above     Objective:    BP (!) 181/69    Pulse 72    Temp 98.1 F (36.7 C) (Oral)    Wt 200 lb 9.6 oz (91 kg)    SpO2 98%    BMI 27.21 kg/m   Wt Readings from Last 3 Encounters:  04/21/21 200 lb 9.6 oz (91 kg)  02/15/21 198 lb (89.8 kg)  04/09/20 212 lb 9.6 oz (96.4 kg)    Physical Exam Vitals and nursing note reviewed.  Constitutional:      General: He is not in acute distress.     Appearance: Normal appearance. He is not ill-appearing, toxic-appearing or diaphoretic.  HENT:     Head: Normocephalic.     Right Ear: External ear normal.     Left Ear: External ear normal.     Nose: Nose normal. No congestion or rhinorrhea.     Mouth/Throat:     Mouth: Mucous membranes are moist.  Eyes:     General:        Right eye: No discharge.        Left eye: No discharge.     Extraocular Movements: Extraocular movements intact.     Conjunctiva/sclera: Conjunctivae normal.     Pupils: Pupils are equal, round, and reactive to light.  Cardiovascular:     Rate and Rhythm: Normal rate and regular rhythm.     Heart sounds: No murmur heard. Pulmonary:     Effort: Pulmonary effort is normal. No respiratory distress.     Breath sounds: Normal breath sounds. No wheezing, rhonchi or rales.  Abdominal:  General: Abdomen is flat. Bowel sounds are normal.     Tenderness: There is abdominal tenderness in the right upper quadrant. There is guarding. There is no right CVA tenderness or left CVA tenderness. Negative signs include Murphy's sign and McBurney's sign.  Musculoskeletal:     Cervical back: Normal range of motion and neck supple.  Skin:    General: Skin is warm and dry.     Capillary Refill: Capillary refill takes less than 2 seconds.  Neurological:     General: No focal deficit present.     Mental Status: He is alert and oriented to person, place, and time.  Psychiatric:        Mood and Affect: Mood normal.        Behavior: Behavior normal.        Thought Content: Thought content normal.        Judgment: Judgment normal.    Results for orders placed or performed in visit on 04/09/20  Microscopic Examination   BLD  Result Value Ref Range   WBC, UA None seen 0 - 5 /hpf   RBC 0-2 0 - 2 /hpf   Epithelial Cells (non renal) 0-10 0 - 10 /hpf   Bacteria, UA None seen None seen/Few  Bayer DCA Hb A1c Waived  Result Value Ref Range   HB A1C (BAYER DCA - WAIVED) 5.2 <7.0 %   CBC with Differential/Platelet  Result Value Ref Range   WBC 6.5 3.4 - 10.8 x10E3/uL   RBC 5.16 4.14 - 5.80 x10E6/uL   Hemoglobin 14.7 13.0 - 17.7 g/dL   Hematocrit 45.3 37.5 - 51.0 %   MCV 88 79 - 97 fL   MCH 28.5 26.6 - 33.0 pg   MCHC 32.5 31.5 - 35.7 g/dL   RDW 13.2 11.6 - 15.4 %   Platelets 267 150 - 450 x10E3/uL   Neutrophils 64 Not Estab. %   Lymphs 21 Not Estab. %   Monocytes 10 Not Estab. %   Eos 3 Not Estab. %   Basos 1 Not Estab. %   Neutrophils Absolute 4.2 1.4 - 7.0 x10E3/uL   Lymphocytes Absolute 1.4 0.7 - 3.1 x10E3/uL   Monocytes Absolute 0.6 0.1 - 0.9 x10E3/uL   EOS (ABSOLUTE) 0.2 0.0 - 0.4 x10E3/uL   Basophils Absolute 0.0 0.0 - 0.2 x10E3/uL   Immature Granulocytes 1 Not Estab. %   Immature Grans (Abs) 0.1 0.0 - 0.1 x10E3/uL  Comprehensive metabolic panel  Result Value Ref Range   Glucose 90 65 - 99 mg/dL   BUN 11 8 - 27 mg/dL   Creatinine, Ser 0.86 0.76 - 1.27 mg/dL   GFR calc non Af Amer 90 >59 mL/min/1.73   GFR calc Af Amer 104 >59 mL/min/1.73   BUN/Creatinine Ratio 13 10 - 24   Sodium 139 134 - 144 mmol/L   Potassium 4.0 3.5 - 5.2 mmol/L   Chloride 104 96 - 106 mmol/L   CO2 21 20 - 29 mmol/L   Calcium 9.1 8.6 - 10.2 mg/dL   Total Protein 6.1 6.0 - 8.5 g/dL   Albumin 4.0 3.8 - 4.8 g/dL   Globulin, Total 2.1 1.5 - 4.5 g/dL   Albumin/Globulin Ratio 1.9 1.2 - 2.2   Bilirubin Total 0.3 0.0 - 1.2 mg/dL   Alkaline Phosphatase 103 44 - 121 IU/L   AST 15 0 - 40 IU/L   ALT 6 0 - 44 IU/L  Lipid Panel w/o Chol/HDL Ratio  Result Value Ref Range  Cholesterol, Total 185 100 - 199 mg/dL   Triglycerides 90 0 - 149 mg/dL   HDL 54 >39 mg/dL   VLDL Cholesterol Cal 16 5 - 40 mg/dL   LDL Chol Calc (NIH) 115 (H) 0 - 99 mg/dL  Microalbumin, Urine Waived  Result Value Ref Range   Microalb, Ur Waived 10 0 - 19 mg/L   Creatinine, Urine Waived 200 10 - 300 mg/dL   Microalb/Creat Ratio <30 <30 mg/g  PSA  Result Value Ref Range   Prostate Specific Ag, Serum 3.8 0.0  - 4.0 ng/mL  TSH  Result Value Ref Range   TSH 1.970 0.450 - 4.500 uIU/mL  Urinalysis, Routine w reflex microscopic  Result Value Ref Range   Specific Gravity, UA 1.025 1.005 - 1.030   pH, UA 5.0 5.0 - 7.5   Color, UA Yellow Yellow   Appearance Ur Clear Clear   Leukocytes,UA Negative Negative   Protein,UA Negative Negative/Trace   Glucose, UA Negative Negative   Ketones, UA Negative Negative   RBC, UA Trace (A) Negative   Bilirubin, UA Negative Negative   Urobilinogen, Ur 0.2 0.2 - 1.0 mg/dL   Nitrite, UA Negative Negative   Microscopic Examination See below:       Assessment & Plan:   Problem List Items Addressed This Visit       Other   Right upper quadrant abdominal pain - Primary    Pain has had vomiting, difficulty tolerating food, abdominal pain since Saturday.  Had significant RUQ tenderness on exam. Will obtain CMP, CBC and STAT RUQ [place].  Will make recommendations based on results.       Relevant Orders   Comp Met (CMET)   CBC w/Diff   US Abdomen Limited RUQ (LIVER/GB)   Other Visit Diagnoses     Vomiting, unspecified vomiting type, unspecified whether nausea present            Follow up plan: Return if symptoms worsen or fail to improve.

## 2021-04-21 NOTE — Patient Instructions (Signed)
[address] @ 9:45 NPO after midnight

## 2021-04-22 ENCOUNTER — Ambulatory Visit
Admission: RE | Admit: 2021-04-22 | Discharge: 2021-04-22 | Disposition: A | Discharge status: Home / Self Care | Payer: Medicare Other | Source: Ambulatory Visit | Attending: Nurse Practitioner | Admitting: Nurse Practitioner

## 2021-04-22 ENCOUNTER — Other Ambulatory Visit: Payer: Self-pay

## 2021-04-22 DIAGNOSIS — R1011 Right upper quadrant pain: Secondary | ICD-10-CM | POA: Insufficient documentation

## 2021-04-22 DIAGNOSIS — K824 Cholesterolosis of gallbladder: Secondary | ICD-10-CM | POA: Diagnosis not present

## 2021-04-22 LAB — CBC WITH DIFFERENTIAL/PLATELET
Basophils Absolute: 0 10*3/uL (ref 0.0–0.2)
Basos: 1 %
EOS (ABSOLUTE): 0.1 10*3/uL (ref 0.0–0.4)
Eos: 3 %
Hematocrit: 47.1 % (ref 37.5–51.0)
Hemoglobin: 15.5 g/dL (ref 13.0–17.7)
Immature Grans (Abs): 0 10*3/uL (ref 0.0–0.1)
Immature Granulocytes: 0 %
Lymphocytes Absolute: 1 10*3/uL (ref 0.7–3.1)
Lymphs: 24 %
MCH: 27.7 pg (ref 26.6–33.0)
MCHC: 32.9 g/dL (ref 31.5–35.7)
MCV: 84 fL (ref 79–97)
Monocytes Absolute: 0.6 10*3/uL (ref 0.1–0.9)
Monocytes: 13 %
Neutrophils Absolute: 2.6 10*3/uL (ref 1.4–7.0)
Neutrophils: 59 %
Platelets: 259 10*3/uL (ref 150–450)
RBC: 5.59 x10E6/uL (ref 4.14–5.80)
RDW: 13.6 % (ref 11.6–15.4)
WBC: 4.3 10*3/uL (ref 3.4–10.8)

## 2021-04-22 LAB — COMPREHENSIVE METABOLIC PANEL
ALT: 12 IU/L (ref 0–44)
AST: 19 IU/L (ref 0–40)
Albumin/Globulin Ratio: 1.8 (ref 1.2–2.2)
Albumin: 4.4 g/dL (ref 3.8–4.8)
Alkaline Phosphatase: 117 IU/L (ref 44–121)
BUN/Creatinine Ratio: 13 (ref 10–24)
BUN: 11 mg/dL (ref 8–27)
Bilirubin Total: 0.4 mg/dL (ref 0.0–1.2)
CO2: 20 mmol/L (ref 20–29)
Calcium: 9.3 mg/dL (ref 8.6–10.2)
Chloride: 102 mmol/L (ref 96–106)
Creatinine, Ser: 0.84 mg/dL (ref 0.76–1.27)
Globulin, Total: 2.5 g/dL (ref 1.5–4.5)
Glucose: 97 mg/dL (ref 70–99)
Potassium: 3.9 mmol/L (ref 3.5–5.2)
Sodium: 138 mmol/L (ref 134–144)
Total Protein: 6.9 g/dL (ref 6.0–8.5)
eGFR: 95 mL/min/{1.73_m2} (ref >59)

## 2021-04-22 NOTE — Progress Notes (Signed)
Hi Shane Duncan.  Your lab work from yesterday is reassuring. I will let you know once I receive the results from your ultrasound.

## 2021-04-22 NOTE — Progress Notes (Signed)
Results discussed with patient over the phone. He is feeling slightly better today. Discussed advancing diet as tolerated. Stay well hydrated. Reach out to office if symptoms do not improve.

## 2021-04-23 DIAGNOSIS — M1990 Unspecified osteoarthritis, unspecified site: Secondary | ICD-10-CM | POA: Diagnosis not present

## 2021-04-23 DIAGNOSIS — Z79899 Other long term (current) drug therapy: Secondary | ICD-10-CM | POA: Diagnosis not present

## 2021-04-23 DIAGNOSIS — Z6826 Body mass index (BMI) 26.0-26.9, adult: Secondary | ICD-10-CM | POA: Diagnosis not present

## 2021-09-07 ENCOUNTER — Ambulatory Visit: Payer: Self-pay

## 2021-09-07 NOTE — Telephone Encounter (Signed)
  Chief Complaint: HTN Symptoms: High BP readings Frequency: a few months Pertinent Negatives: Patient denies Any SOB, chest pain HA, neuro issues Disposition: [] ED /[x] Urgent Care (no appt availability in office) / [] Appointment(In office/virtual)/ []  [facility]/ [] Home Care/ [] Refused Recommended Disposition /[] [place] [facility]/ []  Follow-up with PCP Additional Notes: PT is currently in [facility] and went to [employer], where he took his BP on a store machine. At the store about 1 hour ago that reading was 201/91. About 1 month ago his BP at [employer] was 160/90, and 1 month before that it was 190/90. Pt will go to UC to verify readings and for treatment. Made follow up appt for later this week. Reason for Disposition  [id number] Systolic BP  >= [id number] OR Diastolic >= [id number] AND [id number] having NO cardiac or neurologic symptoms  Answer Assessment - Initial Assessment Questions 1. BLOOD PRESSURE: "What is the blood pressure?" "Did you take at least two measurements 5 minutes apart?"     201/91 - an hour ago. 2. ONSET: "When did you take your blood pressure?"     At [employer] 1 hour ago 3. HOW: "How did you obtain the blood pressure?" (e.g., visiting nurse, automatic home BP monitor)     Automatic [employer] monitor 4. HISTORY: "Do you have a history of high blood pressure?"     no 5. MEDICATIONS: "Are you taking any medications for blood pressure?" "Have you missed any doses recently?"     no 6. OTHER SYMPTOMS: "Do you have any symptoms?" (e.g., headache, chest pain, blurred vision, difficulty breathing, weakness)     no 7. PREGNANCY: "Is there any chance you are pregnant?" "When was your last menstrual period?"     na  Protocols used: Blood Pressure - High-A-AH

## 2021-09-09 ENCOUNTER — Encounter: Payer: Self-pay | Admitting: Family Medicine

## 2021-09-09 ENCOUNTER — Ambulatory Visit (INDEPENDENT_AMBULATORY_CARE_PROVIDER_SITE_OTHER): Payer: Medicare Other | Admitting: Family Medicine

## 2021-09-09 DIAGNOSIS — I10 Essential (primary) hypertension: Secondary | ICD-10-CM | POA: Diagnosis not present

## 2021-09-09 MED ORDER — LOSARTAN POTASSIUM 25 MG PO TABS: 25.0000 mg | ORAL_TABLET | Freq: Every day | ORAL | 2 refills | Status: DC

## 2021-09-09 NOTE — Progress Notes (Signed)
BP (!) 161/73   Pulse 66   Temp 98.2 F (36.8 C)   Wt 194 lb 9.6 oz (88.3 kg)   SpO2 100%   BMI 26.39 kg/m    Subjective:    Patient ID: Shane Duncan, male    DOB: [birth year], 71 y.o.   MRN: [id number]  HPI: Shane Duncan is a 71 y.o. male  Chief Complaint  Patient presents with   Hypertension    Patient states his BP has been running high since his last visit, states he has checked it inside pharmacies and it has been high.    HYPERTENSION Hypertension status: uncontrolled  Satisfied with current treatment? no Duration of hypertension: months BP monitoring frequency:  a few times a month BP medication side effects:  not on anything Medication compliance: not on anyting Previous BP meds: none Aspirin: no Recurrent headaches: no Visual changes: no Palpitations: no Dyspnea: no Chest pain: no Lower extremity edema: no Dizzy/lightheaded: no   Relevant past medical, surgical, family and social history reviewed and updated as indicated. Interim medical history since our last visit reviewed. Allergies and medications reviewed and updated.  Review of Systems  Constitutional: Negative.   Respiratory: Negative.    Cardiovascular: Negative.   Gastrointestinal: Negative.   Musculoskeletal: Negative.   Psychiatric/Behavioral: Negative.     Per HPI unless specifically indicated above     Objective:    BP (!) 161/73   Pulse 66   Temp 98.2 F (36.8 C)   Wt 194 lb 9.6 oz (88.3 kg)   SpO2 100%   BMI 26.39 kg/m   Wt Readings from Last 3 Encounters:  09/09/21 194 lb 9.6 oz (88.3 kg)  04/21/21 200 lb 9.6 oz (91 kg)  02/15/21 198 lb (89.8 kg)    Physical Exam Vitals and nursing note reviewed.  Constitutional:      General: He is not in acute distress.    Appearance: Normal appearance. He is normal weight. He is not ill-appearing, toxic-appearing or diaphoretic.  HENT:     Head: Normocephalic and atraumatic.     Right Ear: External ear normal.     Left  Ear: External ear normal.     Nose: Nose normal.     Mouth/Throat:     Mouth: Mucous membranes are moist.     Pharynx: Oropharynx is clear.  Eyes:     General: No scleral icterus.       Right eye: No discharge.        Left eye: No discharge.     Extraocular Movements: Extraocular movements intact.     Conjunctiva/sclera: Conjunctivae normal.     Pupils: Pupils are equal, round, and reactive to light.  Cardiovascular:     Rate and Rhythm: Normal rate and regular rhythm.     Pulses: Normal pulses.     Heart sounds: Normal heart sounds. No murmur heard.   No friction rub. No gallop.  Pulmonary:     Effort: Pulmonary effort is normal. No respiratory distress.     Breath sounds: Normal breath sounds. No stridor. No wheezing, rhonchi or rales.  Chest:     Chest wall: No tenderness.  Musculoskeletal:        General: Normal range of motion.     Cervical back: Normal range of motion and neck supple.  Skin:    General: Skin is warm and dry.     Capillary Refill: Capillary refill takes less than 2 seconds.     Coloration:  Skin is not jaundiced or pale.     Findings: No bruising, erythema, lesion or rash.  Neurological:     General: No focal deficit present.     Mental Status: He is alert and oriented to person, place, and time. Mental status is at baseline.  Psychiatric:        Mood and Affect: Mood normal.        Behavior: Behavior normal.        Thought Content: Thought content normal.        Judgment: Judgment normal.    Results for orders placed or performed in visit on 04/21/21  Comp Met (CMET)  Result Value Ref Range   Glucose 97 70 - 99 mg/dL   BUN 11 8 - 27 mg/dL   Creatinine, Ser 0.84 0.76 - 1.27 mg/dL   eGFR 95 >59 mL/min/1.73   BUN/Creatinine Ratio 13 10 - 24   Sodium 138 134 - 144 mmol/L   Potassium 3.9 3.5 - 5.2 mmol/L   Chloride 102 96 - 106 mmol/L   CO2 20 20 - 29 mmol/L   Calcium 9.3 8.6 - 10.2 mg/dL   Total Protein 6.9 6.0 - 8.5 g/dL   Albumin 4.4 3.8 - 4.8  g/dL   Globulin, Total 2.5 1.5 - 4.5 g/dL   Albumin/Globulin Ratio 1.8 1.2 - 2.2   Bilirubin Total 0.4 0.0 - 1.2 mg/dL   Alkaline Phosphatase 117 44 - 121 IU/L   AST 19 0 - 40 IU/L   ALT 12 0 - 44 IU/L  CBC w/Diff  Result Value Ref Range   WBC 4.3 3.4 - 10.8 x10E3/uL   RBC 5.59 4.14 - 5.80 x10E6/uL   Hemoglobin 15.5 13.0 - 17.7 g/dL   Hematocrit 47.1 37.5 - 51.0 %   MCV 84 79 - 97 fL   MCH 27.7 26.6 - 33.0 pg   MCHC 32.9 31.5 - 35.7 g/dL   RDW 13.6 11.6 - 15.4 %   Platelets 259 150 - 450 x10E3/uL   Neutrophils 59 Not Estab. %   Lymphs 24 Not Estab. %   Monocytes 13 Not Estab. %   Eos 3 Not Estab. %   Basos 1 Not Estab. %   Neutrophils Absolute 2.6 1.4 - 7.0 x10E3/uL   Lymphocytes Absolute 1.0 0.7 - 3.1 x10E3/uL   Monocytes Absolute 0.6 0.1 - 0.9 x10E3/uL   EOS (ABSOLUTE) 0.1 0.0 - 0.4 x10E3/uL   Basophils Absolute 0.0 0.0 - 0.2 x10E3/uL   Immature Granulocytes 0 Not Estab. %   Immature Grans (Abs) 0.0 0.0 - 0.1 x10E3/uL      Assessment & Plan:   Problem List Items Addressed This Visit       Cardiovascular and Mediastinum   Hypertension    Not under good control. Will start losartan and recheck 1 month with labs. Call with any concerns.        Relevant Medications   losartan (COZAAR) 25 MG tablet     Follow up plan: Return in about 4 weeks (around 10/07/2021).

## 2021-09-09 NOTE — Assessment & Plan Note (Signed)
Not under good control. Will start losartan and recheck 1 month with labs. Call with any concerns.

## 2021-10-11 ENCOUNTER — Ambulatory Visit (INDEPENDENT_AMBULATORY_CARE_PROVIDER_SITE_OTHER): Payer: Medicare Other | Admitting: Family Medicine

## 2021-10-11 ENCOUNTER — Encounter: Payer: Self-pay | Admitting: Family Medicine

## 2021-10-11 VITALS — BP 103/61 | HR 81 | Temp 98.1°F | Wt 195.0 lb

## 2021-10-11 DIAGNOSIS — I10 Essential (primary) hypertension: Secondary | ICD-10-CM | POA: Diagnosis not present

## 2021-10-11 MED ORDER — LOSARTAN POTASSIUM 25 MG PO TABS: 12.5000 mg | ORAL_TABLET | Freq: Every day | ORAL | 1 refills | Status: DC

## 2021-10-12 LAB — BASIC METABOLIC PANEL
BUN/Creatinine Ratio: 17 (ref 10–24)
BUN: 15 mg/dL (ref 8–27)
CO2: 21 mmol/L (ref 20–29)
Calcium: 9.3 mg/dL (ref 8.6–10.2)
Chloride: 105 mmol/L (ref 96–106)
Creatinine, Ser: 0.88 mg/dL (ref 0.76–1.27)
Glucose: 107 mg/dL — ABNORMAL HIGH (ref 70–99)
Potassium: 3.9 mmol/L (ref 3.5–5.2)
Sodium: 140 mmol/L (ref 134–144)
eGFR: 93 mL/min/{1.73_m2} (ref >59)

## 2021-10-29 ENCOUNTER — Ambulatory Visit: Payer: Self-pay | Admitting: *Deleted

## 2021-10-29 NOTE — Telephone Encounter (Signed)
Reason for Disposition  [1] Continuous (nonstop) coughing interferes with work or school AND [2] no improvement using cough treatment per Care Advice  Answer Assessment - Initial Assessment Questions 1. RESPIRATORY STATUS: "Describe your breathing?" (e.g., wheezing, shortness of breath, unable to speak, severe coughing)      For 5 days I'm taking cold medications.   But my chest is heavy like bronchitis.   It woke me up this morning.   Dry cough. 2. ONSET: "When did this breathing problem begin?"      For 5 days ago 3. PATTERN "Does the difficult breathing come and go, or has it been constant since it started?"      constant 4. SEVERITY: "How bad is your breathing?" (e.g., mild, moderate, severe)    - MILD: No SOB at rest, mild SOB with walking, speaks normally in sentences, can lie down, no retractions, pulse < 100.    - MODERATE: SOB at rest, SOB with minimal exertion and prefers to sit, cannot lie down flat, speaks in phrases, mild retractions, audible wheezing, pulse 100-120.    - SEVERE: Very SOB at rest, speaks in single words, struggling to breathe, sitting hunched forward, retractions, pulse > 120      Headache too.  My chest is heavy.   I have autoimmune disorder.    My nose a little runny.   Not coughing up anything.   No fever.   5. RECURRENT SYMPTOM: "Have you had difficulty breathing before?" If Yes, ask: "When was the last time?" and "What happened that time?"      Yes with bronchitis Not breaking out in sweats.    6. CARDIAC HISTORY: "Do you have any history of heart disease?" (e.g., heart attack, angina, bypass surgery, angioplasty)      No 7. LUNG HISTORY: "Do you have any history of lung disease?"  (e.g., pulmonary embolus, asthma, emphysema)     No history.    Smoke pot 8. CAUSE: "What do you think is causing the breathing problem?"      Stuck in my chest 9. OTHER SYMPTOMS: "Do you have any other symptoms? (e.g., dizziness, runny nose, cough, chest pain, fever)     Nose a  little runny, dry cough. 10. O2 SATURATION MONITOR:  "Do you use an oxygen saturation monitor (pulse oximeter) at home?" If Yes, "What is your reading (oxygen level) today?" "What is your usual oxygen saturation reading?" (e.g., 95%)       No 11. PREGNANCY: "Is there any chance you are pregnant?" "When was your last menstrual period?"       N/A 12. TRAVEL: "Have you traveled out of the country in the last month?" (e.g., travel history, exposures)       On way the home from the beach now.   Be home about 4:30.  Answer Assessment - Initial Assessment Questions 1. ONSET: "When did the cough begin?"      5 days ago  Feels like when I have bronchitis.  I'm driving home from the beach right now.   I won't be in town until 4:30 today.    2. SEVERITY: "How bad is the cough today?"      I have a dry cough and feel heavy in my chest.   Usually a round of antibiotics clears me up.  3. SPUTUM: "Describe the color of your sputum" (none, dry cough; clear, white, yellow, green)     Not coughing up anything 4. HEMOPTYSIS: "Are you coughing up any blood?"  If so ask: "How much?" (flecks, streaks, tablespoons, etc.)     No 5. DIFFICULTY BREATHING: "Are you having difficulty breathing?" If Yes, ask: "How bad is it?" (e.g., mild, moderate, severe)    - MILD: No SOB at rest, mild SOB with walking, speaks normally in sentences, can lie down, no retractions, pulse < 100.    - MODERATE: SOB at rest, SOB with minimal exertion and prefers to sit, cannot lie down flat, speaks in phrases, mild retractions, audible wheezing, pulse 100-120.    - SEVERE: Very SOB at rest, speaks in single words, struggling to breathe, sitting hunched forward, retractions, pulse > 120      Very mild shortness of breath and feeling heavy in my chest and this dry cough. 6. FEVER: "Do you have a fever?" If Yes, ask: "What is your temperature, how was it measured, and when did it start?"     No 7. CARDIAC HISTORY: "Do you have any history of  heart disease?" (e.g., heart attack, congestive heart failure)      No 8. LUNG HISTORY: "Do you have any history of lung disease?"  (e.g., pulmonary embolus, asthma, emphysema)     I smoke marijuana  9. PE RISK FACTORS: "Do you have a history of blood clots?" (or: recent major surgery, recent prolonged travel, bedridden)     Not asked 10. OTHER SYMPTOMS: "Do you have any other symptoms?" (e.g., runny nose, wheezing, chest pain)       No sore throat or wheezing.  Mildly runny nose.  No fever 11. PREGNANCY: "Is there any chance you are pregnant?" "When was your last menstrual period?"       N/A 12. TRAVEL: "Have you traveled out of the country in the last month?" (e.g., travel history, exposures)       Coming home from beach now.  Protocols used: Breathing Difficulty-A-AH, Cough - Acute Productive-A-AH  Chief Complaint: Dry cough, bronchitis, it feels like, chest feels heavy, a little short of breath Symptoms: Going on for 5 days.   Feels like when I have bronchitis but not this bad.   Frequency: For 5 days.   Wakes him up at night Pertinent Negatives: Patient denies fever or coughing up blood.   Denies breaking out in a sweat or cardiac history. Disposition: [] ED /[] Urgent Care (no appt availability in office) / [] Appointment(In office/virtual)/ [x]  [facility]/ [] Home Care/ [] Refused Recommended Disposition /[] [facility]/ []  Follow-up with PCP Additional Notes: He was agreeable to doing a MyChart virtual visit when he arrives home this evening.   He thanked me for the suggestion since there are no appts. This evening.

## 2021-11-01 DIAGNOSIS — Z6826 Body mass index (BMI) 26.0-26.9, adult: Secondary | ICD-10-CM | POA: Diagnosis not present

## 2021-11-01 DIAGNOSIS — M1990 Unspecified osteoarthritis, unspecified site: Secondary | ICD-10-CM | POA: Diagnosis not present

## 2021-11-01 DIAGNOSIS — Z79899 Other long term (current) drug therapy: Secondary | ICD-10-CM | POA: Diagnosis not present

## 2021-12-05 ENCOUNTER — Other Ambulatory Visit: Payer: Self-pay | Admitting: Family Medicine

## 2021-12-07 NOTE — Telephone Encounter (Signed)
this dose dc'd 10/11/21 by Dr Laural Benes "reorder" dose decreased   Requested Prescriptions  Refused Prescriptions Disp Refills  . losartan (COZAAR) 25 MG tablet [Pharmacy Med Name: LOSARTAN POTASSIUM 25 MG TAB] 90 tablet     Sig: TAKE 1 TABLET (25 MG TOTAL) BY MOUTH DAILY.     Cardiovascular:  Angiotensin Receptor Blockers Passed - 12/05/2021  9:30 AM      Passed - Cr in normal range and within 180 days    Creatinine, Ser  Date Value Ref Range Status  10/11/2021 0.88 0.76 - 1.27 mg/dL Final         Passed - K in normal range and within 180 days    Potassium  Date Value Ref Range Status  10/11/2021 3.9 3.5 - 5.2 mmol/L Final         Passed - Patient is not pregnant      Passed - Last BP in normal range    BP Readings from Last 1 Encounters:  10/11/21 103/61         Passed - Valid encounter within last 6 months    Recent Outpatient Visits          1 month ago Primary hypertension   [facility] [place], [place], DO   2 months ago Primary hypertension   [facility] [place], Megan P, DO   7 months ago Right upper quadrant abdominal pain   [facility] Larae Grooms, NP   1 year ago COVID-19   [facility] [place], [place], MD   1 year ago Benign prostatic hyperplasia with nocturia   [facility] [place], [place], DO      Future Appointments            In 1 month Johnson, Oralia Rud, DO [facility], [facility]   In 2 months  [facility], [facility]

## 2022-01-11 ENCOUNTER — Ambulatory Visit (INDEPENDENT_AMBULATORY_CARE_PROVIDER_SITE_OTHER): Payer: Medicare Other | Admitting: Family Medicine

## 2022-01-11 ENCOUNTER — Encounter: Payer: Self-pay | Admitting: Family Medicine

## 2022-01-11 VITALS — BP 99/61 | HR 60 | Temp 97.8°F | Ht 72.0 in | Wt 194.7 lb

## 2022-01-11 DIAGNOSIS — I10 Essential (primary) hypertension: Secondary | ICD-10-CM | POA: Diagnosis not present

## 2022-01-11 NOTE — Progress Notes (Signed)
BP 99/61   Pulse 60   Temp 97.8 F (36.6 C)   Ht 6' (1.829 m)   Wt 194 lb 11.2 oz (88.3 kg)   SpO2 99%   BMI 26.41 kg/m    Subjective:    Patient ID: Shane Duncan, male    DOB: Jul 10, 1950, 71 y.o.   MRN: [id number]  HPI: ZACCHAEUS HALM is a 71 y.o. male  Chief Complaint  Patient presents with   Hypertension    Patient here to follow up on low BP, states he has been cutting his 81m of losartan in half.    HYPERTENSION  Hypertension status:  overtreated   Satisfied with current treatment? yes Duration of hypertension: chronic BP monitoring frequency:  not checking BP medication side effects:  no Medication compliance: excellent compliance Previous BP meds:losartan Aspirin: no Recurrent headaches: no Visual changes: no Palpitations: no Dyspnea: no Chest pain: no Lower extremity edema: no Dizzy/lightheaded: yes  Relevant past medical, surgical, family and social history reviewed and updated as indicated. Interim medical history since our last visit reviewed. Allergies and medications reviewed and updated.  Review of Systems  Constitutional: Negative.   Respiratory: Negative.    Cardiovascular: Negative.   Gastrointestinal: Negative.   Musculoskeletal: Negative.   Neurological: Negative.   Psychiatric/Behavioral: Negative.      Per HPI unless specifically indicated above     Objective:    BP 99/61   Pulse 60   Temp 97.8 F (36.6 C)   Ht 6' (1.829 m)   Wt 194 lb 11.2 oz (88.3 kg)   SpO2 99%   BMI 26.41 kg/m   Wt Readings from Last 3 Encounters:  01/11/22 194 lb 11.2 oz (88.3 kg)  10/11/21 195 lb (88.5 kg)  09/09/21 194 lb 9.6 oz (88.3 kg)    Physical Exam Vitals and nursing note reviewed.  Constitutional:      General: He is not in acute distress.    Appearance: Normal appearance. He is normal weight. He is not ill-appearing, toxic-appearing or diaphoretic.  HENT:     Head: Normocephalic and atraumatic.     Right Ear: External ear  normal.     Left Ear: External ear normal.     Nose: Nose normal.     Mouth/Throat:     Mouth: Mucous membranes are moist.     Pharynx: Oropharynx is clear.  Eyes:     General: No scleral icterus.       Right eye: No discharge.        Left eye: No discharge.     Extraocular Movements: Extraocular movements intact.     Conjunctiva/sclera: Conjunctivae normal.     Pupils: Pupils are equal, round, and reactive to light.  Cardiovascular:     Rate and Rhythm: Normal rate and regular rhythm.     Pulses: Normal pulses.     Heart sounds: Normal heart sounds. No murmur heard.    No friction rub. No gallop.  Pulmonary:     Effort: Pulmonary effort is normal. No respiratory distress.     Breath sounds: Normal breath sounds. No stridor. No wheezing, rhonchi or rales.  Chest:     Chest wall: No tenderness.  Musculoskeletal:        General: Normal range of motion.     Cervical back: Normal range of motion and neck supple.  Skin:    General: Skin is warm and dry.     Capillary Refill: Capillary refill takes less than 2  seconds.     Coloration: Skin is not jaundiced or pale.     Findings: No bruising, erythema, lesion or rash.  Neurological:     General: No focal deficit present.     Mental Status: He is alert and oriented to person, place, and time. Mental status is at baseline.  Psychiatric:        Mood and Affect: Mood normal.        Behavior: Behavior normal.        Thought Content: Thought content normal.        Judgment: Judgment normal.     Results for orders placed or performed in visit on 18/56/31  Basic metabolic panel  Result Value Ref Range   Glucose 107 (H) 70 - 99 mg/dL   BUN 15 8 - 27 mg/dL   Creatinine, Ser 0.88 0.76 - 1.27 mg/dL   eGFR 93 >59 mL/min/1.73   BUN/Creatinine Ratio 17 10 - 24   Sodium 140 134 - 144 mmol/L   Potassium 3.9 3.5 - 5.2 mmol/L   Chloride 105 96 - 106 mmol/L   CO2 21 20 - 29 mmol/L   Calcium 9.3 8.6 - 10.2 mg/dL      Assessment & Plan:    Problem List Items Addressed This Visit       Cardiovascular and Mediastinum   Hypertension - Primary    BP remains low. Will stop losartan and recheck 1-2 months. Call with any concerns.        Follow up plan: Return for after 10/31 for wellness.

## 2022-01-11 NOTE — Assessment & Plan Note (Signed)
BP remains low. Will stop losartan and recheck 1-2 months. Call with any concerns.

## 2022-02-16 ENCOUNTER — Ambulatory Visit: Payer: Medicare Other

## 2022-02-16 ENCOUNTER — Ambulatory Visit (INDEPENDENT_AMBULATORY_CARE_PROVIDER_SITE_OTHER): Payer: Medicare Other | Admitting: *Deleted

## 2022-02-16 DIAGNOSIS — Z0000 Encounter for general adult medical examination without abnormal findings: Secondary | ICD-10-CM

## 2022-02-16 NOTE — Patient Instructions (Signed)
Shane Duncan , Thank you for taking time to come for your Medicare Wellness Visit. I appreciate your ongoing commitment to your health goals. Please review the following plan we discussed and let me know if I can assist you in the future.   These are the goals we discussed:  Goals      Patient Stated     02/14/2020, wants to weigh 200 pounds     Patient Stated     02/15/2021, wants to get stronger     Patient Stated     Maintain current lifestyle        This is a list of the screening recommended for you and due dates:  Health Maintenance  Topic Date Due   Tetanus Vaccine  12/29/2020   COVID-19 Vaccine (7 - Pfizer risk series) 03/04/2022*   Zoster (Shingles) Vaccine (1 of 2) 05/19/2022*   Flu Shot  07/17/2022*   Medicare Annual Wellness Visit  02/17/2023   Colon Cancer Screening  01/10/2024   Pneumonia Vaccine  Completed   Hepatitis C Screening: USPSTF Recommendation to screen - Ages 18-79 yo.  Completed   HPV Vaccine  Aged Out  *Topic was postponed. The date shown is not the original due date.    Advanced directives: yes  Conditions/risks identified:    Preventive Care 19 Years and Older, Male  Preventive care refers to lifestyle choices and visits with your health care provider that can promote health and wellness. What does preventive care include? A yearly physical exam. This is also called an annual well check. Dental exams once or twice a year. Routine eye exams. Ask your health care provider how often you should have your eyes checked. Personal lifestyle choices, including: Daily care of your teeth and gums. Regular physical activity. Eating a healthy diet. Avoiding tobacco and drug use. Limiting alcohol use. Practicing safe sex. Taking low doses of aspirin every day. Taking vitamin and mineral supplements as recommended by your health care provider. What happens during an annual well check? The services and screenings done by your health care provider  during your annual well check will depend on your age, overall health, lifestyle risk factors, and family history of disease. Counseling  Your health care provider may ask you questions about your: Alcohol use. Tobacco use. Drug use. Emotional well-being. Home and relationship well-being. Sexual activity. Eating habits. History of falls. Memory and ability to understand (cognition). Work and work Statistician. Screening  You may have the following tests or measurements: Height, weight, and BMI. Blood pressure. Lipid and cholesterol levels. These may be checked every 5 years, or more frequently if you are over 36 years old. Skin check. Lung cancer screening. You may have this screening every year starting at age 75 if you have a 30-pack-year history of smoking and currently smoke or have quit within the past 15 years. Fecal occult blood test (FOBT) of the stool. You may have this test every year starting at age 88. Flexible sigmoidoscopy or colonoscopy. You may have a sigmoidoscopy every 5 years or a colonoscopy every 10 years starting at age 25. Prostate cancer screening. Recommendations will vary depending on your family history and other risks. Hepatitis C blood test. Hepatitis B blood test. Sexually transmitted disease (STD) testing. Diabetes screening. This is done by checking your blood sugar (glucose) after you have not eaten for a while (fasting). You may have this done every 1-3 years. Abdominal aortic aneurysm (AAA) screening. You may need this if you are a current  or former smoker. Osteoporosis. You may be screened starting at age 57 if you are at high risk. Talk with your health care provider about your test results, treatment options, and if necessary, the need for more tests. Vaccines  Your health care provider may recommend certain vaccines, such as: Influenza vaccine. This is recommended every year. Tetanus, diphtheria, and acellular pertussis (Tdap, Td) vaccine. You  may need a Td booster every 10 years. Zoster vaccine. You may need this after age 8. Pneumococcal 13-valent conjugate (PCV13) vaccine. One dose is recommended after age 18. Pneumococcal polysaccharide (PPSV23) vaccine. One dose is recommended after age 31. Talk to your health care provider about which screenings and vaccines you need and how often you need them. This information is not intended to replace advice given to you by your health care provider. Make sure you discuss any questions you have with your health care provider. Document Released: 05/01/2015 Document Revised: 12/23/2015 Document Reviewed: 02/03/2015 Elsevier Interactive Patient Education  2017 [place] Prevention in the Home Falls can cause injuries. They can happen to people of all ages. There are many things you can do to make your home safe and to help prevent falls. What can I do on the outside of my home? Regularly fix the edges of walkways and driveways and fix any cracks. Remove anything that might make you trip as you walk through a door, such as a raised step or threshold. Trim any bushes or trees on the path to your home. Use bright outdoor lighting. Clear any walking paths of anything that might make someone trip, such as rocks or tools. Regularly check to see if handrails are loose or broken. Make sure that both sides of any steps have handrails. Any raised decks and porches should have guardrails on the edges. Have any leaves, snow, or ice cleared regularly. Use sand or salt on walking paths during winter. Clean up any spills in your garage right away. This includes oil or grease spills. What can I do in the bathroom? Use night lights. Install grab bars by the toilet and in the tub and shower. Do not use towel bars as grab bars. Use non-skid mats or decals in the tub or shower. If you need to sit down in the shower, use a plastic, non-slip stool. Keep the floor dry. Clean up any water that spills  on the floor as soon as it happens. Remove soap buildup in the tub or shower regularly. Attach bath mats securely with double-sided non-slip rug tape. Do not have throw rugs and other things on the floor that can make you trip. What can I do in the bedroom? Use night lights. Make sure that you have a light by your bed that is easy to reach. Do not use any sheets or blankets that are too big for your bed. They should not hang down onto the floor. Have a firm chair that has side arms. You can use this for support while you get dressed. Do not have throw rugs and other things on the floor that can make you trip. What can I do in the kitchen? Clean up any spills right away. Avoid walking on wet floors. Keep items that you use a lot in easy-to-reach places. If you need to reach something above you, use a strong step stool that has a grab bar. Keep electrical cords out of the way. Do not use floor polish or wax that makes floors slippery. If you must use wax,  use non-skid floor wax. Do not have throw rugs and other things on the floor that can make you trip. What can I do with my stairs? Do not leave any items on the stairs. Make sure that there are handrails on both sides of the stairs and use them. Fix handrails that are broken or loose. Make sure that handrails are as long as the stairways. Check any carpeting to make sure that it is firmly attached to the stairs. Fix any carpet that is loose or worn. Avoid having throw rugs at the top or bottom of the stairs. If you do have throw rugs, attach them to the floor with carpet tape. Make sure that you have a light switch at the top of the stairs and the bottom of the stairs. If you do not have them, ask someone to add them for you. What else can I do to help prevent falls? Wear shoes that: Do not have high heels. Have rubber bottoms. Are comfortable and fit you well. Are closed at the toe. Do not wear sandals. If you use a stepladder: Make  sure that it is fully opened. Do not climb a closed stepladder. Make sure that both sides of the stepladder are locked into place. Ask someone to hold it for you, if possible. Clearly mark and make sure that you can see: Any grab bars or handrails. First and last steps. Where the edge of each step is. Use tools that help you move around (mobility aids) if they are needed. These include: Canes. Walkers. Scooters. Crutches. Turn on the lights when you go into a dark area. Replace any light bulbs as soon as they burn out. Set up your furniture so you have a clear path. Avoid moving your furniture around. If any of your floors are uneven, fix them. If there are any pets around you, be aware of where they are. Review your medicines with your doctor. Some medicines can make you feel dizzy. This can increase your chance of falling. Ask your doctor what other things that you can do to help prevent falls. This information is not intended to replace advice given to you by your health care provider. Make sure you discuss any questions you have with your health care provider. Document Released: 01/29/2009 Document Revised: 09/10/2015 Document Reviewed: 05/09/2014 Elsevier Interactive Patient Education  2017 [employer].

## 2022-02-16 NOTE — Progress Notes (Signed)
Subjective:   Shane Duncan is a 71 y.o. male who presents for Medicare Annual/Subsequent preventive examination.  I connected with  Shane Duncan on 02/16/22 by a  telephone enabled telemedicine application and verified that I am speaking with the correct person using two identifiers.   I discussed the limitations of evaluation and management by telemedicine. The patient expressed understanding and agreed to proceed.  Patient location: home  Provider location: Tele-health-home    Review of Systems     Cardiac Risk Factors include: advanced age (>51men, >78 women);male gender;obesity (BMI >30kg/m2)     Objective:    Today's Vitals   There is no height or weight on file to calculate BMI.     02/16/2022   11:04 AM 02/15/2021    9:47 AM 02/14/2020    9:47 AM 01/21/2019   11:25 AM 06/11/2018   10:41 AM 02/02/2018   11:42 AM  Advanced Directives  Does Patient Have a Medical Advance Directive? No Yes Yes Yes Yes Yes  Type of Special educational needs teacher of [place];Living will Healthcare Power of [place];Living will Living will;Healthcare Power of Attorney Living will;Healthcare Power of [employer] Power of [place];Living will  Does patient want to make changes to medical advance directive?     No - Patient declined   Copy of Healthcare Power of Attorney in Chart?  No - copy requested No - copy requested No - copy requested No - copy requested No - copy requested    Current Medications (verified) Outpatient Encounter Medications as of 02/16/2022  Medication Sig   celecoxib (CELEBREX) 100 MG capsule Take 100 mg by mouth daily as needed.   folic acid (FOLVITE) 1 MG tablet Take 1 tablet by mouth daily.   methotrexate (RHEUMATREX) 2.5 MG tablet Take 20 mg by mouth once a week.    No facility-administered encounter medications on file as of 02/16/2022.    Allergies (verified) Azathioprine, Lactose, and Penicillins   History: Past Medical History:   Diagnosis Date   Bronchitis    Chronic sinusitis    Esophagitis    Hemorrhoids    Hepatitis A    Hyperlipidemia    Hypertension    IFG (impaired fasting glucose)    Temporal arteritis (HCC) 01/2018   Past Surgical History:  Procedure Laterality Date   ACHILLES TENDON SURGERY Right 1997   ANUS SURGERY  1990's   anal fistula repair   ARTERY BIOPSY Left 02/05/2018   Procedure: BIOPSY TEMPORAL ARTERY;  Surgeon: Annice Needy, MD;  Location: [facility] ORS;  Service: Vascular;  Laterality: Left;   TONSILLECTOMY  1961   Family History  Problem Relation Age of Onset   Cancer Father        Colon   Hypertension Father    Hyperlipidemia Father    Ulcerative colitis Son    Social History   Socioeconomic History   Marital status: Married    Spouse name: Not on file   Number of children: Not on file   Years of education: Not on file   Highest education level: Associate degree: academic program  Occupational History   Occupation: retired   Tobacco Use   Smoking status: Never   Smokeless tobacco: Never  Vaping Use   Vaping Use: Never used  Substance and Sexual Activity   Alcohol use: Yes    Comment: occassional    Drug use: Yes    Types: Marijuana    Comment: daily    Sexual activity: Yes  Other Topics Concern   Not on file  Social History Narrative   Not on file   Social Determinants of Health   Financial Resource Strain: Low Risk  (02/16/2022)   Overall Financial Resource Strain (CARDIA)    Difficulty of Paying Living Expenses: Not hard at all  Food Insecurity: No Food Insecurity (02/16/2022)   Hunger Vital Sign    Worried About Running Out of Food in the Last Year: Never true    Ran Out of Food in the Last Year: Never true  Transportation Needs: No Transportation Needs (02/15/2021)   PRAPARE - Administrator, Civil Service (Medical): No    Lack of Transportation (Non-Medical): No  Physical Activity: Sufficiently Active (02/16/2022)   Exercise Vital Sign     Days of Exercise per Week: 5 days    Minutes of Exercise per Session: 40 min  Stress: No Stress Concern Present (02/16/2022)   [employer] of Occupational Health - Occupational Stress Questionnaire    Feeling of Stress : Not at all  Social Connections: Moderately Isolated (02/16/2022)   Social Connection and Isolation Panel [NHANES]    Frequency of Communication with Friends and Family: More than three times a week    Frequency of Social Gatherings with Friends and Family: More than three times a week    Attends Religious Services: Never    Database administrator or Organizations: No    Attends Engineer, structural: Never    Marital Status: Married    Tobacco Counseling Counseling given: Not Answered   Clinical Intake:     Pain : No/denies pain     Diabetes: No  How often do you need to have someone help you when you read instructions, pamphlets, or other written materials from your doctor or pharmacy?: 1 - Never  Diabetic?  Interpreter Needed?: No  Information entered by :: Remi Haggard LPN   Activities of Daily Living    02/16/2022   11:19 AM  In your present state of health, do you have any difficulty performing the following activities:  Hearing? 0  Vision? 0  Difficulty concentrating or making decisions? 0  Walking or climbing stairs? 0  Dressing or bathing? 0  Doing errands, shopping? 0  Preparing Food and eating ? N  Using the Toilet? N  In the past six months, have you accidently leaked urine? N  Do you have problems with loss of bowel control? N  Managing your Medications? N  Managing your Finances? N  Housekeeping or managing your Housekeeping? N    Patient Care Team: Dorcas Carrow, DO as PCP - General (Family Medicine)  Indicate any recent Medical Services you may have received from other than [facility] providers in the past year (date may be approximate).     Assessment:   This is a routine wellness examination for  Shane Duncan.  Hearing/Vision screen Hearing Screening - Comments:: No trouble hearing Vision Screening - Comments:: Up to date  Dietary issues and exercise activities discussed: Current Exercise Habits: Home exercise routine, Type of exercise: walking;strength training/weights;stretching, Time (Minutes): 50, Frequency (Times/Week): 4, Weekly Exercise (Minutes/Week): 200, Intensity: Moderate   Goals Addressed             This Visit's Progress    Patient Stated   On track    02/14/2020, wants to weigh 200 pounds     Patient Stated   On track    02/15/2021, wants to get stronger     Patient  Stated       Maintain current lifestyle       Depression Screen    02/16/2022   11:08 AM 01/11/2022    8:48 AM 10/11/2021    1:13 PM 02/15/2021    9:48 AM 09/29/2020    3:17 PM 04/09/2020   10:00 AM 02/14/2020    9:48 AM  PHQ 2/9 Scores  PHQ - 2 Score 0 0 0 0 0 0 0  PHQ- 9 Score 0 0 0        Fall Risk    02/16/2022   11:07 AM 01/11/2022    8:48 AM 02/15/2021    9:48 AM 09/29/2020    3:17 PM 04/09/2020    9:59 AM  Fall Risk   Falls in the past year? 0 0 0 0 0  Number falls in past yr: 0 0  0 0  Injury with Fall? 0 0  0 0  Risk for fall due to :  No Fall Risks No Fall Risks No Fall Risks No Fall Risks  Follow up Falls evaluation completed;Education provided;Falls prevention discussed Falls evaluation completed Falls evaluation completed;Education provided;Falls prevention discussed Falls evaluation completed Falls evaluation completed    FALL RISK PREVENTION PERTAINING TO THE HOME:  Any stairs in or around the home? Yes  If so, are there any without handrails? No  Home free of loose throw rugs in walkways, pet beds, electrical cords, etc? Yes  Adequate lighting in your home to reduce risk of falls? Yes   ASSISTIVE DEVICES UTILIZED TO PREVENT FALLS:  Life alert? No  Use of a cane, walker or w/c? No  Grab bars in the bathroom? No  Shower chair or bench in shower? No  Elevated  toilet seat or a handicapped toilet? No   TIMED UP AND GO:  Was the test performed? No .    Cognitive Function:        02/16/2022   11:05 AM 02/15/2021    9:51 AM 02/14/2020    9:51 AM  6CIT Screen  What Year? 0 points 0 points 0 points  What month? 0 points 0 points 0 points  What time? 0 points 0 points 0 points  Count back from 20 0 points 0 points 0 points  Months in reverse 0 points 0 points 0 points  Repeat phrase 0 points 2 points 0 points  Total Score 0 points 2 points 0 points    Immunizations Immunization History  Administered Date(s) Administered   Fluad Quad(high Dose 65+) 01/16/2019   PFIZER Comirnaty(Gray Top)Covid-19 Tri-Sucrose Vaccine 07/30/2020   PFIZER(Purple Top)SARS-COV-2 Vaccination 05/28/2019, 06/18/2019, 01/20/2020, 08/04/2020   Pfizer Covid-19 Vaccine Bivalent Booster 62yrs & up 02/04/2021   Pneumococcal Conjugate-13 01/16/2019   Pneumococcal Polysaccharide-23 04/09/2020   Tdap 12/30/2010    TDAP status: Due, Education has been provided regarding the importance of this vaccine. Advised may receive this vaccine at local pharmacy or Health Dept. Aware to provide a copy of the vaccination record if obtained from local pharmacy or Health Dept. Verbalized acceptance and understanding.  Flu Vaccine status: Due, Education has been provided regarding the importance of this vaccine. Advised may receive this vaccine at local pharmacy or Health Dept. Aware to provide a copy of the vaccination record if obtained from local pharmacy or Health Dept. Verbalized acceptance and understanding.  Pneumococcal vaccine status: Up to date  Covid-19 vaccine status: Information provided on how to obtain vaccines.   Qualifies for Shingles Vaccine? Yes   Zostavax completed No  Shingrix Completed?: No.    Education has been provided regarding the importance of this vaccine. Patient has been advised to call insurance company to determine out of pocket expense if they have  not yet received this vaccine. Advised may also receive vaccine at local pharmacy or Health Dept. Verbalized acceptance and understanding.  Screening Tests Health Maintenance  Topic Date Due   TETANUS/TDAP  12/29/2020   COVID-19 Vaccine (7 - Pfizer risk series) 03/04/2022 (Originally 04/01/2021)   Zoster Vaccines- Shingrix (1 of 2) 05/19/2022 (Originally 08/27/1969)   INFLUENZA VACCINE  07/17/2022 (Originally 11/16/2021)   Medicare Annual Wellness (AWV)  02/17/2023   COLONOSCOPY (Pts 45-56yrs Insurance coverage will need to be confirmed)  01/10/2024   Pneumonia Vaccine 37+ Years old  Completed   Hepatitis C Screening  Completed   HPV VACCINES  Aged Out    Health Maintenance  Health Maintenance Due  Topic Date Due   TETANUS/TDAP  12/29/2020    Colorectal cancer screening: Type of screening: Colonoscopy. Completed 2015. Repeat every 10 years  Lung Cancer Screening: (Low Dose CT Chest recommended if Age 88-80 years, 30 pack-year currently smoking OR have quit w/in 15years.) does not qualify.   Lung Cancer Screening Referral:   Additional Screening:  Hepatitis C Screening: does not qualify; Completed 2017  Vision Screening: Recommended annual ophthalmology exams for early detection of glaucoma and other disorders of the eye. Is the patient up to date with their annual eye exam?  Yes  Who is the provider or what is the name of the office in which the patient attends annual eye exams?  If pt is not established with a provider, would they like to be referred to a provider to establish care? No .   Dental Screening: Recommended annual dental exams for proper oral hygiene  Community Resource Referral / Chronic Care Management: CRR required this visit?  No   CCM required this visit?  No      Plan:     I have personally reviewed and noted the following in the patient's chart:   Medical and social history Use of alcohol, tobacco or illicit drugs  Current medications and  supplements including opioid prescriptions. Patient is not currently taking opioid prescriptions. Functional ability and status Nutritional status Physical activity Advanced directives List of other physicians Hospitalizations, surgeries, and ER visits in previous 12 months Vitals Screenings to include cognitive, depression, and falls Referrals and appointments  In addition, I have reviewed and discussed with patient certain preventive protocols, quality metrics, and best practice recommendations. A written personalized care plan for preventive services as well as general preventive health recommendations were provided to patient.     Leroy Kennedy, LPN   [id number]   Nurse Notes:

## 2022-02-17 ENCOUNTER — Encounter: Payer: Self-pay | Admitting: Family Medicine

## 2022-02-17 ENCOUNTER — Ambulatory Visit (INDEPENDENT_AMBULATORY_CARE_PROVIDER_SITE_OTHER): Payer: Medicare Other | Admitting: Family Medicine

## 2022-02-17 VITALS — BP 135/69 | HR 61 | Temp 97.8°F | Ht 72.0 in | Wt 193.0 lb

## 2022-02-17 DIAGNOSIS — I10 Essential (primary) hypertension: Secondary | ICD-10-CM

## 2022-02-17 NOTE — Progress Notes (Signed)
BP 135/69   Pulse 61   Temp 97.8 F (36.6 C)   Ht 6' (1.829 m)   Wt 193 lb (87.5 kg)   SpO2 97%   BMI 26.18 kg/m    Subjective:    Patient ID: Shane Duncan, male    DOB: 08/31/1950, 71 y.o.   MRN: [id number]  HPI: Shane Duncan is a 71 y.o. male  Chief Complaint  Patient presents with   Hypertension    Patient here to follow up on BP, was taken off losartan last visit.    HYPERTENSION  Hypertension status: stable  Satisfied with current treatment? yes Duration of hypertension: chronic BP monitoring frequency:  rarely BP range: 145/75 BP medication side effects:  no Medication compliance:  has been off medicine Previous BP meds:losartan Aspirin: no Recurrent headaches: no Visual changes: no Palpitations: no Dyspnea: no Chest pain: no Lower extremity edema: no Dizzy/lightheaded: no  Relevant past medical, surgical, family and social history reviewed and updated as indicated. Interim medical history since our last visit reviewed. Allergies and medications reviewed and updated.  Review of Systems  Constitutional: Negative.   Respiratory: Negative.    Cardiovascular: Negative.   Gastrointestinal: Negative.   Musculoskeletal: Negative.   Neurological: Negative.   Psychiatric/Behavioral: Negative.      Per HPI unless specifically indicated above     Objective:    BP 135/69   Pulse 61   Temp 97.8 F (36.6 C)   Ht 6' (1.829 m)   Wt 193 lb (87.5 kg)   SpO2 97%   BMI 26.18 kg/m   Wt Readings from Last 3 Encounters:  02/17/22 193 lb (87.5 kg)  01/11/22 194 lb 11.2 oz (88.3 kg)  10/11/21 195 lb (88.5 kg)    Physical Exam Vitals and nursing note reviewed.  Constitutional:      General: He is not in acute distress.    Appearance: Normal appearance. He is not ill-appearing, toxic-appearing or diaphoretic.  HENT:     Head: Normocephalic and atraumatic.     Right Ear: External ear normal.     Left Ear: External ear normal.     Nose: Nose normal.      Mouth/Throat:     Mouth: Mucous membranes are moist.     Pharynx: Oropharynx is clear.  Eyes:     General: No scleral icterus.       Right eye: No discharge.        Left eye: No discharge.     Extraocular Movements: Extraocular movements intact.     Conjunctiva/sclera: Conjunctivae normal.     Pupils: Pupils are equal, round, and reactive to light.  Cardiovascular:     Rate and Rhythm: Normal rate and regular rhythm.     Pulses: Normal pulses.     Heart sounds: Normal heart sounds. No murmur heard.    No friction rub. No gallop.  Pulmonary:     Effort: Pulmonary effort is normal. No respiratory distress.     Breath sounds: Normal breath sounds. No stridor. No wheezing, rhonchi or rales.  Chest:     Chest wall: No tenderness.  Musculoskeletal:        General: Normal range of motion.     Cervical back: Normal range of motion and neck supple.  Skin:    General: Skin is warm and dry.     Capillary Refill: Capillary refill takes less than 2 seconds.     Coloration: Skin is not jaundiced or pale.  Findings: No bruising, erythema, lesion or rash.  Neurological:     General: No focal deficit present.     Mental Status: He is alert and oriented to person, place, and time. Mental status is at baseline.  Psychiatric:        Mood and Affect: Mood normal.        Behavior: Behavior normal.        Thought Content: Thought content normal.        Judgment: Judgment normal.     Results for orders placed or performed in visit on 37/85/88  Basic metabolic panel  Result Value Ref Range   Glucose 107 (H) 70 - 99 mg/dL   BUN 15 8 - 27 mg/dL   Creatinine, Ser 0.88 0.76 - 1.27 mg/dL   eGFR 93 >59 mL/min/1.73   BUN/Creatinine Ratio 17 10 - 24   Sodium 140 134 - 144 mmol/L   Potassium 3.9 3.5 - 5.2 mmol/L   Chloride 105 96 - 106 mmol/L   CO2 21 20 - 29 mmol/L   Calcium 9.3 8.6 - 10.2 mg/dL      Assessment & Plan:   Problem List Items Addressed This Visit       Cardiovascular  and Mediastinum   Hypertension - Primary    Doing well off medicine. BP under good control. Continue to monitor. Call with any concerns.       Relevant Orders   Basic metabolic panel     Follow up plan: Return in about 6 months (around 08/18/2022).

## 2022-02-17 NOTE — Assessment & Plan Note (Signed)
Doing well off medicine. BP under good control. Continue to monitor. Call with any concerns.  

## 2022-02-18 LAB — BASIC METABOLIC PANEL
BUN/Creatinine Ratio: 12 (ref 10–24)
BUN: 11 mg/dL (ref 8–27)
CO2: 23 mmol/L (ref 20–29)
Calcium: 9.1 mg/dL (ref 8.6–10.2)
Chloride: 107 mmol/L — ABNORMAL HIGH (ref 96–106)
Creatinine, Ser: 0.9 mg/dL (ref 0.76–1.27)
Glucose: 101 mg/dL — ABNORMAL HIGH (ref 70–99)
Potassium: 4.2 mmol/L (ref 3.5–5.2)
Sodium: 142 mmol/L (ref 134–144)
eGFR: 92 mL/min/{1.73_m2} (ref >59)

## 2022-06-20 DIAGNOSIS — I10 Essential (primary) hypertension: Secondary | ICD-10-CM | POA: Diagnosis not present

## 2022-06-20 DIAGNOSIS — M154 Erosive (osteo)arthritis: Secondary | ICD-10-CM | POA: Diagnosis not present

## 2022-06-20 DIAGNOSIS — M19041 Primary osteoarthritis, right hand: Secondary | ICD-10-CM | POA: Diagnosis not present

## 2022-06-20 DIAGNOSIS — H43392 Other vitreous opacities, left eye: Secondary | ICD-10-CM | POA: Diagnosis not present

## 2022-06-20 DIAGNOSIS — M19071 Primary osteoarthritis, right ankle and foot: Secondary | ICD-10-CM | POA: Diagnosis not present

## 2022-06-20 DIAGNOSIS — Z79631 Long term (current) use of antimetabolite agent: Secondary | ICD-10-CM | POA: Diagnosis not present

## 2022-06-20 DIAGNOSIS — E785 Hyperlipidemia, unspecified: Secondary | ICD-10-CM | POA: Diagnosis not present

## 2022-06-20 DIAGNOSIS — Z79899 Other long term (current) drug therapy: Secondary | ICD-10-CM | POA: Diagnosis not present

## 2022-06-20 DIAGNOSIS — M1990 Unspecified osteoarthritis, unspecified site: Secondary | ICD-10-CM | POA: Diagnosis not present

## 2022-06-20 DIAGNOSIS — M19072 Primary osteoarthritis, left ankle and foot: Secondary | ICD-10-CM | POA: Diagnosis not present

## 2022-06-20 DIAGNOSIS — M19042 Primary osteoarthritis, left hand: Secondary | ICD-10-CM | POA: Diagnosis not present

## 2022-06-23 DIAGNOSIS — Z79899 Other long term (current) drug therapy: Secondary | ICD-10-CM | POA: Diagnosis not present

## 2022-06-23 DIAGNOSIS — Z1159 Encounter for screening for other viral diseases: Secondary | ICD-10-CM | POA: Diagnosis not present

## 2022-07-13 DIAGNOSIS — G43B0 Ophthalmoplegic migraine, not intractable: Secondary | ICD-10-CM | POA: Diagnosis not present

## 2022-07-13 DIAGNOSIS — H2513 Age-related nuclear cataract, bilateral: Secondary | ICD-10-CM | POA: Diagnosis not present

## 2022-07-13 DIAGNOSIS — H43813 Vitreous degeneration, bilateral: Secondary | ICD-10-CM | POA: Diagnosis not present

## 2022-08-18 ENCOUNTER — Ambulatory Visit (INDEPENDENT_AMBULATORY_CARE_PROVIDER_SITE_OTHER): Payer: Medicare Other | Admitting: Family Medicine

## 2022-08-18 ENCOUNTER — Encounter: Payer: Self-pay | Admitting: Family Medicine

## 2022-08-18 VITALS — BP 130/76 | HR 73 | Temp 97.7°F | Ht 72.0 in | Wt 194.7 lb

## 2022-08-18 DIAGNOSIS — N401 Enlarged prostate with lower urinary tract symptoms: Secondary | ICD-10-CM

## 2022-08-18 DIAGNOSIS — D509 Iron deficiency anemia, unspecified: Secondary | ICD-10-CM | POA: Diagnosis not present

## 2022-08-18 DIAGNOSIS — E785 Hyperlipidemia, unspecified: Secondary | ICD-10-CM

## 2022-08-18 DIAGNOSIS — I10 Essential (primary) hypertension: Secondary | ICD-10-CM

## 2022-08-18 DIAGNOSIS — R351 Nocturia: Secondary | ICD-10-CM

## 2022-08-18 DIAGNOSIS — M1380 Other specified arthritis, unspecified site: Secondary | ICD-10-CM

## 2022-08-18 LAB — URINALYSIS, ROUTINE W REFLEX MICROSCOPIC
Bilirubin, UA: NEGATIVE
Glucose, UA: NEGATIVE
Ketones, UA: NEGATIVE
Leukocytes,UA: NEGATIVE
Nitrite, UA: NEGATIVE
Protein,UA: NEGATIVE
Specific Gravity, UA: 1.025 (ref 1.005–1.030)
Urobilinogen, Ur: 0.2 mg/dL (ref 0.2–1.0)
pH, UA: 5 (ref 5.0–7.5)

## 2022-08-18 LAB — MICROSCOPIC EXAMINATION
Bacteria, UA: NONE SEEN
Epithelial Cells (non renal): NONE SEEN /hpf (ref 0–10)

## 2022-08-18 LAB — MICROALBUMIN, URINE WAIVED
Creatinine, Urine Waived: 100 mg/dL (ref 10–300)
Microalb, Ur Waived: 30 mg/L — ABNORMAL HIGH (ref 0–19)
Microalb/Creat Ratio: <30 mg/g (ref <30)

## 2022-08-18 NOTE — Assessment & Plan Note (Signed)
Doing well without medication. Continue to monitor. Call with any concerns. Labs drawn today.

## 2022-08-18 NOTE — Assessment & Plan Note (Signed)
Doing well without medication. Continue to monitor. Call with any concerns. Labs drawn today.  

## 2022-08-18 NOTE — Progress Notes (Signed)
BP 130/76   Pulse 73   Temp 97.7 F (36.5 C) (Oral)   Ht 6' (1.829 m)   Wt 194 lb 11.2 oz (88.3 kg)   SpO2 97%   BMI 26.41 kg/m    Subjective:    Patient ID: Shane Duncan, male    DOB: Aug 19, 1950, 72 y.o.   MRN: [id number]  HPI: Shane Duncan is a 72 y.o. male  Chief Complaint  Patient presents with   Hypertension   HYPERTENSION / HYPERLIPIDEMIA Satisfied with current treatment? yes Duration of hypertension: chronic BP monitoring frequency: not checking BP medication side effects: no Past BP meds: none Duration of hyperlipidemia: chronic Cholesterol medication side effects: N/A Cholesterol supplements: none Past cholesterol medications: none Medication compliance: N/A Aspirin: no Recent stressors: no Recurrent headaches: no Visual changes: no Palpitations: no Dyspnea: no Chest pain: no Lower extremity edema: no Dizzy/lightheaded: no  BPH BPH status: controlled Satisfied with current treatment?: yes Medication side effects: N/A Duration: chronic Nocturia: 1/night Urinary frequency:no Incomplete voiding: no Urgency: no Weak urinary stream: no Straining to start stream: no Dysuria: no Onset: gradual Severity: mild   Relevant past medical, surgical, family and social history reviewed and updated as indicated. Interim medical history since our last visit reviewed. Allergies and medications reviewed and updated.  Review of Systems  Constitutional: Negative.   Respiratory: Negative.    Cardiovascular: Negative.   Gastrointestinal: Negative.   Musculoskeletal: Negative.   Skin: Negative.   Neurological: Negative.   Psychiatric/Behavioral: Negative.      Per HPI unless specifically indicated above     Objective:    BP 130/76   Pulse 73   Temp 97.7 F (36.5 C) (Oral)   Ht 6' (1.829 m)   Wt 194 lb 11.2 oz (88.3 kg)   SpO2 97%   BMI 26.41 kg/m   Wt Readings from Last 3 Encounters:  08/18/22 194 lb 11.2 oz (88.3 kg)  02/17/22 193 lb  (87.5 kg)  01/11/22 194 lb 11.2 oz (88.3 kg)    Physical Exam Vitals and nursing note reviewed.  Constitutional:      General: He is not in acute distress.    Appearance: Normal appearance. He is normal weight. He is not ill-appearing, toxic-appearing or diaphoretic.  HENT:     Head: Normocephalic and atraumatic.     Right Ear: External ear normal.     Left Ear: External ear normal.     Nose: Nose normal.     Mouth/Throat:     Mouth: Mucous membranes are moist.     Pharynx: Oropharynx is clear.  Eyes:     General: No scleral icterus.       Right eye: No discharge.        Left eye: No discharge.     Extraocular Movements: Extraocular movements intact.     Conjunctiva/sclera: Conjunctivae normal.     Pupils: Pupils are equal, round, and reactive to light.  Cardiovascular:     Rate and Rhythm: Normal rate and regular rhythm.     Pulses: Normal pulses.     Heart sounds: Normal heart sounds. No murmur heard.    No friction rub. No gallop.  Pulmonary:     Effort: Pulmonary effort is normal. No respiratory distress.     Breath sounds: Normal breath sounds. No stridor. No wheezing, rhonchi or rales.  Chest:     Chest wall: No tenderness.  Musculoskeletal:        General: Normal range of motion.  Cervical back: Normal range of motion and neck supple.  Skin:    General: Skin is warm and dry.     Capillary Refill: Capillary refill takes less than 2 seconds.     Coloration: Skin is not jaundiced or pale.     Findings: No bruising, erythema, lesion or rash.  Neurological:     General: No focal deficit present.     Mental Status: He is alert and oriented to person, place, and time. Mental status is at baseline.  Psychiatric:        Mood and Affect: Mood normal.        Behavior: Behavior normal.        Thought Content: Thought content normal.        Judgment: Judgment normal.     Results for orders placed or performed in visit on 02/17/22  Basic metabolic panel  Result Value  Ref Range   Glucose 101 (H) 70 - 99 mg/dL   BUN 11 8 - 27 mg/dL   Creatinine, Ser 1.61 0.76 - 1.27 mg/dL   eGFR 92 >09 UE/AVW/0.98   BUN/Creatinine Ratio 12 10 - 24   Sodium 142 134 - 144 mmol/L   Potassium 4.2 3.5 - 5.2 mmol/L   Chloride 107 (H) 96 - 106 mmol/L   CO2 23 20 - 29 mmol/L   Calcium 9.1 8.6 - 10.2 mg/dL      Assessment & Plan:   Problem List Items Addressed This Visit       Cardiovascular and Mediastinum   Hypertension - Primary    Doing well without medication. Continue to monitor. Call with any concerns. Labs drawn today.       Relevant Orders   Comprehensive metabolic panel   TSH   Urinalysis, Routine w reflex microscopic   Microalbumin, Urine Waived     Musculoskeletal and Integument   Seronegative arthritis    Continue to follow with rheumatology. Call with any concerns. Continue to monitor.         Genitourinary   BPH (benign prostatic hyperplasia)    Doing well without medication. Continue to monitor. Call with any concerns. Labs drawn today.       Relevant Orders   Comprehensive metabolic panel   PSA     Other   Hyperlipidemia    Doing well without medication. Continue to monitor. Call with any concerns. Labs drawn today.       Relevant Orders   Comprehensive metabolic panel   Lipid Panel w/o Chol/HDL Ratio   Microcytic anemia    Doing well without medication. Continue to monitor. Call with any concerns. Labs drawn today.       Relevant Orders   Comprehensive metabolic panel   CBC with Differential/Platelet     Follow up plan: Return in about 1 year (around 08/18/2023) for physical.

## 2022-08-18 NOTE — Assessment & Plan Note (Signed)
Continue to follow with rheumatology. Call with any concerns. Continue to monitor.  

## 2022-08-19 ENCOUNTER — Other Ambulatory Visit: Payer: Self-pay | Admitting: Family Medicine

## 2022-08-19 DIAGNOSIS — R972 Elevated prostate specific antigen [PSA]: Secondary | ICD-10-CM

## 2022-08-19 LAB — CBC WITH DIFFERENTIAL/PLATELET
Basophils Absolute: 0 10*3/uL (ref 0.0–0.2)
Basos: 1 %
EOS (ABSOLUTE): 0.1 10*3/uL (ref 0.0–0.4)
Eos: 2 %
Hematocrit: 43.6 % (ref 37.5–51.0)
Hemoglobin: 14.4 g/dL (ref 13.0–17.7)
Immature Grans (Abs): 0 10*3/uL (ref 0.0–0.1)
Immature Granulocytes: 0 %
Lymphocytes Absolute: 1.1 10*3/uL (ref 0.7–3.1)
Lymphs: 21 %
MCH: 28.5 pg (ref 26.6–33.0)
MCHC: 33 g/dL (ref 31.5–35.7)
MCV: 86 fL (ref 79–97)
Monocytes Absolute: 0.5 10*3/uL (ref 0.1–0.9)
Monocytes: 9 %
Neutrophils Absolute: 3.4 10*3/uL (ref 1.4–7.0)
Neutrophils: 67 %
Platelets: 231 10*3/uL (ref 150–450)
RBC: 5.06 x10E6/uL (ref 4.14–5.80)
RDW: 13.6 % (ref 11.6–15.4)
WBC: 5 10*3/uL (ref 3.4–10.8)

## 2022-08-19 LAB — COMPREHENSIVE METABOLIC PANEL
ALT: 9 IU/L (ref 0–44)
AST: 16 IU/L (ref 0–40)
Albumin/Globulin Ratio: 1.8 (ref 1.2–2.2)
Albumin: 4 g/dL (ref 3.9–4.9)
Alkaline Phosphatase: 101 IU/L (ref 44–121)
BUN/Creatinine Ratio: 16 (ref 10–24)
BUN: 14 mg/dL (ref 8–27)
Bilirubin Total: 0.5 mg/dL (ref 0.0–1.2)
CO2: 19 mmol/L — ABNORMAL LOW (ref 20–29)
Calcium: 9 mg/dL (ref 8.6–10.2)
Chloride: 107 mmol/L — ABNORMAL HIGH (ref 96–106)
Creatinine, Ser: 0.89 mg/dL (ref 0.76–1.27)
Globulin, Total: 2.2 g/dL (ref 1.5–4.5)
Glucose: 121 mg/dL — ABNORMAL HIGH (ref 70–99)
Potassium: 4.3 mmol/L (ref 3.5–5.2)
Sodium: 140 mmol/L (ref 134–144)
Total Protein: 6.2 g/dL (ref 6.0–8.5)
eGFR: 93 mL/min/{1.73_m2} (ref >59)

## 2022-08-19 LAB — LIPID PANEL W/O CHOL/HDL RATIO
Cholesterol, Total: 183 mg/dL (ref 100–199)
HDL: 45 mg/dL (ref >39)
LDL Chol Calc (NIH): 122 mg/dL — ABNORMAL HIGH (ref 0–99)
Triglycerides: 84 mg/dL (ref 0–149)
VLDL Cholesterol Cal: 16 mg/dL (ref 5–40)

## 2022-08-19 LAB — PSA: Prostate Specific Ag, Serum: 5.1 ng/mL — ABNORMAL HIGH (ref 0.0–4.0)

## 2022-08-19 LAB — TSH: TSH: 2.93 u[IU]/mL (ref 0.450–4.500)

## 2022-08-29 ENCOUNTER — Other Ambulatory Visit: Payer: Medicare Other

## 2022-08-29 DIAGNOSIS — R972 Elevated prostate specific antigen [PSA]: Secondary | ICD-10-CM | POA: Diagnosis not present

## 2022-08-30 LAB — PSA: Prostate Specific Ag, Serum: 5.8 ng/mL — ABNORMAL HIGH (ref 0.0–4.0)

## 2022-09-05 ENCOUNTER — Other Ambulatory Visit: Payer: Self-pay | Admitting: Family Medicine

## 2022-09-05 DIAGNOSIS — R972 Elevated prostate specific antigen [PSA]: Secondary | ICD-10-CM

## 2022-09-14 ENCOUNTER — Ambulatory Visit (INDEPENDENT_AMBULATORY_CARE_PROVIDER_SITE_OTHER): Payer: Medicare Other | Admitting: Urology

## 2022-09-14 VITALS — BP 144/87 | HR 63 | Ht 72.0 in | Wt 197.1 lb

## 2022-09-14 DIAGNOSIS — R3912 Poor urinary stream: Secondary | ICD-10-CM | POA: Diagnosis not present

## 2022-09-14 DIAGNOSIS — N401 Enlarged prostate with lower urinary tract symptoms: Secondary | ICD-10-CM

## 2022-09-14 DIAGNOSIS — R972 Elevated prostate specific antigen [PSA]: Secondary | ICD-10-CM | POA: Diagnosis not present

## 2022-09-14 DIAGNOSIS — R3915 Urgency of urination: Secondary | ICD-10-CM | POA: Diagnosis not present

## 2022-09-14 DIAGNOSIS — R351 Nocturia: Secondary | ICD-10-CM | POA: Diagnosis not present

## 2022-09-14 LAB — URINALYSIS, COMPLETE
Bilirubin, UA: NEGATIVE
Glucose, UA: NEGATIVE
Ketones, UA: NEGATIVE
Leukocytes,UA: NEGATIVE
Nitrite, UA: NEGATIVE
Protein,UA: NEGATIVE
Specific Gravity, UA: 1.015 (ref 1.005–1.030)
Urobilinogen, Ur: 0.2 mg/dL (ref 0.2–1.0)
pH, UA: 5.5 (ref 5.0–7.5)

## 2022-09-14 LAB — MICROSCOPIC EXAMINATION

## 2022-09-14 NOTE — Progress Notes (Signed)
Marcelle Overlie Plume,acting as a scribe for Vanna Scotland, MD.,have documented all relevant documentation on the behalf of Vanna Scotland, MD,as directed by  Vanna Scotland, MD while in the presence of Vanna Scotland, MD.  09/14/2022 9:18 AM   Bari Mantis 1950-08-16 [id number]  Referring provider: Dorcas Carrow, DO [address] [place],  [place] [address]  Chief Complaint  Patient presents with   Establish Care   Elevated PSA    HPI: 72 year-old male who presents today for further evaluation for a personal history of elevated PSA.   His PSA range has been in the 3-4 range. He had a bump 2 years ago of 4.9 in 2021. It rose again to 5.1 on 08/18/2022. Repeat was 5.8 on 08/29/2022. He does have a personal history of BPH. He is not currently on any BPH medications. He has not been previously evaluated by urology and has not undergone any cross-sectional imaging.   Today, he reports occasional weak urinary stream and nocturia, which he notes are not new symptoms.  He notes that his mother was a regular smoker and developed breast cancer.    Prostate Specific Ag, Serum  Latest Ref Rng 0.0 - 4.0 ng/mL  01/03/2018 3.0   03/28/2019 3.0   10/07/2019 4.9 (H)   11/07/2019 3.9   04/09/2020 3.8   08/18/2022 5.1 (H)   08/29/2022 5.8 (H)      PMH: Past Medical History:  Diagnosis Date   Bronchitis    Chronic sinusitis    Esophagitis    Hemorrhoids    Hepatitis A    Hyperlipidemia    Hypertension    IFG (impaired fasting glucose)    Temporal arteritis (HCC) 01/2018    Surgical History: Past Surgical History:  Procedure Laterality Date   ACHILLES TENDON SURGERY Right 1997   ANUS SURGERY  1990's   anal fistula repair   ARTERY BIOPSY Left 02/05/2018   Procedure: BIOPSY TEMPORAL ARTERY;  Surgeon: Annice Needy, MD;  Location: [facility] ORS;  Service: Vascular;  Laterality: Left;   TONSILLECTOMY  1961    Home Medications:  Allergies as of 09/14/2022       Reactions   Azathioprine Rash    Lactose Other (See Comments)   Lactose intolerant   Penicillins Rash        Medication List        Accurate as of Sep 14, 2022  9:18 AM. If you have any questions, ask your nurse or doctor.          celecoxib 100 MG capsule Commonly known as: CELEBREX Take 100 mg by mouth daily as needed.   folic acid 1 MG tablet Commonly known as: FOLVITE Take 1 tablet by mouth daily.   methotrexate 2.5 MG tablet Commonly known as: RHEUMATREX Take 20 mg by mouth once a week.        Allergies:  Allergies  Allergen Reactions   Azathioprine Rash   Lactose Other (See Comments)    Lactose intolerant   Penicillins Rash    Family History: Family History  Problem Relation Age of Onset   Cancer Father        Colon   Hypertension Father    Hyperlipidemia Father    Ulcerative colitis Son     Social History:  reports that he has never smoked. He has never used smokeless tobacco. He reports current alcohol use. He reports current drug use. Drug: Marijuana.   Physical Exam: BP (!) 165/79  Pulse 66   Ht 6' (1.829 m)   Wt 197 lb 2 oz (89.4 kg)   BMI 26.73 kg/m   Constitutional:  Alert and oriented, No acute distress. HEENT: [place] AT, moist mucus membranes.  Trachea midline, no masses. GU: Normal sphincter tone, 50+ gram prostate, non-tender, no nodularity. Neurologic: Grossly intact, no focal deficits, moving all 4 extremities. Psychiatric: Normal mood and affect.   Assessment & Plan:    1. Elevated PSA -  We reviewed the implications of an elevated PSA and the uncertainty surrounding it. In general, a man's PSA increases with age and is produced by both normal and cancerous prostate tissue. Differential for elevated PSA is BPH, prostate cancer, infection, recent intercourse/ejaculation, prostate infarction, recent urethroscopic manipulation (foley placement/cystoscopy) and prostatitis. Management of an elevated PSA can include observation or prostate biopsy and wediscussed this  in detail. We discussed that indications for prostate biopsy are defined by age and race specific PSA cutoffs as well as a PSA velocity of 0.75/year. - Continue to monitor PSA levels. Repeat PSA in three months. - If PSA continues to rise, consider prostate MRI or biopsy.  2. BPH - No current medication needed as symptoms are stable and not bothersome - Monitor urinary symptoms and reassess if the worsen - UA today to rule out infection  Return in about 3 months (around 12/15/2022) for repeat PSA.  I have reviewed the above documentation for accuracy and completeness, and I agree with the above.   Vanna Scotland, MD   [facility] [address] [place], [place] [address] [phone]

## 2022-09-14 NOTE — Progress Notes (Signed)
Shane Duncan presents for an office visit. BP today is __144/87_________. He is not on BP medication-patient states his b/p readings at home are normal. Greater than 140/90. Provider  notified. Pt advised to__f/u with PCP_________. Pt voiced understanding. procedure

## 2022-12-09 ENCOUNTER — Telehealth: Payer: Self-pay | Admitting: Family Medicine

## 2022-12-09 ENCOUNTER — Other Ambulatory Visit: Payer: Medicare Other

## 2022-12-09 NOTE — Telephone Encounter (Signed)
Pt is calling in because he tested positive for COVID on 12/08/22 , and wants to know if Dr. Laural Benes can send him in Paxlovid. Pt would like it to go to [facility] in [place] if possible. Please follow up with pt.

## 2022-12-13 ENCOUNTER — Ambulatory Visit: Payer: Medicare Other | Admitting: Urology

## 2022-12-13 DIAGNOSIS — L578 Other skin changes due to chronic exposure to nonionizing radiation: Secondary | ICD-10-CM | POA: Diagnosis not present

## 2022-12-13 DIAGNOSIS — L298 Other pruritus: Secondary | ICD-10-CM | POA: Diagnosis not present

## 2022-12-13 DIAGNOSIS — D2262 Melanocytic nevi of left upper limb, including shoulder: Secondary | ICD-10-CM | POA: Diagnosis not present

## 2022-12-13 DIAGNOSIS — L570 Actinic keratosis: Secondary | ICD-10-CM | POA: Diagnosis not present

## 2022-12-13 DIAGNOSIS — D225 Melanocytic nevi of trunk: Secondary | ICD-10-CM | POA: Diagnosis not present

## 2022-12-13 DIAGNOSIS — L820 Inflamed seborrheic keratosis: Secondary | ICD-10-CM | POA: Diagnosis not present

## 2022-12-13 DIAGNOSIS — D2261 Melanocytic nevi of right upper limb, including shoulder: Secondary | ICD-10-CM | POA: Diagnosis not present

## 2022-12-13 DIAGNOSIS — L538 Other specified erythematous conditions: Secondary | ICD-10-CM | POA: Diagnosis not present

## 2022-12-14 NOTE — Telephone Encounter (Signed)
Please get scheduled for virtual visit with mychart virtual

## 2022-12-14 NOTE — Telephone Encounter (Signed)
Patient is feeling better and no longer need appt.

## 2022-12-29 ENCOUNTER — Other Ambulatory Visit: Payer: Medicare Other

## 2022-12-29 DIAGNOSIS — R972 Elevated prostate specific antigen [PSA]: Secondary | ICD-10-CM

## 2022-12-30 LAB — PSA: Prostate Specific Ag, Serum: 6.9 ng/mL — ABNORMAL HIGH (ref 0.0–4.0)

## 2023-01-03 ENCOUNTER — Ambulatory Visit (INDEPENDENT_AMBULATORY_CARE_PROVIDER_SITE_OTHER): Payer: Medicare Other | Admitting: Urology

## 2023-01-03 VITALS — BP 154/77 | HR 77

## 2023-01-03 DIAGNOSIS — R972 Elevated prostate specific antigen [PSA]: Secondary | ICD-10-CM

## 2023-01-03 NOTE — Progress Notes (Signed)
I,Amy L Pierron,acting as a scribe for Vanna Scotland, MD.,have documented all relevant documentation on the behalf of Vanna Scotland, MD,as directed by  Vanna Scotland, MD while in the presence of Vanna Scotland, MD.  01/03/2023 11:01 AM   Shane Duncan [birth year] [id number]  Referring provider: Dorcas Carrow, DO [address] [place],  [place] [address]  Chief Complaint  Patient presents with   Benign Prostatic Hypertrophy   Elevated PSA    HPI: 72 year-old male with a personal history of elevated PSA presents today for six month follow-up.  His PSA trend is below and shows it was previously in the 3-4 range and had an elevation to 5.8. His rectal exam showed prostatomegaly. His most recent PSA from 12/29/2022 is 6.9.  He is not taking any blood thinners. He is on Methotrexate.     Prostate Specific Ag, Serum Latest Ref Rng 0.0 - 4.0 ng/mL 01/03/2018 3.0  03/28/2019 3.0  10/07/2019 4.9 (H)  11/07/2019 3.9  04/09/2020 3.8  08/18/2022 5.1 (H)  08/29/2022 5.8 (H)  12/29/2022        6.9   PMH: Past Medical History:  Diagnosis Date   Bronchitis    Chronic sinusitis    Esophagitis    Hemorrhoids    Hepatitis A    Hyperlipidemia    Hypertension    IFG (impaired fasting glucose)    Temporal arteritis (HCC) 01/2018    Surgical History: Past Surgical History:  Procedure Laterality Date   ACHILLES TENDON SURGERY Right 1997   ANUS SURGERY  1990's   anal fistula repair   ARTERY BIOPSY Left 02/05/2018   Procedure: BIOPSY TEMPORAL ARTERY;  Surgeon: Annice Needy, MD;  Location: [facility] ORS;  Service: Vascular;  Laterality: Left;   TONSILLECTOMY  1961    Home Medications:  Allergies as of 01/03/2023       Reactions   Azathioprine Rash   Lactose Other (See Comments)   Lactose intolerant   Penicillins Rash        Medication List        Accurate as of January 03, 2023 11:01 AM. If you have any questions, ask your nurse or doctor.          celecoxib 100 MG  capsule Commonly known as: CELEBREX Take 100 mg by mouth daily as needed.   folic acid 1 MG tablet Commonly known as: FOLVITE Take 1 tablet by mouth daily.   methotrexate 2.5 MG tablet Commonly known as: RHEUMATREX Take 20 mg by mouth once a week.        Allergies:  Allergies  Allergen Reactions   Azathioprine Rash   Lactose Other (See Comments)    Lactose intolerant   Penicillins Rash    Family History: Family History  Problem Relation Age of Onset   Cancer Father        Colon   Hypertension Father    Hyperlipidemia Father    Ulcerative colitis Son     Social History:  reports that he has never smoked. He has never used smokeless tobacco. He reports current alcohol use. He reports current drug use. Drug: Marijuana.   Physical Exam: BP (!) 154/77   Pulse 77   Constitutional:  Alert and oriented, No acute distress. HEENT: [place] AT, moist mucus membranes.  Trachea midline, no masses. Neurologic: Grossly intact, no focal deficits, moving all 4 extremities. Psychiatric: Normal mood and affect.   Assessment & Plan:    Elevated/ rising PSA  -  Recommend prostate biopsy. We discussed prostate biopsy in detail including the procedure itself, the risks of blood in the urine, stool, and ejaculate, serious infection, and discomfort. He is willing to proceed with this as discussed.  Return in about 4 weeks (around 01/31/2023) for prostate biopsy results.  [facility] [address] [place], [place] [address] [phone]

## 2023-01-03 NOTE — Patient Instructions (Signed)

## 2023-01-09 DIAGNOSIS — Z79631 Long term (current) use of antimetabolite agent: Secondary | ICD-10-CM | POA: Diagnosis not present

## 2023-01-09 DIAGNOSIS — M316 Other giant cell arteritis: Secondary | ICD-10-CM | POA: Diagnosis not present

## 2023-01-09 DIAGNOSIS — M0600 Rheumatoid arthritis without rheumatoid factor, unspecified site: Secondary | ICD-10-CM | POA: Diagnosis not present

## 2023-01-09 DIAGNOSIS — M06049 Rheumatoid arthritis without rheumatoid factor, unspecified hand: Secondary | ICD-10-CM | POA: Diagnosis not present

## 2023-01-31 ENCOUNTER — Ambulatory Visit: Payer: Medicare Other | Admitting: Urology

## 2023-01-31 DIAGNOSIS — Z2989 Encounter for other specified prophylactic measures: Secondary | ICD-10-CM

## 2023-01-31 DIAGNOSIS — R972 Elevated prostate specific antigen [PSA]: Secondary | ICD-10-CM

## 2023-01-31 MED ORDER — GENTAMICIN SULFATE 40 MG/ML IJ SOLN
80.0000 mg | Freq: Once | INTRAMUSCULAR | Status: AC
Start: 2023-01-31 — End: 2023-01-31
  Administered 2023-01-31: 80 mg via INTRAMUSCULAR

## 2023-01-31 MED ORDER — LEVOFLOXACIN 500 MG PO TABS
500.0000 mg | ORAL_TABLET | Freq: Once | ORAL | Status: AC
Start: 2023-01-31 — End: 2023-01-31
  Administered 2023-01-31: 500 mg via ORAL

## 2023-01-31 NOTE — Patient Instructions (Signed)

## 2023-01-31 NOTE — Progress Notes (Unsigned)
   01/31/23  CC:  Chief Complaint  Patient presents with   Prostate Biopsy    HPI: 72 yo M with elevated / rising PSA.    NED. A&Ox3.   No respiratory distress   Abd soft, NT, ND Normal sphincter tone  Prostate Biopsy Procedure   Informed consent was obtained after discussing risks/benefits of the procedure.  A time out was performed to ensure correct patient identity.  Pre-Procedure: - Gentamicin given prophylactically - Levaquin 500 mg administered PO -Transrectal Ultrasound performed revealing a 92.8 gm prostate -No significant hypoechoic or median lobe noted  Procedure: - Prostate block performed using 10 cc 1% lidocaine and biopsies taken from sextant areas, a total of 12 under ultrasound guidance.  Post-Procedure: - Patient tolerated the procedure well - He was counseled to seek immediate medical attention if experiences any severe pain, significant bleeding, or fevers - Return in one week to discuss biopsy results   Vanna Scotland, MD

## 2023-02-08 ENCOUNTER — Ambulatory Visit: Payer: Medicare Other | Admitting: Urology

## 2023-02-08 ENCOUNTER — Other Ambulatory Visit: Payer: Self-pay

## 2023-02-08 DIAGNOSIS — R972 Elevated prostate specific antigen [PSA]: Secondary | ICD-10-CM

## 2023-02-21 ENCOUNTER — Telehealth: Payer: Self-pay | Admitting: Family Medicine

## 2023-02-21 NOTE — Telephone Encounter (Signed)
Copied from CRM [id number]. Topic: Medicare AWV >> Feb 21, 2023  1:45 PM Payton Doughty wrote: Reason for CRM: Called LVM 02/21/2023 to schedule Annual Wellness Visit  Verlee Rossetti; Care Guide Ambulatory Clinical Support [place] [facility] Direct Dial: [phone]

## 2023-03-07 ENCOUNTER — Encounter: Payer: Self-pay | Admitting: Family Medicine

## 2023-03-24 ENCOUNTER — Encounter: Payer: Self-pay | Admitting: Family Medicine

## 2023-04-28 DIAGNOSIS — M316 Other giant cell arteritis: Secondary | ICD-10-CM | POA: Diagnosis not present

## 2023-04-28 DIAGNOSIS — M0600 Rheumatoid arthritis without rheumatoid factor, unspecified site: Secondary | ICD-10-CM | POA: Diagnosis not present

## 2023-05-04 DIAGNOSIS — H0261 Xanthelasma of right upper eyelid: Secondary | ICD-10-CM | POA: Diagnosis not present

## 2023-05-04 DIAGNOSIS — H0264 Xanthelasma of left upper eyelid: Secondary | ICD-10-CM | POA: Diagnosis not present

## 2023-07-10 DIAGNOSIS — D492 Neoplasm of unspecified behavior of bone, soft tissue, and skin: Secondary | ICD-10-CM | POA: Diagnosis not present

## 2023-07-11 DIAGNOSIS — H0261 Xanthelasma of right upper eyelid: Secondary | ICD-10-CM | POA: Diagnosis not present

## 2023-07-11 DIAGNOSIS — H0264 Xanthelasma of left upper eyelid: Secondary | ICD-10-CM | POA: Diagnosis not present

## 2023-07-26 ENCOUNTER — Other Ambulatory Visit: Payer: Self-pay

## 2023-07-26 DIAGNOSIS — R972 Elevated prostate specific antigen [PSA]: Secondary | ICD-10-CM | POA: Diagnosis not present

## 2023-07-27 LAB — PSA: Prostate Specific Ag, Serum: 4.7 ng/mL — ABNORMAL HIGH (ref 0.0–4.0)

## 2023-08-02 ENCOUNTER — Other Ambulatory Visit: Payer: Medicare Other

## 2023-08-08 ENCOUNTER — Ambulatory Visit (INDEPENDENT_AMBULATORY_CARE_PROVIDER_SITE_OTHER): Payer: Self-pay | Admitting: Urology

## 2023-08-08 VITALS — BP 155/81 | HR 86 | Ht 72.0 in | Wt 198.4 lb

## 2023-08-08 DIAGNOSIS — R972 Elevated prostate specific antigen [PSA]: Secondary | ICD-10-CM | POA: Diagnosis not present

## 2023-08-08 DIAGNOSIS — N401 Enlarged prostate with lower urinary tract symptoms: Secondary | ICD-10-CM

## 2023-08-08 DIAGNOSIS — R351 Nocturia: Secondary | ICD-10-CM

## 2023-08-08 NOTE — Progress Notes (Signed)
 Elfrieda Grise Plume,acting as a scribe for Dustin Gimenez, MD.,have documented all relevant documentation on the behalf of Dustin Gimenez, MD,as directed by  Dustin Gimenez, MD while in the presence of Dustin Gimenez, MD.  08/08/23 3:59 PM   Shane Duncan [birth year] [id number]  Referring provider: Solomon Dupre, DO [address] [place],  [place] [address]  Chief Complaint  Patient presents with   Elevated PSA    HPI: 73 year old male with a personal history of elevated PSA presents today for follow-up after prostate biopsy performed on 01-31-2023. He previously had PSA values in the 3-4 range, with a recent acute elevation to 6.9, prompting the biopsy.   Surgical pathology from the biopsy was benign.   His most recent PSA on 07/26/2023 was 4.7, which is back to his baseline. He has a markedly enlarged prostate (measured at 92.8 grams), which likely contributes to the chronically elevated PSA.   He denies any current significant urinary symptoms, reporting only nocturia a couple of times per night without urgency or leakage in the past several months.  He expresses satisfaction with current management and has no new complaints.   PMH: Past Medical History:  Diagnosis Date   Bronchitis    Chronic sinusitis    Esophagitis    Hemorrhoids    Hepatitis A    Hyperlipidemia    Hypertension    IFG (impaired fasting glucose)    Temporal arteritis (HCC) 01/2018    Surgical History: Past Surgical History:  Procedure Laterality Date   ACHILLES TENDON SURGERY Right 1997   ANUS SURGERY  1990's   anal fistula repair   ARTERY BIOPSY Left 02/05/2018   Procedure: BIOPSY TEMPORAL ARTERY;  Surgeon: Celso College, MD;  Location: [facility] ORS;  Service: Vascular;  Laterality: Left;   TONSILLECTOMY  1961    Home Medications:  Allergies as of 08/08/2023       Reactions   Azathioprine Rash   Lactose Other (See Comments)   Lactose intolerant   Penicillins Rash        Medication List         Accurate as of August 08, 2023  3:59 PM. If you have any questions, ask your nurse or doctor.          celecoxib 100 MG capsule Commonly known as: CELEBREX Take 100 mg by mouth daily as needed.   folic acid 1 MG tablet Commonly known as: FOLVITE Take 1 tablet by mouth daily.   methotrexate 2.5 MG tablet Commonly known as: RHEUMATREX Take 20 mg by mouth once a week.   Tocilizumab 162 MG/0.9ML Soaj Inject 162 mg into the skin.  Inject the contents of 1 pen (162 mg) under the skin every fourteen (14) days.        Allergies:  Allergies  Allergen Reactions   Azathioprine Rash   Lactose Other (See Comments)    Lactose intolerant   Penicillins Rash    Family History: Family History  Problem Relation Age of Onset   Cancer Father        Colon   Hypertension Father    Hyperlipidemia Father    Ulcerative colitis Son     Social History:  reports that he has never smoked. He has never used smokeless tobacco. He reports current alcohol use. He reports current drug use. Drug: Marijuana.   Physical Exam: BP (!) 155/81   Pulse 86   Ht 6' (1.829 m)   Wt 198 lb 6  oz (90 kg)   BMI 26.90 kg/m   Constitutional:  Alert and oriented, No acute distress. HEENT: [place] AT, moist mucus membranes.  Trachea midline, no masses. Neurologic: Grossly intact, no focal deficits, moving all 4 extremities. Psychiatric: Normal mood and affect.   Assessment & Plan:    1. Chronic elevated PSA with recent acute rise, now returned to baseline; history of benign prostate biopsy - Differential includes BPH with PSA elevation due to increased gland volume, prior inflammation/prostatitis, and less likely occult malignancy (given negative biopsy and PSA normalization). PSA density is appropriate for prostate size. - No current evidence of prostate cancer; recent biopsy negative and PSA has normalized. - Continue PSA surveillance annually with primary care or urology.  - He was educated on  expected PSA range (3-6 ng/mL) given large prostate.  - Advised to return for evaluation if PSA rises above this range or if new/worsening urinary symptoms develop.  - If future PSA elevation occurs, consider prostate MRI prior to repeat biopsy.  2. BPH with LUTS - He reports only mild nocturia, no urgency or incontinence, and is satisfied with current management.  - No indication for medical or surgical intervention at this time. - Monitor for progression of LUTS.  - He was advised to report any new or bothersome urinary symptoms.   Return in about 1 year (around 08/07/2024) for PSA surveillance or sooner if new urinary symptoms or PSA elevation above baseline range.  I have reviewed the above documentation for accuracy and completeness, and I agree with the above.   Dustin Gimenez, MD   [facility] [address], [address] [place], [place] [address] [phone]

## 2023-08-21 ENCOUNTER — Encounter: Payer: Medicare Other | Admitting: Family Medicine

## 2023-08-22 ENCOUNTER — Encounter: Payer: Medicare Other | Admitting: Family Medicine

## 2023-08-25 DIAGNOSIS — M0600 Rheumatoid arthritis without rheumatoid factor, unspecified site: Secondary | ICD-10-CM | POA: Diagnosis not present

## 2023-08-25 DIAGNOSIS — M316 Other giant cell arteritis: Secondary | ICD-10-CM | POA: Diagnosis not present

## 2023-08-25 DIAGNOSIS — Z79899 Other long term (current) drug therapy: Secondary | ICD-10-CM | POA: Diagnosis not present

## 2023-08-28 ENCOUNTER — Encounter: Payer: Self-pay | Admitting: Family Medicine

## 2023-08-28 ENCOUNTER — Ambulatory Visit: Payer: Self-pay | Admitting: Family Medicine

## 2023-08-28 VITALS — BP 142/72 | HR 72 | Temp 97.9°F | Ht 72.0 in | Wt 200.2 lb

## 2023-08-28 DIAGNOSIS — Z0000 Encounter for general adult medical examination without abnormal findings: Secondary | ICD-10-CM

## 2023-08-28 DIAGNOSIS — R351 Nocturia: Secondary | ICD-10-CM

## 2023-08-28 DIAGNOSIS — Z23 Encounter for immunization: Secondary | ICD-10-CM | POA: Diagnosis not present

## 2023-08-28 DIAGNOSIS — Z136 Encounter for screening for cardiovascular disorders: Secondary | ICD-10-CM

## 2023-08-28 DIAGNOSIS — I10 Essential (primary) hypertension: Secondary | ICD-10-CM | POA: Diagnosis not present

## 2023-08-28 DIAGNOSIS — N401 Enlarged prostate with lower urinary tract symptoms: Secondary | ICD-10-CM | POA: Diagnosis not present

## 2023-08-28 DIAGNOSIS — S41101A Unspecified open wound of right upper arm, initial encounter: Secondary | ICD-10-CM

## 2023-08-28 LAB — MICROALBUMIN, URINE WAIVED
Creatinine, Urine Waived: 200 mg/dL (ref 10–300)
Microalb, Ur Waived: 10 mg/L (ref 0–19)
Microalb/Creat Ratio: <30 mg/g (ref <30)

## 2023-08-28 NOTE — Progress Notes (Deleted)
 Annual Wellness Visit     Patient: Shane Duncan, Male    DOB: 10-05-1950, 73 y.o.   MRN: [id number]  Subjective  Chief Complaint  Patient presents with   Medicare Annual Wellness    Shane Duncan is a 73 y.o. male who presents today for his Annual Wellness Visit. He reports consuming a general diet. Home exercise routine includes walking 1 hrs per day. He generally feels well. He reports sleeping well. He does have additional problems to discuss today.   HPI  Medications: Outpatient Medications Prior to Visit  Medication Sig   celecoxib (CELEBREX) 100 MG capsule Take 100 mg by mouth daily as needed.   folic acid (FOLVITE) 1 MG tablet Take 1 tablet by mouth daily.   methotrexate (RHEUMATREX) 2.5 MG tablet Take 20 mg by mouth once a week.    Tocilizumab 162 MG/0.9ML SOAJ Inject 162 mg into the skin.  Inject the contents of 1 pen (162 mg) under the skin every fourteen (14) days.   No facility-administered medications prior to visit.    Allergies  Allergen Reactions   Azathioprine Rash   Lactose Other (See Comments)    Lactose intolerant   Penicillins Rash    Patient Care Team: Solomon Dupre, DO as PCP - General (Family Medicine)  ROS      Objective  BP (!) 169/92 (BP Location: Left Arm, Patient Position: Sitting, Cuff Size: Large)   Pulse 72   Ht 5\' 11"  (1.803 m)   Wt 200 lb 3.2 oz (90.8 kg)   SpO2 98%   BMI 27.92 kg/m  {Vitals History (Optional):23777}  Physical Exam    Most recent functional status assessment:     No data to display         Most recent fall risk assessment:    08/18/2022   11:24 AM  Fall Risk   Falls in the past year? 0  Number falls in past yr: 0  Injury with Fall? 0  Risk for fall due to : No Fall Risks  Follow up Falls evaluation completed    Most recent depression screenings:    08/18/2022   11:23 AM 02/16/2022   11:08 AM  PHQ 2/9 Scores  PHQ - 2 Score 0 0  PHQ- 9 Score 0 0   Most recent cognitive  screening:    08/28/2023    8:51 AM  6CIT Screen  What Year? 0 points  What month? 0 points  What time? 0 points  Count back from 20 0 points  Months in reverse 0 points  Repeat phrase 0 points  Total Score 0 points   Most recent Audit-C alcohol use screening    08/27/2023   11:14 AM  Alcohol Use Disorder Test (AUDIT)  1. How often do you have a drink containing alcohol? 4  2. How many drinks containing alcohol do you have on a typical day when you are drinking? 1  3. How often do you have six or more drinks on one occasion? 1  AUDIT-C Score 6   4. How often during the last year have you found that you were not able to stop drinking once you had started? 1  5. How often during the last year have you failed to do what was normally expected from you because of drinking? 0  6. How often during the last year have you needed a first drink in the morning to get yourself going after a heavy drinking session? 0  7. How often during the last year have you had a feeling of guilt of remorse after drinking? 1  8. How often during the last year have you been unable to remember what happened the night before because you had been drinking? 0  9. Have you or someone else been injured as a result of your drinking? 4  10. Has a relative or friend or a doctor or another health worker been concerned about your drinking or suggested you cut down? 0  Alcohol Use Disorder Identification Test Final Score (AUDIT) 12      Patient-reported   A score of 3 or more in women, and 4 or more in men indicates increased risk for alcohol abuse, EXCEPT if all of the points are from question 1   Vision/Hearing Screen: No results found.  {Labs (Optional):23779}  No results found for any visits on 08/28/23.    Assessment & Plan   Annual wellness visit done today including the all of the following: Reviewed patient's Family Medical History Reviewed and updated list of patient's medical providers Assessment of  cognitive impairment was done Assessed patient's functional ability Established a written schedule for health screening services Health Risk Assessent Completed and Reviewed  Exercise Activities and Dietary recommendations  Goals      Patient Stated     02/14/2020, wants to weigh 200 pounds     Patient Stated     02/15/2021, wants to get stronger     Patient Stated     Maintain current lifestyle        Immunization History  Administered Date(s) Administered   Fluad Quad(high Dose 65+) 01/16/2019   PFIZER(Purple Top)SARS-COV-2 Vaccination 05/28/2019, 06/18/2019, 01/20/2020, 08/04/2020   Pfizer Covid-19 Vaccine Bivalent Booster 26yrs & up 02/04/2021   Pneumococcal Conjugate-13 01/16/2019   Pneumococcal Polysaccharide-23 04/09/2020   Tdap 12/30/2010    Health Maintenance  Topic Date Due   Zoster Vaccines- Shingrix (1 of 2) Never done   DTaP/Tdap/Td (2 - Td or Tdap) 12/29/2020   COVID-19 Vaccine (7 - 2024-25 season) 12/18/2022   Medicare Annual Wellness (AWV)  02/17/2023   INFLUENZA VACCINE  11/17/2023   Colonoscopy  01/10/2024   Pneumonia Vaccine 52+ Years old  Completed   Hepatitis C Screening  Completed   HPV VACCINES  Aged Out   Meningococcal B Vaccine  Aged Out     Discussed health benefits of physical activity, and encouraged him to engage in regular exercise appropriate for his age and condition.    Problem List Items Addressed This Visit       Cardiovascular and Mediastinum   Hypertension - Primary   Relevant Orders   Comprehensive metabolic panel with GFR   CBC with Differential/Platelet   TSH   Microalbumin, Urine Waived     Genitourinary   BPH (benign prostatic hyperplasia)   Relevant Orders   Comprehensive metabolic panel with GFR   CBC with Differential/Platelet   PSA   Other Visit Diagnoses       Screening for cardiovascular condition       Relevant Orders   Lipid Panel w/o Chol/HDL Ratio       No follow-ups on file.     Darnella Elder, CMA

## 2023-08-28 NOTE — Assessment & Plan Note (Signed)
Under good control on current regimen. Continue current regimen. Continue to monitor. Call with any concerns. Labs drawn today.  

## 2023-08-28 NOTE — Progress Notes (Signed)
 Subjective:   Shane Duncan is a 73 y.o. male who presents for Medicare Annual/Subsequent preventive examination.  Visit Complete: In person  Patient Medicare AWV questionnaire was completed by the patient on 08/28/23; I have confirmed that all information answered by patient is correct and no changes since this date.        Objective:    Today's Vitals   08/28/23 0844 08/28/23 0855  BP: (!) 169/92 (!) 169/92  Pulse: 72 72  Temp:  97.9 F (36.6 C)  TempSrc:  Oral  SpO2: 98% 98%  Weight: 200 lb 3.2 oz (90.8 kg) 200 lb 3.2 oz (90.8 kg)  Height: 5\' 11"  (1.803 m) 6' (1.829 m)  PainSc: 0-No pain 0-No pain   Body mass index is 27.15 kg/m.     08/28/2023    8:53 AM 02/16/2022   11:04 AM 02/15/2021    9:47 AM 02/14/2020    9:47 AM 01/21/2019   11:25 AM 06/11/2018   10:41 AM 02/02/2018   11:42 AM  Advanced Directives  Does Patient Have a Medical Advance Directive? Yes No Yes Yes Yes Yes Yes  Type of Estate agent of [place];Living will;Out of facility DNR (pink MOST or yellow form)  Healthcare Power of [place];Living will Healthcare Power of [place];Living will Living will;Healthcare Power of Attorney Living will;Healthcare Power of [employer] Power of [place];Living will  Does patient want to make changes to medical advance directive?      No - Patient declined   Copy of Healthcare Power of Attorney in Chart?   No - copy requested No - copy requested No - copy requested No - copy requested No - copy requested    Current Medications (verified) Outpatient Encounter Medications as of 08/28/2023  Medication Sig   celecoxib (CELEBREX) 100 MG capsule Take 100 mg by mouth daily as needed.   folic acid (FOLVITE) 1 MG tablet Take 1 tablet by mouth daily.   methotrexate (RHEUMATREX) 2.5 MG tablet Take 20 mg by mouth once a week.    Tocilizumab 162 MG/0.9ML SOAJ Inject 162 mg into the skin.  Inject the contents of 1 pen (162 mg) under the skin every  fourteen (14) days.   No facility-administered encounter medications on file as of 08/28/2023.    Allergies (verified) Azathioprine, Lactose, and Penicillins   History: Past Medical History:  Diagnosis Date   Bronchitis    Chronic sinusitis    Esophagitis    Hemorrhoids    Hepatitis A    Hyperlipidemia    Hypertension    IFG (impaired fasting glucose)    Temporal arteritis (HCC) 01/2018   Past Surgical History:  Procedure Laterality Date   ACHILLES TENDON SURGERY Right 1997   ANUS SURGERY  1990's   anal fistula repair   ARTERY BIOPSY Left 02/05/2018   Procedure: BIOPSY TEMPORAL ARTERY;  Surgeon: Celso College, MD;  Location: [facility] ORS;  Service: Vascular;  Laterality: Left;   TONSILLECTOMY  1961   Family History  Problem Relation Age of Onset   Cancer Father        Colon   Hypertension Father    Hyperlipidemia Father    Ulcerative colitis Son    Social History   Socioeconomic History   Marital status: Married    Spouse name: Not on file   Number of children: Not on file   Years of education: Not on file   Highest education level: Some college, no degree  Occupational History  Occupation: retired   Tobacco Use   Smoking status: Never   Smokeless tobacco: Never  Vaping Use   Vaping status: Never Used  Substance and Sexual Activity   Alcohol use: Yes    Comment: occassional    Drug use: Yes    Types: Marijuana    Comment: daily    Sexual activity: Yes  Other Topics Concern   Not on file  Social History Narrative   Not on file   Social Drivers of Health   Financial Resource Strain: Low Risk  (08/27/2023)   Overall Financial Resource Strain (CARDIA)    Difficulty of Paying Living Expenses: Not hard at all  Food Insecurity: No Food Insecurity (08/27/2023)   Hunger Vital Sign    Worried About Running Out of Food in the Last Year: Never true    Ran Out of Food in the Last Year: Never true  Transportation Needs: No Transportation Needs (08/27/2023)    PRAPARE - Administrator, Civil Service (Medical): No    Lack of Transportation (Non-Medical): No  Physical Activity: Sufficiently Active (08/27/2023)   Exercise Vital Sign    Days of Exercise per Week: 5 days    Minutes of Exercise per Session: 30 min  Stress: No Stress Concern Present (08/27/2023)   [employer] of Occupational Health - Occupational Stress Questionnaire    Feeling of Stress : Not at all  Social Connections: Moderately Integrated (08/27/2023)   Social Connection and Isolation Panel [NHANES]    Frequency of Communication with Friends and Family: More than three times a week    Frequency of Social Gatherings with Friends and Family: Twice a week    Attends Religious Services: 1 to 4 times per year    Active Member of [employer] or Organizations: Yes    Attends Banker Meetings: More than 4 times per year    Marital Status: Widowed    Tobacco Counseling Counseling given: Not Answered   Clinical Intake:     Pain Score: 0-No pain                  Activities of Daily Living    08/28/2023    9:05 AM  In your present state of health, do you have any difficulty performing the following activities:  Hearing? 0  Vision? 0  Difficulty concentrating or making decisions? 1  Comment just a little bit with going into a room  Walking or climbing stairs? 0  Dressing or bathing? 0  Doing errands, shopping? 0    Patient Care Team: Solomon Dupre, DO as PCP - General (Family Medicine)  Indicate any recent Medical Services you may have received from other than [facility] providers in the past year (date may be approximate).     Assessment:   This is a routine wellness examination for Shane Duncan.  Hearing/Vision screen No results found.   Goals Addressed   None   Depression Screen    08/28/2023    9:07 AM 08/18/2022   11:23 AM 02/16/2022   11:08 AM 01/11/2022    8:48 AM 10/11/2021    1:13 PM 02/15/2021    9:48 AM 09/29/2020    3:17 PM   PHQ 2/9 Scores  PHQ - 2 Score 0 0 0 0 0 0 0  PHQ- 9 Score 0 0 0 0 0      Fall Risk    08/28/2023    9:05 AM 08/18/2022   11:24 AM 02/17/2022  8:22 AM 02/16/2022   11:07 AM 01/11/2022    8:48 AM  Fall Risk   Falls in the past year? 1 0 0 0 0  Number falls in past yr: 0 0 0 0 0  Injury with Fall? 1 0 0 0 0  Risk for fall due to : History of fall(s) No Fall Risks No Fall Risks  No Fall Risks  Follow up Falls prevention discussed Falls evaluation completed Falls evaluation completed Falls evaluation completed;Education provided;Falls prevention discussed Falls evaluation completed    MEDICARE RISK AT HOME: Medicare Risk at Home Any stairs in or around the home?: Yes If so, are there any without handrails?: Yes Home free of loose throw rugs in walkways, pet beds, electrical cords, etc?: Yes Adequate lighting in your home to reduce risk of falls?: Yes Life alert?: No Use of a cane, walker or w/c?: No Grab bars in the bathroom?: No Shower chair or bench in shower?: No Elevated toilet seat or a handicapped toilet?: Yes  TIMED UP AND GO:  Was the test performed?  Yes  Length of time to ambulate 10 feet: 8 sec Gait steady and fast without use of assistive device    Cognitive Function:        08/28/2023    8:51 AM 02/16/2022   11:05 AM 02/15/2021    9:51 AM 02/14/2020    9:51 AM  6CIT Screen  What Year? 0 points 0 points 0 points 0 points  What month? 0 points 0 points 0 points 0 points  What time? 0 points 0 points 0 points 0 points  Count back from 20 0 points 0 points 0 points 0 points  Months in reverse 0 points 0 points 0 points 0 points  Repeat phrase 0 points 0 points 2 points 0 points  Total Score 0 points 0 points 2 points 0 points    Immunizations Immunization History  Administered Date(s) Administered   Fluad Quad(high Dose 65+) 01/16/2019   PFIZER Comirnaty(Gray Top)Covid-19 Tri-Sucrose Vaccine 07/30/2020   PFIZER(Purple Top)SARS-COV-2 Vaccination  05/28/2019, 06/18/2019, 01/20/2020, 08/04/2020   Pfizer Covid-19 Vaccine Bivalent Booster 94yrs & up 02/04/2021   Pneumococcal Conjugate-13 01/16/2019   Pneumococcal Polysaccharide-23 04/09/2020   Tdap 12/30/2010    TDAP status: Completed at today's visit  Flu Vaccine status: Up to date  Pneumococcal vaccine status: Up to date  Covid-19 vaccine status: Declined, Education has been provided regarding the importance of this vaccine but patient still declined. Advised may receive this vaccine at local pharmacy or Health Dept.or vaccine clinic. Aware to provide a copy of the vaccination record if obtained from local pharmacy or Health Dept. Verbalized acceptance and understanding.  Qualifies for Shingles Vaccine? Yes   Zostavax completed No     Screening Tests Health Maintenance  Topic Date Due   Zoster Vaccines- Shingrix (1 of 2) Never done   DTaP/Tdap/Td (2 - Td or Tdap) 12/29/2020   INFLUENZA VACCINE  11/17/2023   Colonoscopy  01/10/2024   Medicare Annual Wellness (AWV)  08/27/2024   Pneumonia Vaccine 60+ Years old  Completed   Hepatitis C Screening  Completed   HPV VACCINES  Aged Out   Meningococcal B Vaccine  Aged Out   COVID-19 Vaccine  Discontinued    Health Maintenance  Health Maintenance Due  Topic Date Due   Zoster Vaccines- Shingrix (1 of 2) Never done   DTaP/Tdap/Td (2 - Td or Tdap) 12/29/2020    Colorectal cancer screening: Type of screening: Colonoscopy. Completed 01/09/14.  Repeat every 10 years  Lung Cancer Screening: (Low Dose CT Chest recommended if Age 71-80 years, 20 pack-year currently smoking OR have quit w/in 15years.) does not qualify.   Additional Screening:  Hepatitis C Screening: does qualify; Completed 03/27/16  Vision Screening: Recommended annual ophthalmology exams for early detection of glaucoma and other disorders of the eye. Is the patient up to date with their annual eye exam?  Yes   Dental Screening: Recommended annual dental exams  for proper oral hygiene   Community Resource Referral / Chronic Care Management: CRR required this visit?  No   CCM required this visit?  No     Plan:     I have personally reviewed and noted the following in the patient's chart:   Medical and social history Use of alcohol, tobacco or illicit drugs  Current medications and supplements including opioid prescriptions. Patient is not currently taking opioid prescriptions. Functional ability and status Nutritional status Physical activity Advanced directives List of other physicians Hospitalizations, surgeries, and ER visits in previous 12 months Vitals Screenings to include cognitive, depression, and falls Referrals and appointments  In addition, I have reviewed and discussed with patient certain preventive protocols, quality metrics, and best practice recommendations. A written personalized care plan for preventive services as well as general preventive health recommendations were provided to patient.     Terre Ferri, DO   08/28/2023   After Visit Summary: (In Person-Printed) AVS printed and given to the patient

## 2023-08-28 NOTE — Progress Notes (Signed)
 BP (!) 142/72   Pulse 72   Temp 97.9 F (36.6 C) (Oral)   Ht 6' (1.829 m)   Wt 200 lb 3.2 oz (90.8 kg)   SpO2 98%   BMI 27.15 kg/m    Subjective:    Patient ID: Shane Duncan, male    DOB: 1950-12-17, 73 y.o.   MRN: [id number]  HPI: Shane Duncan is a 73 y.o. male  Chief Complaint  Patient presents with   Medicare Annual Wellness   HYPERTENSION  Hypertension status: stable  Satisfied with current treatment? yes Duration of hypertension: chronic BP monitoring frequency:  rarely BP medication side effects:  N/A Previous BP meds:none Aspirin: no Recurrent headaches: no Visual changes: no Palpitations: no Dyspnea: no Chest pain: no Lower extremity edema: no Dizzy/lightheaded: no  BPH BPH status: controlled Satisfied with current treatment?: N/A Duration: chronic Nocturia: 1-2x per night Urinary frequency:no Incomplete voiding: no Urgency: no Weak urinary stream: no Straining to start stream: no Dysuria: no Onset: gradual Severity: mild  Relevant past medical, surgical, family and social history reviewed and updated as indicated. Interim medical history since our last visit reviewed. Allergies and medications reviewed and updated.  Review of Systems  Constitutional: Negative.   HENT: Negative.    Eyes: Negative.   Respiratory: Negative.    Cardiovascular: Negative.   Gastrointestinal: Negative.   Endocrine: Negative.   Genitourinary: Negative.   Musculoskeletal:  Positive for arthralgias. Negative for back pain, gait problem, joint swelling, myalgias, neck pain and neck stiffness.  Skin: Negative.   Allergic/Immunologic: Negative.   Neurological: Negative.   Hematological: Negative.   Psychiatric/Behavioral: Negative.      Per HPI unless specifically indicated above     Objective:     BP (!) 142/72   Pulse 72   Temp 97.9 F (36.6 C) (Oral)   Ht 6' (1.829 m)   Wt 200 lb 3.2 oz (90.8 kg)   SpO2 98%   BMI 27.15 kg/m   Wt Readings  from Last 3 Encounters:  08/28/23 200 lb 3.2 oz (90.8 kg)  08/08/23 198 lb 6 oz (90 kg)  09/14/22 197 lb 2 oz (89.4 kg)    Physical Exam Vitals and nursing note reviewed.  Constitutional:      General: He is not in acute distress.    Appearance: Normal appearance. He is obese. He is not ill-appearing, toxic-appearing or diaphoretic.  HENT:     Head: Normocephalic and atraumatic.     Right Ear: Tympanic membrane, ear canal and external ear normal. There is no impacted cerumen.     Left Ear: Tympanic membrane, ear canal and external ear normal. There is no impacted cerumen.     Nose: Nose normal. No congestion or rhinorrhea.     Mouth/Throat:     Mouth: Mucous membranes are moist.     Pharynx: Oropharynx is clear. No oropharyngeal exudate or posterior oropharyngeal erythema.  Eyes:     General: No scleral icterus.       Right eye: No discharge.        Left eye: No discharge.     Extraocular Movements: Extraocular movements intact.     Conjunctiva/sclera: Conjunctivae normal.     Pupils: Pupils are equal, round, and reactive to light.  Neck:     Vascular: No carotid bruit.  Cardiovascular:     Rate and Rhythm: Normal rate and regular rhythm.     Pulses: Normal pulses.     Heart sounds: No murmur  heard.    No friction rub. No gallop.  Pulmonary:     Effort: Pulmonary effort is normal. No respiratory distress.     Breath sounds: Normal breath sounds. No stridor. No wheezing, rhonchi or rales.  Chest:     Chest wall: No tenderness.  Abdominal:     General: Abdomen is flat. Bowel sounds are normal. There is no distension.     Palpations: Abdomen is soft. There is no mass.     Tenderness: There is no abdominal tenderness. There is no right CVA tenderness, left CVA tenderness, guarding or rebound.     Hernia: No hernia is present.  Genitourinary:    Comments: Genital exam deferred with shared decision making Musculoskeletal:        General: No swelling, tenderness, deformity or  signs of injury.     Cervical back: Normal range of motion and neck supple. No rigidity. No muscular tenderness.     Right lower leg: No edema.     Left lower leg: No edema.  Lymphadenopathy:     Cervical: No cervical adenopathy.  Skin:    General: Skin is warm and dry.     Capillary Refill: Capillary refill takes less than 2 seconds.     Coloration: Skin is not jaundiced or pale.     Findings: No bruising, erythema, lesion or rash.  Neurological:     General: No focal deficit present.     Mental Status: He is alert and oriented to person, place, and time.     Cranial Nerves: No cranial nerve deficit.     Sensory: No sensory deficit.     Motor: No weakness.     Coordination: Coordination normal.     Gait: Gait normal.     Deep Tendon Reflexes: Reflexes normal.  Psychiatric:        Mood and Affect: Mood normal.        Behavior: Behavior normal.        Thought Content: Thought content normal.        Judgment: Judgment normal.     Results for orders placed or performed in visit on 07/26/23  PSA   Collection Time: 07/26/23 11:15 AM  Result Value Ref Range   Prostate Specific Ag, Serum 4.7 (H) 0.0 - 4.0 ng/mL      Assessment & Plan:   Problem List Items Addressed This Visit       Cardiovascular and Mediastinum   Hypertension   Better on recheck. Would like to avoid medication at this time. Continue diet and exercise. Call with any concerns.       Relevant Orders   TSH   Microalbumin, Urine Waived     Genitourinary   BPH (benign prostatic hyperplasia)   Under good control on current regimen. Continue current regimen. Continue to monitor. Call with any concerns. Labs drawn today.        Relevant Orders   PSA   Other Visit Diagnoses       Encounter for Medicare annual wellness exam    -  Primary   Preventative care discussed today as below.     Screening for cardiovascular condition       Labs drawn today. Await results.     Open wound of right upper arm,  initial encounter       Healing well. Due for Td. Given today.   Relevant Orders   Td : Tetanus/diphtheria >7yo Preservative  free (Completed)  Follow up plan: Return in about 6 months (around 02/28/2024).

## 2023-08-28 NOTE — Patient Instructions (Signed)
  Shane Duncan , Thank you for taking time to come for your Medicare Wellness Visit. I appreciate your ongoing commitment to your health goals. Please review the following plan we discussed and let me know if I can assist you in the future.   These are the goals we discussed:  Goals      Patient Stated     02/14/2020, wants to weigh 200 pounds     Patient Stated     02/15/2021, wants to get stronger     Patient Stated     Maintain current lifestyle        This is a list of the screening recommended for you and due dates:  Health Maintenance  Topic Date Due   Zoster (Shingles) Vaccine (1 of 2) Never done   DTaP/Tdap/Td vaccine (2 - Td or Tdap) 12/29/2020   Flu Shot  11/17/2023   Colon Cancer Screening  01/10/2024   Medicare Annual Wellness Visit  08/27/2024   Pneumonia Vaccine  Completed   Hepatitis C Screening  Completed   HPV Vaccine  Aged Out   Meningitis B Vaccine  Aged Out   COVID-19 Vaccine  Discontinued

## 2023-08-28 NOTE — Assessment & Plan Note (Signed)
 Better on recheck. Would like to avoid medication at this time. Continue diet and exercise. Call with any concerns.

## 2023-08-29 ENCOUNTER — Ambulatory Visit: Payer: Self-pay | Admitting: Family Medicine

## 2023-08-29 LAB — TSH: TSH: 2.54 u[IU]/mL (ref 0.450–4.500)

## 2023-08-29 LAB — PSA: Prostate Specific Ag, Serum: 4.5 ng/mL — ABNORMAL HIGH (ref 0.0–4.0)

## 2023-12-20 IMAGING — US US ABDOMEN LIMITED
1 series · 14 of 25 positions shown · non-contrast
Comparison: None.

CLINICAL DATA: Right upper quadrant abdominal pain

EXAM:
ULTRASOUND ABDOMEN LIMITED RIGHT UPPER QUADRANT

[Series 1: us abdomen limited · 0.19mm/px · 14 of 51 slices shown]
[im 1/51]
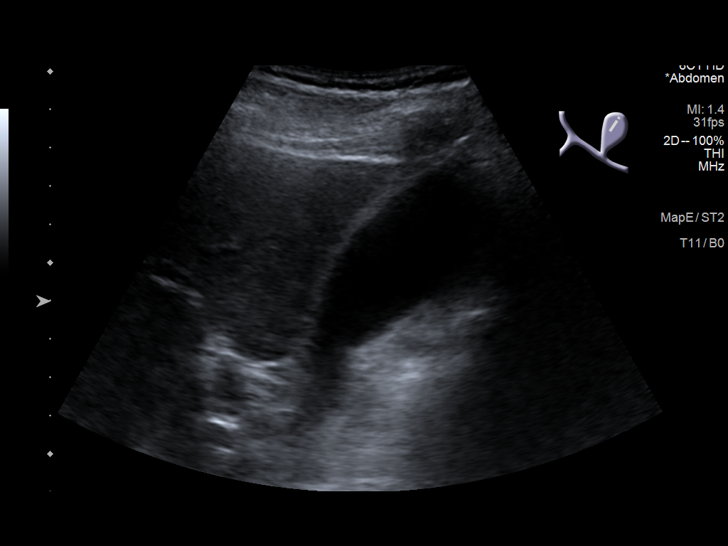
[im 5/51]
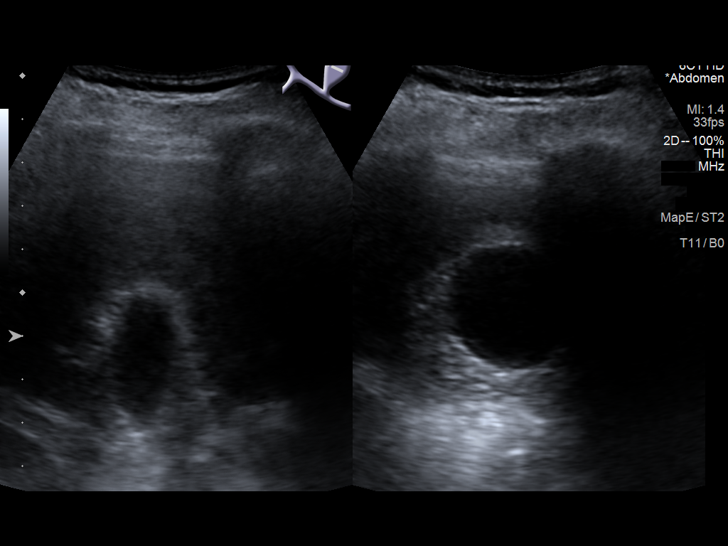
[im 9/51]
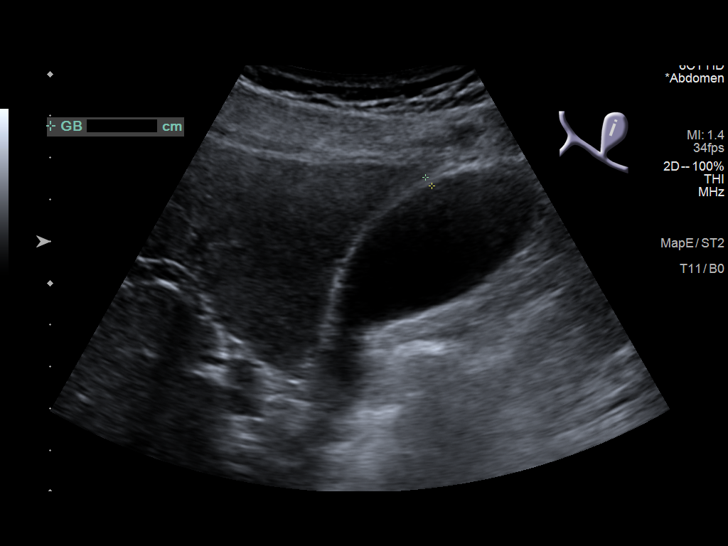
[im 13/51]
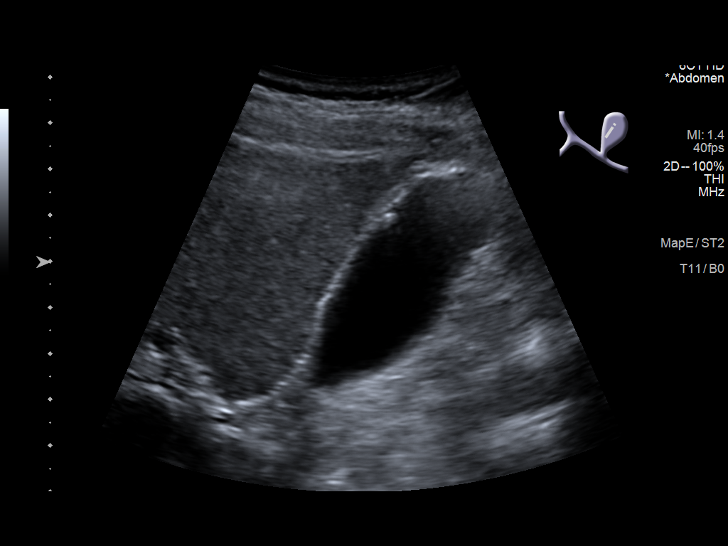
[im 17/51]
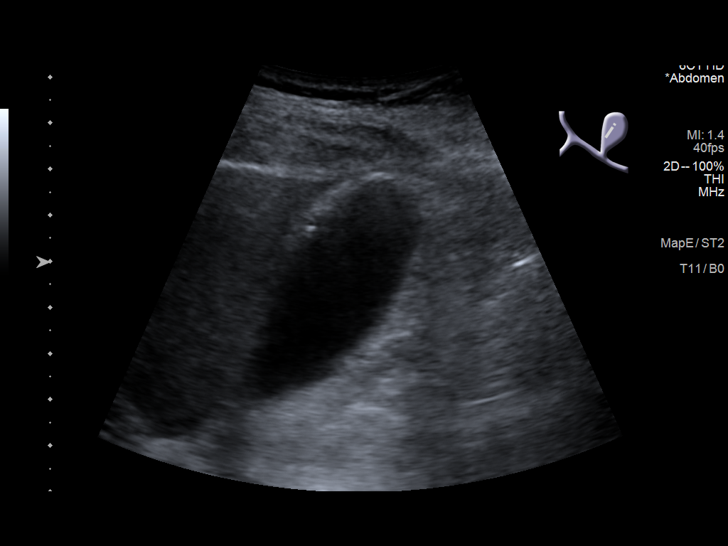
[im 19/51]
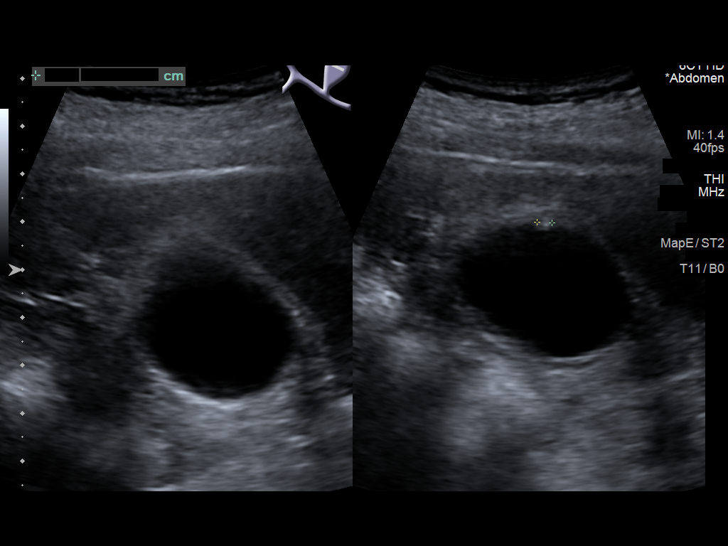
[im 23/51]
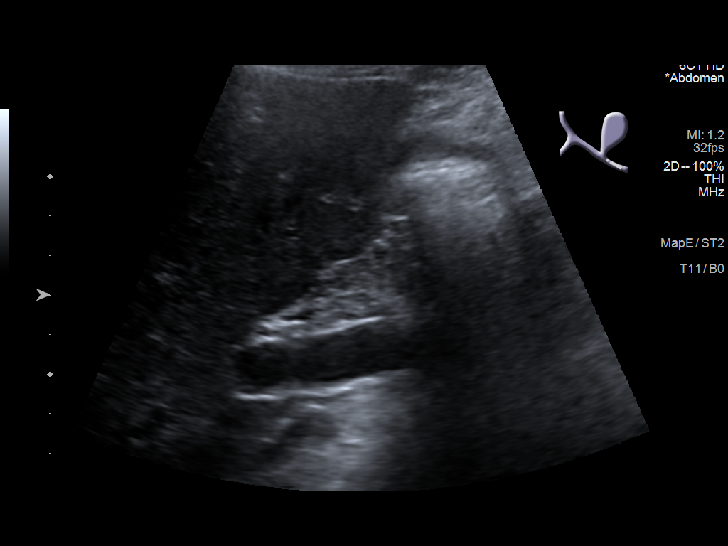
[im 28/51]
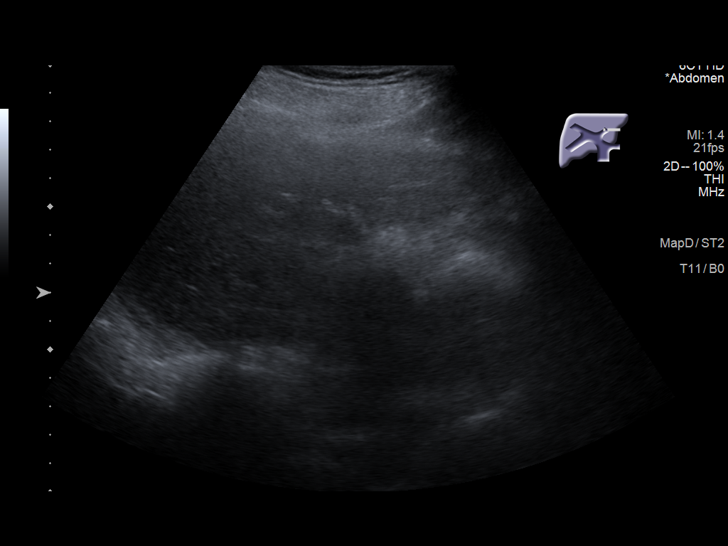
[im 32/51]
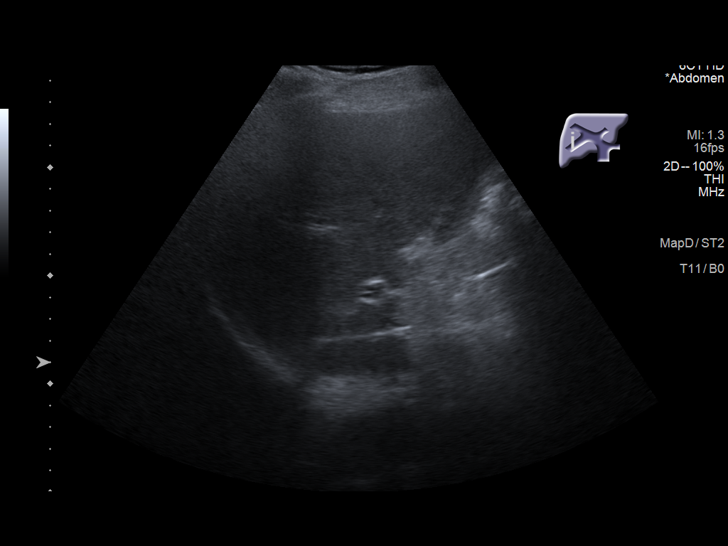
[im 34/51]
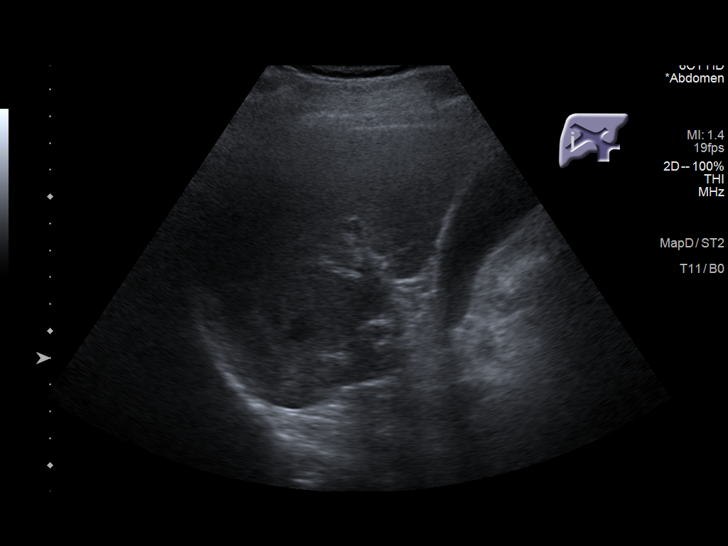
[im 38/51]
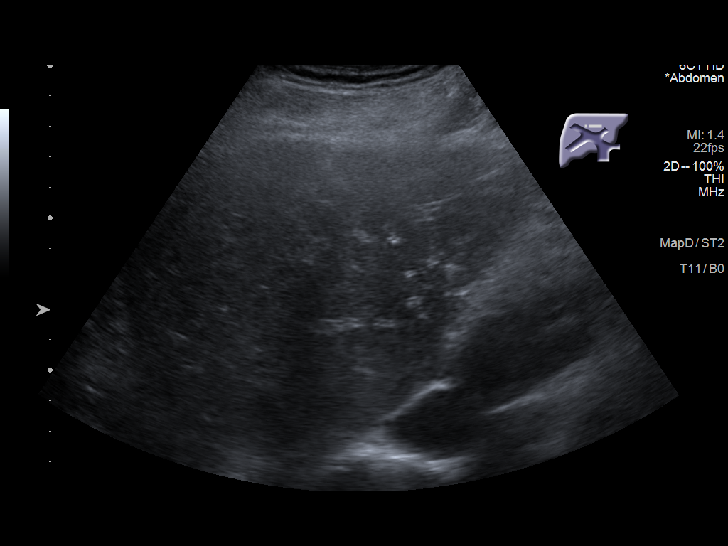
[im 42/51]
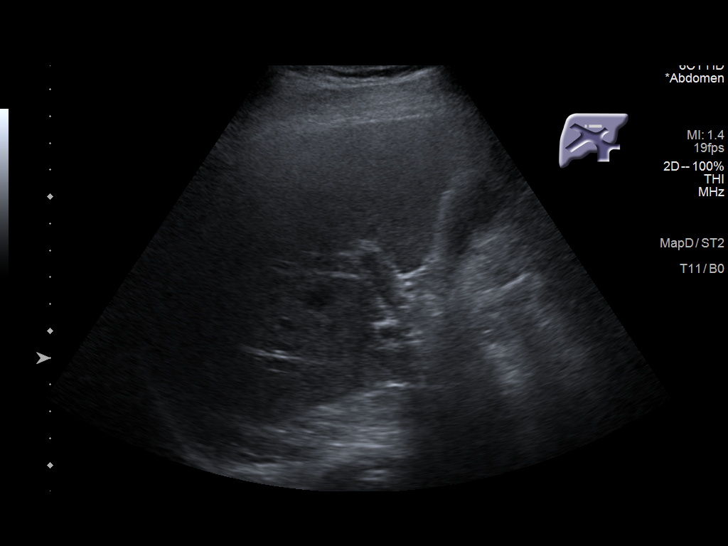
[im 46/51]
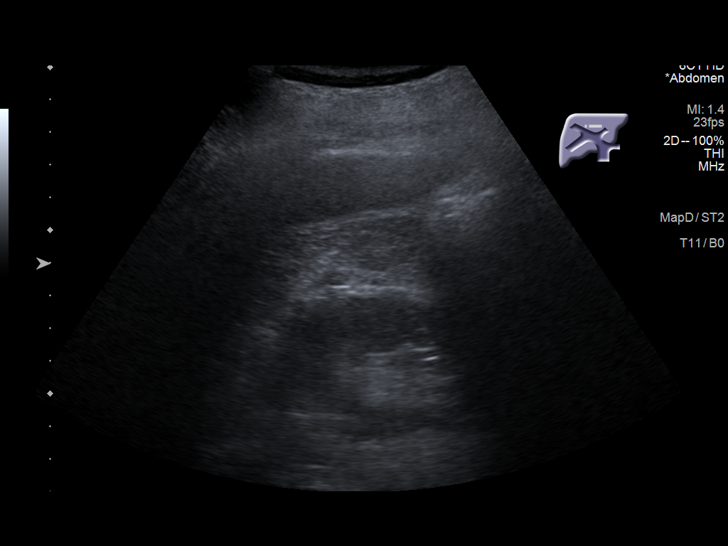
[im 51/51]
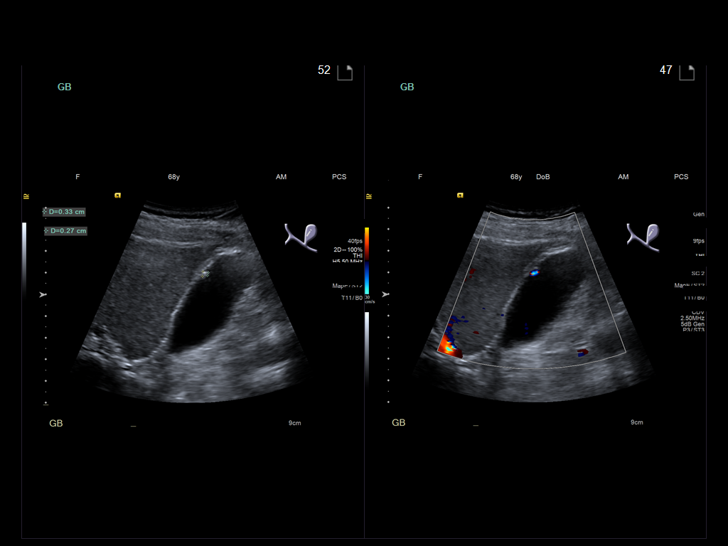

[14 of 25 positions shown; findings below may reference images not displayed]

FINDINGS: Gallbladder:

No gallstones, gallbladder wall thickening, sonographic Murphy's
sign or pericholecystic fluid. Small, non-mobile echogenic nodule
along the anterior wall of the gallbladder measures 3.3 mm.

Common bile duct:

Diameter: 3.1 mm

Liver:

No focal lesion identified. Within normal limits in parenchymal
echogenicity. Portal vein is patent on color Doppler imaging with
normal direction of blood flow towards the liver.

Other: None.
IMPRESSION: 1. No acute findings. No evidence for cholecystitis.
2. Gallbladder polyp.

## 2023-12-22 DIAGNOSIS — D2272 Melanocytic nevi of left lower limb, including hip: Secondary | ICD-10-CM | POA: Diagnosis not present

## 2023-12-22 DIAGNOSIS — D2271 Melanocytic nevi of right lower limb, including hip: Secondary | ICD-10-CM | POA: Diagnosis not present

## 2023-12-22 DIAGNOSIS — L821 Other seborrheic keratosis: Secondary | ICD-10-CM | POA: Diagnosis not present

## 2023-12-22 DIAGNOSIS — D485 Neoplasm of uncertain behavior of skin: Secondary | ICD-10-CM | POA: Diagnosis not present

## 2023-12-22 DIAGNOSIS — L570 Actinic keratosis: Secondary | ICD-10-CM | POA: Diagnosis not present

## 2023-12-22 DIAGNOSIS — L820 Inflamed seborrheic keratosis: Secondary | ICD-10-CM | POA: Diagnosis not present

## 2023-12-22 DIAGNOSIS — D2261 Melanocytic nevi of right upper limb, including shoulder: Secondary | ICD-10-CM | POA: Diagnosis not present

## 2023-12-22 DIAGNOSIS — D225 Melanocytic nevi of trunk: Secondary | ICD-10-CM | POA: Diagnosis not present

## 2023-12-22 DIAGNOSIS — D2262 Melanocytic nevi of left upper limb, including shoulder: Secondary | ICD-10-CM | POA: Diagnosis not present

## 2024-01-05 DIAGNOSIS — M0600 Rheumatoid arthritis without rheumatoid factor, unspecified site: Secondary | ICD-10-CM | POA: Diagnosis not present

## 2024-01-05 DIAGNOSIS — M316 Other giant cell arteritis: Secondary | ICD-10-CM | POA: Diagnosis not present

## 2024-01-05 DIAGNOSIS — Z1159 Encounter for screening for other viral diseases: Secondary | ICD-10-CM | POA: Diagnosis not present

## 2024-01-05 DIAGNOSIS — B181 Chronic viral hepatitis B without delta-agent: Secondary | ICD-10-CM | POA: Diagnosis not present

## 2024-02-28 ENCOUNTER — Encounter: Payer: Self-pay | Admitting: Family Medicine

## 2024-02-28 ENCOUNTER — Ambulatory Visit (INDEPENDENT_AMBULATORY_CARE_PROVIDER_SITE_OTHER): Admitting: Family Medicine

## 2024-02-28 VITALS — BP 184/90 | HR 63 | Temp 98.0°F | Ht 72.0 in | Wt 193.0 lb

## 2024-02-28 DIAGNOSIS — R351 Nocturia: Secondary | ICD-10-CM

## 2024-02-28 DIAGNOSIS — N401 Enlarged prostate with lower urinary tract symptoms: Secondary | ICD-10-CM

## 2024-02-28 DIAGNOSIS — Z1211 Encounter for screening for malignant neoplasm of colon: Secondary | ICD-10-CM | POA: Diagnosis not present

## 2024-02-28 DIAGNOSIS — I10 Essential (primary) hypertension: Secondary | ICD-10-CM | POA: Diagnosis not present

## 2024-02-28 MED ORDER — LOSARTAN POTASSIUM 25 MG PO TABS: 12.5000 mg | ORAL_TABLET | Freq: Every day | ORAL | 0 refills | Status: DC

## 2024-02-28 NOTE — Assessment & Plan Note (Signed)
 Doing well not on medicine. Will recheck labs today. Await results. Treat as needed.

## 2024-02-28 NOTE — Assessment & Plan Note (Signed)
 Not under good control. Will restart losartan  and recheck in 1 month. Call with any concerns.

## 2024-02-28 NOTE — Progress Notes (Signed)
 BP (!) 184/90   Pulse 63   Temp 98 F (36.7 C) (Oral)   Ht 6' (1.829 m)   Wt 193 lb (87.5 kg)   SpO2 98%   BMI 26.18 kg/m    Subjective:    Patient ID: Shane Duncan, male    DOB: 1950-06-24, 73 y.o.   MRN: [id number]  HPI: Shane Duncan is a 73 y.o. male  Chief Complaint  Patient presents with   Hypertension   HYPERTENSION  Hypertension status: uncontrolled  Satisfied with current treatment? no Duration of hypertension: chronic BP monitoring frequency:  not checking BP medication side effects:  no Medication compliance:  Previous BP meds: losartan  Aspirin: no Recurrent headaches: no Visual changes: no Palpitations: no Dyspnea: no Chest pain: no Lower extremity edema: no Dizzy/lightheaded: no  BPH BPH status: stable Satisfied with current treatment?: yes Medication side effects: N/A Duration: chronic Nocturia: 1-2x per night Urinary frequency:no Incomplete voiding: no Urgency: no Weak urinary stream: no Straining to start stream: no Dysuria: no Onset: gradual Severity: mild  Relevant past medical, surgical, family and social history reviewed and updated as indicated. Interim medical history since our last visit reviewed. Allergies and medications reviewed and updated.  Review of Systems  Constitutional: Negative.   Respiratory: Negative.    Cardiovascular: Negative.   Musculoskeletal:  Positive for arthralgias and myalgias. Negative for back pain, gait problem, joint swelling, neck pain and neck stiffness.  Psychiatric/Behavioral: Negative.      Per HPI unless specifically indicated above     Objective:    BP (!) 184/90   Pulse 63   Temp 98 F (36.7 C) (Oral)   Ht 6' (1.829 m)   Wt 193 lb (87.5 kg)   SpO2 98%   BMI 26.18 kg/m   Wt Readings from Last 3 Encounters:  02/28/24 193 lb (87.5 kg)  08/28/23 200 lb 3.2 oz (90.8 kg)  08/08/23 198 lb 6 oz (90 kg)    Physical Exam Vitals and nursing note reviewed.  Constitutional:       General: He is not in acute distress.    Appearance: Normal appearance. He is not ill-appearing, toxic-appearing or diaphoretic.  HENT:     Head: Normocephalic and atraumatic.     Right Ear: External ear normal.     Left Ear: External ear normal.     Nose: Nose normal.     Mouth/Throat:     Mouth: Mucous membranes are moist.     Pharynx: Oropharynx is clear.  Eyes:     General: No scleral icterus.       Right eye: No discharge.        Left eye: No discharge.     Extraocular Movements: Extraocular movements intact.     Conjunctiva/sclera: Conjunctivae normal.     Pupils: Pupils are equal, round, and reactive to light.  Cardiovascular:     Rate and Rhythm: Normal rate and regular rhythm.     Pulses: Normal pulses.     Heart sounds: Normal heart sounds. No murmur heard.    No friction rub. No gallop.  Pulmonary:     Effort: Pulmonary effort is normal. No respiratory distress.     Breath sounds: Normal breath sounds. No stridor. No wheezing, rhonchi or rales.  Chest:     Chest wall: No tenderness.  Musculoskeletal:        General: Normal range of motion.     Cervical back: Normal range of motion and neck supple.  Skin:    General: Skin is warm and dry.     Capillary Refill: Capillary refill takes less than 2 seconds.     Coloration: Skin is not jaundiced or pale.     Findings: No bruising, erythema, lesion or rash.  Neurological:     General: No focal deficit present.     Mental Status: He is alert and oriented to person, place, and time. Mental status is at baseline.  Psychiatric:        Mood and Affect: Mood normal.        Behavior: Behavior normal.        Thought Content: Thought content normal.        Judgment: Judgment normal.     Results for orders placed or performed in visit on 08/28/23  PSA   Collection Time: 08/28/23  9:19 AM  Result Value Ref Range   Prostate Specific Ag, Serum 4.5 (H) 0.0 - 4.0 ng/mL  TSH   Collection Time: 08/28/23  9:19 AM  Result Value  Ref Range   TSH 2.540 0.450 - 4.500 uIU/mL  Microalbumin, Urine Waived   Collection Time: 08/28/23  9:20 AM  Result Value Ref Range   Microalb, Ur Waived 10 0 - 19 mg/L   Creatinine, Urine Waived 200 10 - 300 mg/dL   Microalb/Creat Ratio <30 <30 mg/g      Assessment & Plan:   Problem List Items Addressed This Visit       Cardiovascular and Mediastinum   Hypertension - Primary   Not under good control. Will restart losartan  and recheck in 1 month. Call with any concerns.       Relevant Medications   losartan  (COZAAR ) 25 MG tablet   Other Relevant Orders   Basic metabolic panel with GFR     Genitourinary   BPH (benign prostatic hyperplasia)   Doing well not on medicine. Will recheck labs today. Await results. Treat as needed.       Relevant Orders   PSA   Other Visit Diagnoses       Screening for colon cancer       Cologuard ordered today.   Relevant Orders   Cologuard        Follow up plan: Return in about 4 weeks (around 03/27/2024).

## 2024-02-29 LAB — BASIC METABOLIC PANEL WITH GFR
BUN/Creatinine Ratio: 16 (ref 10–24)
BUN: 12 mg/dL (ref 8–27)
CO2: 23 mmol/L (ref 20–29)
Calcium: 9.4 mg/dL (ref 8.6–10.2)
Chloride: 104 mmol/L (ref 96–106)
Creatinine, Ser: 0.77 mg/dL (ref 0.76–1.27)
Glucose: 79 mg/dL (ref 70–99)
Potassium: 4 mmol/L (ref 3.5–5.2)
Sodium: 138 mmol/L (ref 134–144)
eGFR: 96 mL/min/1.73 (ref >59)

## 2024-02-29 LAB — PSA: Prostate Specific Ag, Serum: 4.7 ng/mL — ABNORMAL HIGH (ref 0.0–4.0)

## 2024-03-11 ENCOUNTER — Ambulatory Visit: Payer: Self-pay | Admitting: Family Medicine

## 2024-03-11 DIAGNOSIS — Z1211 Encounter for screening for malignant neoplasm of colon: Secondary | ICD-10-CM | POA: Diagnosis not present

## 2024-03-18 LAB — COLOGUARD: COLOGUARD: NEGATIVE

## 2024-03-27 ENCOUNTER — Encounter: Payer: Self-pay | Admitting: Family Medicine

## 2024-03-27 ENCOUNTER — Ambulatory Visit (INDEPENDENT_AMBULATORY_CARE_PROVIDER_SITE_OTHER): Admitting: Family Medicine

## 2024-03-27 VITALS — BP 148/72 | HR 72 | Temp 98.0°F | Ht 72.0 in | Wt 198.2 lb

## 2024-03-27 DIAGNOSIS — I10 Essential (primary) hypertension: Secondary | ICD-10-CM | POA: Diagnosis not present

## 2024-03-27 MED ORDER — LOSARTAN POTASSIUM 25 MG PO TABS: 12.5000 mg | ORAL_TABLET | Freq: Every day | ORAL | 1 refills | Status: AC

## 2024-03-27 NOTE — Assessment & Plan Note (Signed)
 Running slightly high here, but good at home. Will continue current regimen and continue to monitor. Refills given. Labs drawn today. Recheck May for physical. Call with any concerns.

## 2024-03-27 NOTE — Progress Notes (Signed)
 BP (!) 148/72   Pulse 72   Temp 98 F (36.7 C) (Oral)   Ht 6' (1.829 m)   Wt 198 lb 3.2 oz (89.9 kg)   SpO2 98%   BMI 26.88 kg/m    Subjective:    Patient ID: Shane Duncan, male    DOB: 1951-03-30, 73 y.o.   MRN: [id number]  HPI: Shane Duncan is a 73 y.o. male  Chief Complaint  Patient presents with   Hypertension    BP avg @ home 128s   HYPERTENSION  Hypertension status: better  Satisfied with current treatment? yes Duration of hypertension: chronic BP monitoring frequency:  a few times a week BP range: 120s-130s/70s BP medication side effects:  no Medication compliance: excellent compliance Previous BP meds: losartan  Aspirin: no Recurrent headaches: no Visual changes: no Palpitations: no Dyspnea: no Chest pain: no Lower extremity edema: no Dizzy/lightheaded: no  Relevant past medical, surgical, family and social history reviewed and updated as indicated. Interim medical history since our last visit reviewed. Allergies and medications reviewed and updated.  Review of Systems  Constitutional: Negative.   Respiratory: Negative.    Cardiovascular: Negative.   Musculoskeletal: Negative.   Neurological: Negative.   Psychiatric/Behavioral: Negative.      Per HPI unless specifically indicated above     Objective:    BP (!) 148/72   Pulse 72   Temp 98 F (36.7 C) (Oral)   Ht 6' (1.829 m)   Wt 198 lb 3.2 oz (89.9 kg)   SpO2 98%   BMI 26.88 kg/m   Wt Readings from Last 3 Encounters:  03/27/24 198 lb 3.2 oz (89.9 kg)  02/28/24 193 lb (87.5 kg)  08/28/23 200 lb 3.2 oz (90.8 kg)    Physical Exam Vitals and nursing note reviewed.  Constitutional:      General: He is not in acute distress.    Appearance: Normal appearance. He is not ill-appearing, toxic-appearing or diaphoretic.  HENT:     Head: Normocephalic and atraumatic.     Right Ear: External ear normal.     Left Ear: External ear normal.     Nose: Nose normal.     Mouth/Throat:      Mouth: Mucous membranes are moist.     Pharynx: Oropharynx is clear.  Eyes:     General: No scleral icterus.       Right eye: No discharge.        Left eye: No discharge.     Extraocular Movements: Extraocular movements intact.     Conjunctiva/sclera: Conjunctivae normal.     Pupils: Pupils are equal, round, and reactive to light.  Cardiovascular:     Rate and Rhythm: Normal rate and regular rhythm.     Pulses: Normal pulses.     Heart sounds: Normal heart sounds. No murmur heard.    No friction rub. No gallop.  Pulmonary:     Effort: Pulmonary effort is normal. No respiratory distress.     Breath sounds: Normal breath sounds. No stridor. No wheezing, rhonchi or rales.  Chest:     Chest wall: No tenderness.  Musculoskeletal:        General: Normal range of motion.     Cervical back: Normal range of motion and neck supple.  Skin:    General: Skin is warm and dry.     Capillary Refill: Capillary refill takes less than 2 seconds.     Coloration: Skin is not jaundiced or pale.  Findings: No bruising, erythema, lesion or rash.  Neurological:     General: No focal deficit present.     Mental Status: He is alert and oriented to person, place, and time. Mental status is at baseline.  Psychiatric:        Mood and Affect: Mood normal.        Behavior: Behavior normal.        Thought Content: Thought content normal.        Judgment: Judgment normal.     Results for orders placed or performed in visit on 02/28/24  Basic metabolic panel with GFR   Collection Time: 02/28/24 10:53 AM  Result Value Ref Range   Glucose 79 70 - 99 mg/dL   BUN 12 8 - 27 mg/dL   Creatinine, Ser 9.22 0.76 - 1.27 mg/dL   eGFR 96 >40 fO/fpw/8.26   BUN/Creatinine Ratio 16 10 - 24   Sodium 138 134 - 144 mmol/L   Potassium 4.0 3.5 - 5.2 mmol/L   Chloride 104 96 - 106 mmol/L   CO2 23 20 - 29 mmol/L   Calcium 9.4 8.6 - 10.2 mg/dL  PSA   Collection Time: 02/28/24 10:53 AM  Result Value Ref Range    Prostate Specific Ag, Serum 4.7 (H) 0.0 - 4.0 ng/mL  Cologuard   Collection Time: 03/11/24 10:03 AM  Result Value Ref Range   COLOGUARD Negative Negative      Assessment & Plan:   Problem List Items Addressed This Visit       Cardiovascular and Mediastinum   Hypertension - Primary   Running slightly high here, but good at home. Will continue current regimen and continue to monitor. Refills given. Labs drawn today. Recheck May for physical. Call with any concerns.       Relevant Medications   losartan  (COZAAR ) 25 MG tablet   Other Relevant Orders   Basic metabolic panel with GFR     Follow up plan: Return in about 5 months (around 08/25/2024) for physical.

## 2024-03-28 ENCOUNTER — Ambulatory Visit: Payer: Self-pay | Admitting: Family Medicine

## 2024-03-28 LAB — BASIC METABOLIC PANEL WITH GFR
BUN/Creatinine Ratio: 14 (ref 10–24)
BUN: 12 mg/dL (ref 8–27)
CO2: 23 mmol/L (ref 20–29)
Calcium: 9.2 mg/dL (ref 8.6–10.2)
Chloride: 106 mmol/L (ref 96–106)
Creatinine, Ser: 0.84 mg/dL (ref 0.76–1.27)
Glucose: 86 mg/dL (ref 70–99)
Potassium: 4.1 mmol/L (ref 3.5–5.2)
Sodium: 142 mmol/L (ref 134–144)
eGFR: 93 mL/min/1.73 (ref >59)

## 2024-08-29 ENCOUNTER — Encounter: Admitting: Family Medicine
